# Patient Record
Sex: Male | Born: 1955
Health system: Southern US, Community
[De-identification: ages and names within clinical notes are randomized; demographics above are authoritative.]

## PROBLEM LIST (undated history)

## (undated) ENCOUNTER — Emergency Department (HOSPITAL_BASED_OUTPATIENT_CLINIC_OR_DEPARTMENT_OTHER): Admission: EM | Payer: PPO

## (undated) DIAGNOSIS — E785 Hyperlipidemia, unspecified: Secondary | ICD-10-CM

## (undated) DIAGNOSIS — I10 Essential (primary) hypertension: Secondary | ICD-10-CM

## (undated) DIAGNOSIS — K861 Other chronic pancreatitis: Secondary | ICD-10-CM

## (undated) DIAGNOSIS — I714 Abdominal aortic aneurysm, without rupture, unspecified: Secondary | ICD-10-CM

## (undated) DIAGNOSIS — K219 Gastro-esophageal reflux disease without esophagitis: Secondary | ICD-10-CM

## (undated) DIAGNOSIS — T7840XA Allergy, unspecified, initial encounter: Secondary | ICD-10-CM

## (undated) DIAGNOSIS — J309 Allergic rhinitis, unspecified: Secondary | ICD-10-CM

## (undated) DIAGNOSIS — F1910 Other psychoactive substance abuse, uncomplicated: Secondary | ICD-10-CM

## (undated) DIAGNOSIS — F419 Anxiety disorder, unspecified: Secondary | ICD-10-CM

## (undated) DIAGNOSIS — Z86010 Personal history of colonic polyps: Secondary | ICD-10-CM

## (undated) DIAGNOSIS — B182 Chronic viral hepatitis C: Secondary | ICD-10-CM

## (undated) DIAGNOSIS — E291 Testicular hypofunction: Secondary | ICD-10-CM

## (undated) DIAGNOSIS — Z860101 Personal history of adenomatous and serrated colon polyps: Secondary | ICD-10-CM

## (undated) DIAGNOSIS — M1990 Unspecified osteoarthritis, unspecified site: Secondary | ICD-10-CM

## (undated) DIAGNOSIS — N390 Urinary tract infection, site not specified: Secondary | ICD-10-CM

## (undated) HISTORY — DX: Allergy, unspecified, initial encounter: T78.40XA

## (undated) HISTORY — DX: Hyperlipidemia, unspecified: E78.5

## (undated) HISTORY — DX: Personal history of colonic polyps: Z86.010

## (undated) HISTORY — DX: Essential (primary) hypertension: I10

## (undated) HISTORY — DX: Allergic rhinitis, unspecified: J30.9

## (undated) HISTORY — DX: Other psychoactive substance abuse, uncomplicated: F19.10

## (undated) HISTORY — DX: Anxiety disorder, unspecified: F41.9

## (undated) HISTORY — PX: APPENDECTOMY: SHX54

## (undated) HISTORY — DX: Other chronic pancreatitis: K86.1

## (undated) HISTORY — DX: Personal history of adenomatous and serrated colon polyps: Z86.0101

## (undated) HISTORY — DX: Urinary tract infection, site not specified: N39.0

## (undated) HISTORY — DX: Chronic viral hepatitis C: B18.2

## (undated) HISTORY — DX: Unspecified osteoarthritis, unspecified site: M19.90

## (undated) HISTORY — DX: Testicular hypofunction: E29.1

---
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
## 1998-10-08 ENCOUNTER — Ambulatory Visit (HOSPITAL_COMMUNITY): Admission: RE | Admit: 1998-10-08 | Discharge: 1998-10-08 | Payer: Self-pay | Admitting: Gastroenterology

## 1998-10-25 ENCOUNTER — Encounter: Payer: Self-pay | Admitting: Gastroenterology

## 1998-10-25 ENCOUNTER — Ambulatory Visit (HOSPITAL_COMMUNITY): Admission: RE | Admit: 1998-10-25 | Discharge: 1998-10-25 | Payer: Self-pay | Admitting: Gastroenterology

## 2002-09-25 ENCOUNTER — Inpatient Hospital Stay (HOSPITAL_COMMUNITY): Admission: EM | Admit: 2002-09-25 | Discharge: 2002-09-26 | Payer: Self-pay | Admitting: Emergency Medicine

## 2002-09-25 ENCOUNTER — Encounter: Payer: Self-pay | Admitting: Emergency Medicine

## 2002-09-26 ENCOUNTER — Encounter (INDEPENDENT_AMBULATORY_CARE_PROVIDER_SITE_OTHER): Payer: Self-pay

## 2002-12-30 ENCOUNTER — Emergency Department (HOSPITAL_COMMUNITY): Admission: EM | Admit: 2002-12-30 | Discharge: 2002-12-30 | Payer: Self-pay | Admitting: Emergency Medicine

## 2003-05-30 ENCOUNTER — Inpatient Hospital Stay (HOSPITAL_COMMUNITY): Admission: EM | Admit: 2003-05-30 | Discharge: 2003-06-01 | Payer: Self-pay | Admitting: Emergency Medicine

## 2003-05-31 HISTORY — PX: NM MYOVIEW LTD: HXRAD82

## 2003-06-01 HISTORY — PX: LEFT HEART CATH AND CORONARY ANGIOGRAPHY: CATH118249

## 2003-06-05 ENCOUNTER — Encounter: Admission: RE | Admit: 2003-06-05 | Discharge: 2003-06-05 | Payer: Self-pay | Admitting: Cardiology

## 2003-06-19 ENCOUNTER — Encounter: Payer: Self-pay | Admitting: Internal Medicine

## 2004-07-11 ENCOUNTER — Encounter: Admission: RE | Admit: 2004-07-11 | Discharge: 2004-07-11 | Payer: Self-pay | Admitting: Family Medicine

## 2004-09-12 ENCOUNTER — Ambulatory Visit (HOSPITAL_COMMUNITY): Admission: RE | Admit: 2004-09-12 | Discharge: 2004-09-12 | Payer: Self-pay | Admitting: *Deleted

## 2004-09-12 ENCOUNTER — Encounter: Payer: Self-pay | Admitting: Internal Medicine

## 2005-01-10 ENCOUNTER — Ambulatory Visit (HOSPITAL_COMMUNITY): Admission: RE | Admit: 2005-01-10 | Discharge: 2005-01-10 | Payer: Self-pay | Admitting: *Deleted

## 2005-01-10 ENCOUNTER — Encounter (INDEPENDENT_AMBULATORY_CARE_PROVIDER_SITE_OTHER): Payer: Self-pay | Admitting: *Deleted

## 2005-12-31 ENCOUNTER — Ambulatory Visit: Payer: Self-pay | Admitting: Family Medicine

## 2006-08-13 DIAGNOSIS — F419 Anxiety disorder, unspecified: Secondary | ICD-10-CM | POA: Insufficient documentation

## 2006-08-13 DIAGNOSIS — F411 Generalized anxiety disorder: Secondary | ICD-10-CM

## 2006-08-13 DIAGNOSIS — B182 Chronic viral hepatitis C: Secondary | ICD-10-CM | POA: Insufficient documentation

## 2006-08-19 ENCOUNTER — Ambulatory Visit: Payer: Self-pay | Admitting: Family Medicine

## 2006-08-19 LAB — CONVERTED CEMR LAB
BUN: 11 mg/dL (ref 6–23)
CO2: 31 meq/L (ref 19–32)
Calcium: 9.5 mg/dL (ref 8.4–10.5)
Chloride: 99 meq/L (ref 96–112)
GFR calc non Af Amer: 109 mL/min
Glucose, Bld: 122 mg/dL — ABNORMAL HIGH (ref 70–99)
Ketones, ur: NEGATIVE mg/dL
Nitrite: NEGATIVE
Specific Gravity, Urine: 1.013 (ref 1.005–1.03)
Urobilinogen, UA: 0.2 (ref 0.0–1.0)
pH: 6 (ref 5.0–8.0)

## 2006-09-04 ENCOUNTER — Encounter (INDEPENDENT_AMBULATORY_CARE_PROVIDER_SITE_OTHER): Payer: Self-pay | Admitting: Family Medicine

## 2006-09-04 ENCOUNTER — Ambulatory Visit: Payer: Self-pay | Admitting: Family Medicine

## 2006-09-04 ENCOUNTER — Encounter: Payer: Self-pay | Admitting: Family Medicine

## 2006-09-04 DIAGNOSIS — E785 Hyperlipidemia, unspecified: Secondary | ICD-10-CM | POA: Insufficient documentation

## 2006-09-04 DIAGNOSIS — I10 Essential (primary) hypertension: Secondary | ICD-10-CM | POA: Insufficient documentation

## 2006-09-15 LAB — CONVERTED CEMR LAB
Cholesterol: 192 mg/dL (ref 0–200)
HDL: 34.5 mg/dL — ABNORMAL LOW (ref >39.0)
Triglycerides: 141 mg/dL (ref 0–149)
VLDL: 28 mg/dL (ref 0–40)

## 2007-04-29 ENCOUNTER — Ambulatory Visit: Payer: Self-pay | Admitting: Family Medicine

## 2007-04-29 LAB — CONVERTED CEMR LAB
BUN: 14 mg/dL (ref 6–23)
Cholesterol: 209 mg/dL (ref 0–200)
Direct LDL: 146.4 mg/dL
GFR calc Af Amer: 91 mL/min
GFR calc non Af Amer: 75 mL/min
HDL: 30.9 mg/dL — ABNORMAL LOW (ref >39.0)
Potassium: 4.6 meq/L (ref 3.5–5.1)
Sodium: 138 meq/L (ref 135–145)
Total CHOL/HDL Ratio: 6.8
Triglycerides: 146 mg/dL (ref 0–149)
VLDL: 29 mg/dL (ref 0–40)

## 2007-04-30 ENCOUNTER — Encounter (INDEPENDENT_AMBULATORY_CARE_PROVIDER_SITE_OTHER): Payer: Self-pay | Admitting: *Deleted

## 2007-05-24 ENCOUNTER — Ambulatory Visit: Payer: Self-pay | Admitting: Family Medicine

## 2007-06-09 ENCOUNTER — Ambulatory Visit: Payer: Self-pay | Admitting: Family Medicine

## 2007-06-18 ENCOUNTER — Telehealth (INDEPENDENT_AMBULATORY_CARE_PROVIDER_SITE_OTHER): Payer: Self-pay | Admitting: Family Medicine

## 2007-07-06 ENCOUNTER — Ambulatory Visit: Payer: Self-pay | Admitting: Family Medicine

## 2007-07-18 LAB — CONVERTED CEMR LAB
ALT: 109 units/L — ABNORMAL HIGH (ref 0–53)
AST: 100 units/L — ABNORMAL HIGH (ref 0–37)
Alkaline Phosphatase: 72 units/L (ref 39–117)
BUN: 14 mg/dL (ref 6–23)
Basophils Relative: 2 % — ABNORMAL HIGH (ref 0.0–1.0)
CO2: 36 meq/L — ABNORMAL HIGH (ref 19–32)
Calcium: 9.5 mg/dL (ref 8.4–10.5)
Chloride: 100 meq/L (ref 96–112)
Eosinophils Relative: 1.1 % (ref 0.0–5.0)
GFR calc Af Amer: 101 mL/min
GFR calc non Af Amer: 84 mL/min
Glucose, Bld: 96 mg/dL (ref 70–99)
Lymphocytes Relative: 31.7 % (ref 12.0–46.0)
Monocytes Relative: 7.5 % (ref 3.0–11.0)
Neutro Abs: 5.4 10*3/uL (ref 1.4–7.7)
Platelets: 181 10*3/uL (ref 150–400)
RBC: 5.11 M/uL (ref 4.22–5.81)
Total Protein: 8.3 g/dL (ref 6.0–8.3)
WBC: 9.3 10*3/uL (ref 4.5–10.5)

## 2007-07-19 ENCOUNTER — Telehealth (INDEPENDENT_AMBULATORY_CARE_PROVIDER_SITE_OTHER): Payer: Self-pay | Admitting: *Deleted

## 2007-10-12 ENCOUNTER — Encounter: Admission: RE | Admit: 2007-10-12 | Discharge: 2007-10-12 | Payer: Self-pay | Admitting: Allergy and Immunology

## 2007-12-29 ENCOUNTER — Ambulatory Visit: Payer: Self-pay | Admitting: Internal Medicine

## 2008-05-24 ENCOUNTER — Telehealth (INDEPENDENT_AMBULATORY_CARE_PROVIDER_SITE_OTHER): Payer: Self-pay | Admitting: *Deleted

## 2008-08-23 ENCOUNTER — Ambulatory Visit: Payer: Self-pay | Admitting: Internal Medicine

## 2008-08-29 LAB — CONVERTED CEMR LAB
ALT: 61 units/L — ABNORMAL HIGH (ref 0–53)
BUN: 11 mg/dL (ref 6–23)
Basophils Absolute: 0 10*3/uL (ref 0.0–0.1)
Chloride: 106 meq/L (ref 96–112)
Cholesterol: 166 mg/dL (ref 0–200)
Creatinine, Ser: 0.8 mg/dL (ref 0.4–1.5)
Eosinophils Absolute: 0 10*3/uL (ref 0.0–0.7)
Glucose, Bld: 107 mg/dL — ABNORMAL HIGH (ref 70–99)
HCT: 46.8 % (ref 39.0–52.0)
HDL: 32.5 mg/dL — ABNORMAL LOW (ref >39.00)
LDL Cholesterol: 102 mg/dL — ABNORMAL HIGH (ref 0–99)
Lymphs Abs: 1.7 10*3/uL (ref 0.7–4.0)
MCV: 96.5 fL (ref 78.0–100.0)
Monocytes Absolute: 0.6 10*3/uL (ref 0.1–1.0)
Neutrophils Relative %: 70.2 % (ref 43.0–77.0)
Platelets: 162 10*3/uL (ref 150.0–400.0)
Potassium: 4.4 meq/L (ref 3.5–5.1)
RDW: 12.2 % (ref 11.5–14.6)
Total Bilirubin: 0.7 mg/dL (ref 0.3–1.2)
Triglycerides: 160 mg/dL — ABNORMAL HIGH (ref 0.0–149.0)
VLDL: 32 mg/dL (ref 0.0–40.0)

## 2008-11-27 ENCOUNTER — Ambulatory Visit: Payer: Self-pay | Admitting: Internal Medicine

## 2009-03-14 ENCOUNTER — Telehealth (INDEPENDENT_AMBULATORY_CARE_PROVIDER_SITE_OTHER): Payer: Self-pay | Admitting: *Deleted

## 2009-03-27 ENCOUNTER — Ambulatory Visit: Payer: Self-pay | Admitting: Internal Medicine

## 2009-03-27 DIAGNOSIS — L719 Rosacea, unspecified: Secondary | ICD-10-CM | POA: Insufficient documentation

## 2009-03-29 ENCOUNTER — Telehealth: Payer: Self-pay | Admitting: Internal Medicine

## 2009-03-29 ENCOUNTER — Telehealth (INDEPENDENT_AMBULATORY_CARE_PROVIDER_SITE_OTHER): Payer: Self-pay | Admitting: *Deleted

## 2009-03-29 LAB — CONVERTED CEMR LAB
ALT: 98 units/L — ABNORMAL HIGH (ref 0–53)
AST: 106 units/L — ABNORMAL HIGH (ref 0–37)
Basophils Absolute: 0.1 10*3/uL (ref 0.0–0.1)
Basophils Relative: 0.8 % (ref 0.0–3.0)
Calcium: 9 mg/dL (ref 8.4–10.5)
Eosinophils Relative: 0.8 % (ref 0.0–5.0)
Glucose, Bld: 92 mg/dL (ref 70–99)
HCT: 47 % (ref 39.0–52.0)
MCHC: 34.4 g/dL (ref 30.0–36.0)
MCV: 97.5 fL (ref 78.0–100.0)
Monocytes Absolute: 0.7 10*3/uL (ref 0.1–1.0)
Neutro Abs: 5.3 10*3/uL (ref 1.4–7.7)
Neutrophils Relative %: 58 % (ref 43.0–77.0)
RBC: 4.82 M/uL (ref 4.22–5.81)
Sodium: 137 meq/L (ref 135–145)
TSH: 1.46 microintl units/mL (ref 0.35–5.50)
Total Bilirubin: 0.8 mg/dL (ref 0.3–1.2)
Total Protein: 7.6 g/dL (ref 6.0–8.3)
Vitamin B-12: 1029 pg/mL — ABNORMAL HIGH (ref 211–911)
WBC: 9.2 10*3/uL (ref 4.5–10.5)

## 2009-04-06 ENCOUNTER — Encounter: Payer: Self-pay | Admitting: Internal Medicine

## 2009-04-18 ENCOUNTER — Telehealth (INDEPENDENT_AMBULATORY_CARE_PROVIDER_SITE_OTHER): Payer: Self-pay | Admitting: *Deleted

## 2009-04-21 HISTORY — PX: COLONOSCOPY: SHX174

## 2009-04-21 HISTORY — PX: PERCUTANEOUS LIVER BIOPSY: SUR136

## 2009-05-25 ENCOUNTER — Ambulatory Visit: Payer: Self-pay | Admitting: Internal Medicine

## 2009-06-19 ENCOUNTER — Encounter: Payer: Self-pay | Admitting: Internal Medicine

## 2009-06-25 ENCOUNTER — Encounter: Payer: Self-pay | Admitting: Internal Medicine

## 2009-06-28 ENCOUNTER — Telehealth (INDEPENDENT_AMBULATORY_CARE_PROVIDER_SITE_OTHER): Payer: Self-pay | Admitting: *Deleted

## 2009-06-30 ENCOUNTER — Emergency Department (HOSPITAL_COMMUNITY): Admission: EM | Admit: 2009-06-30 | Discharge: 2009-06-30 | Payer: Self-pay | Admitting: Emergency Medicine

## 2009-06-30 LAB — CONVERTED CEMR LAB
Albumin: 3.5 g/dL
BUN: 10 mg/dL
CO2, serum: 30 mmol/L
Chloride, Serum: 102 mmol/L
Glucose, Urine, Semiquant: NEGATIVE
Lipase: 65 units/L
MCV: 97.9 fL
Potassium, serum: 4.2 mmol/L
Protein, U semiquant: NEGATIVE
RBC count: 4.67 10*6/uL
RBC, Urine, dipstick: NEGATIVE
Sodium, serum: 138 mmol/L
Specific Gravity, Urine: 1.015
pH: 6.5

## 2009-07-03 ENCOUNTER — Ambulatory Visit: Payer: Self-pay | Admitting: Internal Medicine

## 2009-07-26 ENCOUNTER — Encounter: Payer: Self-pay | Admitting: Internal Medicine

## 2009-07-26 ENCOUNTER — Ambulatory Visit: Payer: Self-pay | Admitting: Gastroenterology

## 2009-11-21 ENCOUNTER — Ambulatory Visit (HOSPITAL_COMMUNITY): Admission: RE | Admit: 2009-11-21 | Discharge: 2009-11-21 | Payer: Self-pay | Admitting: Gastroenterology

## 2009-12-07 ENCOUNTER — Ambulatory Visit: Payer: Self-pay | Admitting: Internal Medicine

## 2009-12-07 LAB — CONVERTED CEMR LAB: Testosterone: 273.22 ng/dL — ABNORMAL LOW (ref 350–890)

## 2009-12-10 ENCOUNTER — Encounter: Payer: Self-pay | Admitting: Internal Medicine

## 2009-12-10 LAB — CONVERTED CEMR LAB
LH: 7.7 milliintl units/mL (ref 1.5–9.3)
Prolactin: 6.8 ng/mL (ref 2.1–17.1)

## 2009-12-18 ENCOUNTER — Telehealth: Payer: Self-pay | Admitting: Internal Medicine

## 2009-12-18 DIAGNOSIS — E291 Testicular hypofunction: Secondary | ICD-10-CM | POA: Insufficient documentation

## 2009-12-19 ENCOUNTER — Encounter: Payer: Self-pay | Admitting: Internal Medicine

## 2009-12-21 ENCOUNTER — Encounter (INDEPENDENT_AMBULATORY_CARE_PROVIDER_SITE_OTHER): Payer: Self-pay | Admitting: *Deleted

## 2009-12-27 ENCOUNTER — Encounter: Payer: Self-pay | Admitting: Internal Medicine

## 2010-01-19 DIAGNOSIS — E291 Testicular hypofunction: Secondary | ICD-10-CM

## 2010-01-19 HISTORY — DX: Testicular hypofunction: E29.1

## 2010-01-21 ENCOUNTER — Encounter: Payer: Self-pay | Admitting: Internal Medicine

## 2010-01-24 ENCOUNTER — Telehealth: Payer: Self-pay | Admitting: Internal Medicine

## 2010-01-30 ENCOUNTER — Encounter (INDEPENDENT_AMBULATORY_CARE_PROVIDER_SITE_OTHER): Payer: Self-pay | Admitting: *Deleted

## 2010-02-05 ENCOUNTER — Telehealth: Payer: Self-pay | Admitting: Internal Medicine

## 2010-02-06 ENCOUNTER — Encounter: Payer: Self-pay | Admitting: Internal Medicine

## 2010-02-07 ENCOUNTER — Encounter (INDEPENDENT_AMBULATORY_CARE_PROVIDER_SITE_OTHER): Payer: Self-pay | Admitting: *Deleted

## 2010-02-08 ENCOUNTER — Ambulatory Visit (HOSPITAL_COMMUNITY): Admission: RE | Admit: 2010-02-08 | Discharge: 2010-02-08 | Payer: Self-pay | Admitting: Gastroenterology

## 2010-02-08 ENCOUNTER — Encounter: Payer: Self-pay | Admitting: Internal Medicine

## 2010-02-19 ENCOUNTER — Telehealth: Payer: Self-pay | Admitting: Internal Medicine

## 2010-02-19 ENCOUNTER — Ambulatory Visit: Payer: Self-pay | Admitting: Internal Medicine

## 2010-02-19 DIAGNOSIS — N390 Urinary tract infection, site not specified: Secondary | ICD-10-CM

## 2010-02-19 HISTORY — DX: Urinary tract infection, site not specified: N39.0

## 2010-02-19 LAB — CONVERTED CEMR LAB
AST: 73 units/L — ABNORMAL HIGH (ref 0–37)
Albumin: 2.2 g/dL — ABNORMAL LOW (ref 3.5–5.2)
BUN: 24 mg/dL — ABNORMAL HIGH (ref 6–23)
Basophils Absolute: 0 10*3/uL (ref 0.0–0.1)
Basophils Relative: 0.2 % (ref 0.0–3.0)
Bilirubin, Direct: 1.5 mg/dL — ABNORMAL HIGH (ref 0.0–0.3)
CO2: 29 meq/L (ref 19–32)
Chloride: 100 meq/L (ref 96–112)
Eosinophils Absolute: 0 10*3/uL (ref 0.0–0.7)
Glucose, Bld: 82 mg/dL (ref 70–99)
Hemoglobin: 12.2 g/dL — ABNORMAL LOW (ref 13.0–17.0)
Lymphocytes Relative: 10.3 % — ABNORMAL LOW (ref 12.0–46.0)
Lymphs Abs: 1.9 10*3/uL (ref 0.7–4.0)
Monocytes Relative: 4.8 % (ref 3.0–12.0)
Neutro Abs: 15.6 10*3/uL — ABNORMAL HIGH (ref 1.4–7.7)
Neutrophils Relative %: 84.6 % — ABNORMAL HIGH (ref 43.0–77.0)
Potassium: 4.5 meq/L (ref 3.5–5.1)
RDW: 14.1 % (ref 11.5–14.6)
Sodium: 136 meq/L (ref 135–145)
Total Protein: 6.8 g/dL (ref 6.0–8.3)
WBC: 18.5 10*3/uL (ref 4.5–10.5)

## 2010-02-20 ENCOUNTER — Ambulatory Visit: Payer: Self-pay | Admitting: Internal Medicine

## 2010-02-20 LAB — CONVERTED CEMR LAB
Casts: NONE SEEN /lpf
Crystals: NONE SEEN

## 2010-02-21 ENCOUNTER — Encounter: Payer: Self-pay | Admitting: Internal Medicine

## 2010-02-28 ENCOUNTER — Telehealth: Payer: Self-pay | Admitting: Internal Medicine

## 2010-03-04 ENCOUNTER — Telehealth: Payer: Self-pay | Admitting: Internal Medicine

## 2010-04-04 ENCOUNTER — Ambulatory Visit: Payer: Self-pay | Admitting: Gastroenterology

## 2010-04-04 ENCOUNTER — Encounter: Payer: Self-pay | Admitting: Internal Medicine

## 2010-04-18 ENCOUNTER — Telehealth: Payer: Self-pay | Admitting: Internal Medicine

## 2010-04-25 ENCOUNTER — Ambulatory Visit
Admission: RE | Admit: 2010-04-25 | Discharge: 2010-04-25 | Payer: Self-pay | Source: Home / Self Care | Attending: Internal Medicine | Admitting: Internal Medicine

## 2010-04-25 ENCOUNTER — Other Ambulatory Visit: Payer: Self-pay | Admitting: Internal Medicine

## 2010-04-25 DIAGNOSIS — N390 Urinary tract infection, site not specified: Secondary | ICD-10-CM | POA: Insufficient documentation

## 2010-04-25 LAB — CBC WITH DIFFERENTIAL/PLATELET
Basophils Absolute: 0 10*3/uL (ref 0.0–0.1)
Basophils Relative: 0.4 % (ref 0.0–3.0)
Eosinophils Absolute: 0.1 10*3/uL (ref 0.0–0.7)
Eosinophils Relative: 1.2 % (ref 0.0–5.0)
HCT: 45.3 % (ref 39.0–52.0)
Hemoglobin: 15.5 g/dL (ref 13.0–17.0)
Lymphocytes Relative: 36.6 % (ref 12.0–46.0)
Lymphs Abs: 2.8 10*3/uL (ref 0.7–4.0)
MCHC: 34.3 g/dL (ref 30.0–36.0)
MCV: 98.6 fl (ref 78.0–100.0)
Monocytes Absolute: 0.7 10*3/uL (ref 0.1–1.0)
Monocytes Relative: 9.1 % (ref 3.0–12.0)
Neutro Abs: 4 10*3/uL (ref 1.4–7.7)
Neutrophils Relative %: 52.7 % (ref 43.0–77.0)
Platelets: 158 10*3/uL (ref 150.0–400.0)
RBC: 4.6 Mil/uL (ref 4.22–5.81)
RDW: 13.6 % (ref 11.5–14.6)
WBC: 7.7 10*3/uL (ref 4.5–10.5)

## 2010-04-25 LAB — CONVERTED CEMR LAB
Nitrite: NEGATIVE
Specific Gravity, Urine: 1.015
Urobilinogen, UA: 0.2

## 2010-04-26 ENCOUNTER — Encounter: Payer: Self-pay | Admitting: Internal Medicine

## 2010-04-26 LAB — PSA: PSA: 0.06 ng/mL — ABNORMAL LOW (ref 0.10–4.00)

## 2010-04-26 LAB — CONVERTED CEMR LAB
Casts: NONE SEEN /lpf
Squamous Epithelial / LPF: NONE SEEN /lpf

## 2010-04-26 LAB — TESTOSTERONE: Testosterone: 532.86 ng/dL (ref 350.00–890.00)

## 2010-05-01 ENCOUNTER — Telehealth: Payer: Self-pay | Admitting: Internal Medicine

## 2010-05-12 ENCOUNTER — Encounter: Payer: Self-pay | Admitting: *Deleted

## 2010-05-21 NOTE — Letter (Signed)
Summary: Medical Specialty Services  Medical Specialty Services   Imported By: Lanelle Bal 01/31/2010 08:27:52  _____________________________________________________________________  External Attachment:    Type:   Image     Comment:   External Document

## 2010-05-21 NOTE — Letter (Signed)
Summary: Unable To Reach-Consult Scheduled  [facility] [place]  [address] [place], [place] [address]   Phone: [phone]  Fax: [phone]    01/30/2010 MRN: [id number]    Dear Mr. MANGAN,   We have been unable to reach you by phone.  Please contact our office with an updated phone number.      Thank you,  Harold Barban  at [employer]

## 2010-05-21 NOTE — Progress Notes (Signed)
Summary: hypogonadism (lmom 8/31, 9/1, 9/6)  Phone Note Outgoing Call   Summary of Call: testosterone slightly low, FSH elevated. 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 plan: -Notify patient that his testosterone is low -he may benefit from testosterone replacement ;  if he is interested on the treatment, I will have to discuss that  with Dr. Rolanda Lundborg ( hepatitis C clinic)  to be sure therapy is  okay from their standpoint -Please get a bone density test, dx hypogonadism Keyanna Sandefer E. Gwendolen Hewlett MD  December 18, 2009 12:16 PM     Follow-up for Phone Call        Left message for pt to call back. Army Fossa CMA  December 18, 2009 1:14 PM   Additional Follow-up for Phone Call Additional follow up Details #1::        Pt called back left message on pts work number. Army Fossa CMA  December 19, 2009 8:56 AM   New Problems: HYPOGONADISM (ICD-257.2)   Additional Follow-up for Phone Call Additional follow up Details #2::    Spoke with pt and he would like to know how necessary it is for the Treatment. He would like to know if this could effect his energy level? He states he has not seen Dr.Zachs in a very long time. Army Fossa CMA  December 19, 2009 12:01 PM it may help with his energy level. I will send a letter to Dr. Rolanda Lundborg and ask if it is okay to start testosterone. Timeka Goette E. Jayceon Troy MD  December 19, 2009 1:20 PM   LEft message for pt to call back. Army Fossa CMA  December 19, 2009 3:53 PM  Left message for pt to call back. Army Fossa CMA  December 20, 2009 8:36 AM  Left message for pt to call back. Army Fossa CMA  December 25, 2009 9:44 AM  I spoke with pt he is aware. Army Fossa CMA  December 26, 2009 8:34 AM    New Problems: HYPOGONADISM (ICD-257.2)

## 2010-05-21 NOTE — Consult Note (Signed)
Summary: [facility] CL[facility]  Medical [facility]ty Services   Imported By: Lanelle Bal 08/21/2009 09:29:47  _____________________________________________________________________  External Attachment:    Type:   Image     Comment:   External Document

## 2010-05-21 NOTE — Progress Notes (Signed)
Summary: Androgel PA  Phone Note Refill Request Message from:  Pharmacy on February 05, 2010 8:07 AM  Refills Requested: Medication #1:  ANDROGEL PUMP 1.25 GM/ACT (1%) GEL 4 pumps daily..   Dosage confirmed as above?Dosage Confirmed   Supply Requested: 1 month Prior Auth from [employer] on [address].  [phone]  Next Appointment Scheduled: none Initial call taken by: Harold Barban,  February 05, 2010 8:09 AM  Follow-up for Phone Call        I was directed to call numbers on the patient card, number given above is for imaging only.  ID:  [id number] Follow-up by: Lucious Groves CMA,  February 05, 2010 4:54 PM  Additional Follow-up for Phone Call Additional follow up Details #1::        Patient called this morning to check on the status of the the prior auth for the androgel. I informed him we were working on it today and would try to get it done soon as possible.  Additional Follow-up by: Harold Barban,  February 06, 2010 8:10 AM    Additional Follow-up for Phone Call Additional follow up Details #2::    forms requested. Ramello Cordial CMA  February 06, 2010 8:38 AM   Forms completed and faxed, will await reply from pharmacy. Hence Derrick CMA  February 08, 2010 9:04 AM    Appended Document: Androgel PA Approved until 10/2012.

## 2010-05-21 NOTE — Letter (Signed)
Summary: Appt Scheduled/Medical Specialty Services  Appt Scheduled/Medical Specialty Services   Imported By: Lanelle Bal 06/29/2009 10:18:29  _____________________________________________________________________  External Attachment:    Type:   Image     Comment:   External Document
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
## 2010-05-21 NOTE — Letter (Signed)
Summary: Primary Care Consult Scheduled Letter  [facility] [place]  [address] [place], [place] [address]   Phone: [phone]  Fax: [phone]      12/21/2009 MRN: [id number]  [facility] [address] [place], [place]  [address]    Dear Mr. GUTTMAN,    We have scheduled an appointment for you.  At the recommendation of Dr. Willow Ora, we have scheduled you for a Bone Density Scan at [facility] on 01-04-2010 at 9:00am.  Their address is [address] [place], [place] [address]. The office phone number is [phone].  If this appointment day and time is not convenient for you, please feel free to call the office of the doctor you are being referred to at the number listed above and reschedule the appointment.    It is important for you to keep your scheduled appointments. We are here to make sure you are given good patient care.   Thank you,    Renee, Patient Care Coordinator [facility] [facility]

## 2010-05-21 NOTE — Miscellaneous (Signed)
Summary: flu shot @ [facility]   Immunization History:  Influenza Immunization History:    Influenza:  given @ [facility] (02/06/2010)
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
## 2010-05-21 NOTE — Letter (Signed)
Summary: [facility]  [facility]   Imported By: Lanelle Bal 07/10/2009 11:59:54  _____________________________________________________________________  External Attachment:    Type:   Image     Comment:   External Document

## 2010-05-21 NOTE — Progress Notes (Signed)
Summary: critical lab  Phone Note From Other Clinic   Caller: [facility] Summary of Call: Clydie Braun from [facility] called and stated that pts White count was 18,500.  Initial call taken by: Army Fossa CMA,  February 19, 2010 4:29 PM  Follow-up for Phone Call        full CBC Pending. If WBC elevated, will get a CT abd-pelvis in AM  (abdominal pain after liver biopsy, complications from the procedure?) Matilde Pottenger E. Obe Ahlers MD  February 19, 2010 5:12 PM   Additional Follow-up for Phone Call Additional follow up Details #1::        labs reviewed-- he has an elevated white count, platelets are stable Bilirubin is increased. hemoglobin slightly low I spoke with the patient tonight, I  recommend a CT of the abdomen and pelvis to evaluate the elevated white count. Due to the cost of his medical care he is unable to proceed with that. in view of that, we agreed to: Get a UA, urine culture tomorrow, proceed with the  chest x-ray Recheck a CBC next week, if the WBC continued to be elevated, will proceed with further workup Also pleaase send his lab results to Dr. Rolanda Lundborg, his liver specialist Ciera Beckum E. Urania Pearlman MD  February 19, 2010 6:51 PM     Additional Follow-up for Phone Call Additional follow up Details #2::    faxed to [facility], pt on lab schedule for UA and urine culture.  Follow-up by: Army Fossa CMA,  February 20, 2010 8:27 AM
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
## 2010-05-21 NOTE — Letter (Signed)
Summary: [facility]  [facility]   Imported By: Lanelle Bal 04/23/2009 08:31:50  _____________________________________________________________________  External Attachment:    Type:   Image     Comment:   External Document

## 2010-05-21 NOTE — Progress Notes (Signed)
Summary: okay to start AndroGel (lmom 10/6, 10/7, 10/11, 10/12)  Phone Note Outgoing Call   Summary of Call: advise patient: I received a letter from Dr. Jacqualine Mau, he is okay with a prescription of AndroGel for his low testosterone. If patient is willing to start the treatment, start AndroGel 4 pumps daily. He needs to read and understand instructions on how to use the product or him for a nurse visit for discussion. If he starts androgel , he is to come back in 6 weeks. Jose E. Paz MD  January 24, 2010 1:46 PM   Follow-up for Phone Call        Left message for pt to call back.  Follow-up by: Army Fossa CMA,  January 24, 2010 1:52 PM  Additional Follow-up for Phone Call Additional follow up Details #1::        lmtcb.Harold Barban  January 25, 2010 11:29 AM    Additional Follow-up for Phone Call Additional follow up Details #2::    LMTCB. Army Fossa CMA  January 29, 2010 11:28 AM  lmtcb, letter mailed regaurding phone numbers.Harold Barban  January 30, 2010 9:26 AM   Appended Document: Androgel    Phone Note Call from Patient   Caller: Patient Summary of Call: Spoke with patient and he would like the rx sent in to [facility] on [address] He is going to speak with the pharmacist about something things and will call us with any more questions.  Initial call taken by: Harold Barban,  February 01, 2010 9:00 AM  Follow-up for Phone Call        Pt is unable to come in at this time for a nurse visit to go over Androgel, what strength of Androgel do you want me to send it?  Follow-up by: Army Fossa CMA,  February 01, 2010 9:10 AM  Additional Follow-up for Phone Call Additional follow up Details #1::        Sent in Androgel 1%.  Additional Follow-up by: Army Fossa CMA,  February 04, 2010 4:27 PM    New/Updated Medications: ANDROGEL PUMP 1.25 GM/ACT (1%) GEL (TESTOSTERONE) 4 pumps daily. Prescriptions: ANDROGEL PUMP 1.25 GM/ACT (1%) GEL (TESTOSTERONE) 4  pumps daily.  #1 month x 1   Entered by:   Army Fossa CMA   Authorized by:   Nolon Rod. Paz MD   Signed by:   Army Fossa CMA on 02/04/2010   Method used:   Printed then faxed to ...       [facility]  [address] [phone]* (retail)       [address]       [place], [place]  [address]       Ph: [phone]       Fax: [phone]   RxID:   [id number]

## 2010-05-21 NOTE — Progress Notes (Signed)
Summary: left arm pain/Cipro  Phone Note Call from Patient Call back at Work Phone [phone]   Summary of Call: Patient left message on triage that he has been taking his cipro as prescribed and hos notes that he has a sharp pain in his left arm/shoulder that lasts only a couple of seconds. Patient wonders if this neuropathy due to med and should he continue the med? (has 4 days left). Patient would like to be sure that this will go away and is only a minor issue. Please advise. Initial call taken by: Lucious Groves CMA,  March 04, 2010 8:47 AM  Follow-up for Phone Call        can't tell for sure if the symptoms are related to Cipro. If they are severe discontinue Cipro, if not, the go ahead and finish it Follow-up by: Jose E. Paz MD,  March 04, 2010 11:00 AM  Additional Follow-up for Phone Call Additional follow up Details #1::        Left message on machine to call back to office. Muriel Wilber CMA  March 04, 2010 11:26 AM   Patient notes that the symptoms are not severe and will continue the med. Auston Halfmann CMA  March 04, 2010 4:34 PM
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
## 2010-05-21 NOTE — Miscellaneous (Signed)
Summary: [facility]  [facility]   Imported By: Lanelle Bal 02/18/2010 10:27:16  _____________________________________________________________________  External Attachment:    Type:   Image     Comment:   External Document

## 2010-05-21 NOTE — Letter (Signed)
Summary: letter to Dr. Barbie Banner at [facility]  [address] [place], [place] [address]   Phone: [phone]  Fax: [phone]    12/19/2009   [facility] Dr. Rolanda Lundborg  Ref.Daymein Nunnery DOB February 08, 2056 [address] [place], [place]  [address]     Dear Dr Jacqualine Mau  this letter is in reference to one of our mutual patient Ricardo Fisher, he was recently seen with decreased libido and some ED, he requested a testosterone check. His blood work showed a low testosterone level and increase FSH. My diagnosis is primary gonadal  failure.The patient is interested in hormone replacement therapy and I wonder if you could let me know if we can start him on AndroGel given he is chronic hepatitis C.    Current Medications: 1)  ALPRAZOLAM 0.5 MG  TABS (ALPRAZOLAM) Take one tablet as needed twice daily is not written by Dr. Blossom Hoops 2)  ASPIR-LOW 81 MG TBEC (ASPIRIN) once daily 3)  TOPROL XL 25 MG XR24H-TAB (METOPROLOL SUCCINATE) 1 by mouth once daily 4)  * OTC ANTIHISTIMINE 1/2 by mouth qhs   Past Medical History: 1)  Hyperlipidemia 2)  Hypertension 3)  CARRIER, VIRAL HEPATITIS C  (h/o blood transfusion); s/p 2 liver Bx--second one ( aprox 2004) was stable; does have chronic increased LFTs 4)  ANXIETY   5)  2006  Cscope (Dr Luther Parody) Normal, EGD gastritis-duodenitis ,    Pertinent Labs:  we'll enclose his testosterone, FSH and LH results  Thank you again for your help, I can be contacted at [phone] or via fax [phone]  Sincerely,      Ricardo Minniefield E. Elonna Mcfarlane MD  Appended Document: letter to Dr. Rolanda Lundborg Faxed to [facility].

## 2010-05-21 NOTE — Progress Notes (Signed)
Summary: wants to get off bp meds/left msg to call  Phone Note Call from Patient Call back at Home Phone [phone]   Caller: Patient Summary of Call: pt called having signs of rash on stomach says has been having for some time, ongoing muscle weakness,  -taking toprol Xl 25 mg 1 a day ,and Hctz 1/2 a day,  read  on pamphlet these meds can cause this signs, (says rash is debilitating, pt  did not address at ov 05/25/09 --Wanted to know can he wean himself off since lost weight lost about 40 pounds.  Initial call taken by: Kandice Hams,  June 28, 2009 5:14 PM  Follow-up for Phone Call        recommend to discontinue hydrochlorothiazide and call if her BPs goal higher than 140/80. Rash : OV if  we are going to evaluate  her for that Follow-up by: Jose E. Paz MD,  June 29, 2009 2:18 PM  Additional Follow-up for Phone Call Additional follow up Details #1::        left msg for pt to call .Kandice Hams  June 29, 2009 3:44 PM Called left msg Dr Drue Novel recommendations if questions give the office a call .Kandice Hams  July 02, 2009 11:16 AM   Additional Follow-up by: Kandice Hams,  July 02, 2009 11:16 AM

## 2010-05-21 NOTE — Medication Information (Signed)
Summary: Prior Authorization & Approval for Androgel/[employer]  Prior Authorization & Approval for Androgel/[employer]   Imported By: Lanelle Bal 02/18/2010 12:56:49  _____________________________________________________________________  External Attachment:    Type:   Image     Comment:   External Document

## 2010-05-21 NOTE — Progress Notes (Signed)
Summary: Records received  Phone Note From Other Clinic   Summary of Call: I received a call from Selena Batten at [facility] and they received labs on this patient. He currently is not a patient there so she will shred the records. Should this patient be referred to them? Please advise. Initial call taken by: Lucious Groves CMA,  February 28, 2010 10:22 AM  Follow-up for Phone Call        Faxed them to this MD alternate location at [facility]. Follow-up by: Lucious Groves CMA,  February 28, 2010 11:16 AM

## 2010-05-23 NOTE — Progress Notes (Signed)
Summary: due labs   Phone Note Outgoing Call   Summary of Call: hes is over due for a UCX and CBC , dx UTI please arrange Jose E. Paz MD  April 18, 2010 2:32 PM   Follow-up for Phone Call        left message for pt to call back. Army Fossa CMA  April 18, 2010 2:47 PM   Additional Follow-up for Phone Call Additional follow up Details #1::        left message for pt to call back. Army Fossa CMA  April 19, 2010 3:34 PM     Additional Follow-up for Phone Call Additional follow up Details #2::    Pt has appt scheduled for Thursday. Army Fossa CMA  April 23, 2010 4:08 PM
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
## 2010-05-23 NOTE — Progress Notes (Signed)
Summary: Refil Request  Phone Note Refill Request Message from:  Pharmacy on May 01, 2010 1:13 PM  Refills Requested: Medication #1:  ANDROGEL PUMP 1.25 GM/ACT (1%) GEL 3 pumps daily.   Dosage confirmed as above?Dosage Confirmed   Supply Requested: 3 months   Notes: Patient has a coupon for a free dose of 1.62% if we would change the rx for him. He said if it is a big deal don't worry about it.  Medication #2:  VALTREX 1 GM TABS 2 by mouth twice a day for one day (for each episode of cold sores ).   Dosage confirmed as above?Dosage Confirmed   Supply Requested: 3 months   Last Refilled: 02/19/2010 [facility] on [address]   Next Appointment Scheduled: none Initial call taken by: Harold Barban,  May 01, 2010 1:13 PM  Follow-up for Phone Call        Please see the note under the androgel, and advise if okay. Army Fossa CMA  May 01, 2010 1:17 PM   Additional Follow-up for Phone Call Additional follow up Details #1::        change androgel to 1.62% only 2 pumps a day, ok 3 month supply. needs testoreone level rechecked in 2 months ok valtrex 60, 1 RF  Additional Follow-up by: Jose E. Paz MD,  May 02, 2010 12:34 PM    New/Updated Medications: ANDROGEL PUMP 20.25 MG/ACT (1.62%) GEL (TESTOSTERONE) 2 pumps per day. Prescriptions: VALTREX 1 GM TABS (VALACYCLOVIR HCL) 2 by mouth twice a day for one day (for each episode of cold sores )  #60 x 1   Entered by:   Army Fossa CMA   Authorized by:   Nolon Rod. Paz MD   Signed by:   Army Fossa CMA on 05/02/2010   Method used:   Printed then faxed to ...       [facility]  [address] [phone]* (retail)       [address]       [place], [place]  [address]       Ph: [phone]       Fax: [phone]   RxID:   [id number] ANDROGEL PUMP 20.25 MG/ACT (1.62%) GEL (TESTOSTERONE) 2 pumps per day.  #3 month x 0   Entered by:   Army Fossa CMA   Authorized by:   Nolon Rod. Paz MD   Signed by:   Army Fossa CMA on  05/02/2010   Method used:   Printed then faxed to ...       [facility]  [address] [phone]* (retail)       [address]       [place], [place]  [address]       Ph: [phone]       Fax: [phone]   RxID:   [id number]

## 2010-05-23 NOTE — Consult Note (Signed)
Summary: planning treatment for hepatitis C.---[facility]   Imported By: Lanelle Bal 04/26/2010 10:42:12  _____________________________________________________________________  External Attachment:    Type:   Image     Comment:   External Document

## 2010-06-06 ENCOUNTER — Encounter: Payer: Self-pay | Admitting: Internal Medicine

## 2010-06-27 NOTE — Letter (Signed)
Summary: next Cscope 2016--- Gastroenterology  [facility]   Imported By: Maryln Gottron 06/13/2010 08:24:33  _____________________________________________________________________  External Attachment:    Type:   Image     Comment:   External Document

## 2010-07-03 LAB — CBC
Hemoglobin: 16.1 g/dL (ref 13.0–17.0)
Platelets: 163 10*3/uL (ref 150–400)
RBC: 4.87 MIL/uL (ref 4.22–5.81)

## 2010-07-03 LAB — PROTIME-INR
INR: 0.97 (ref 0.00–1.49)
Prothrombin Time: 13.1 seconds (ref 11.6–15.2)

## 2010-07-14 LAB — URINALYSIS, ROUTINE W REFLEX MICROSCOPIC
Glucose, UA: NEGATIVE mg/dL
Hgb urine dipstick: NEGATIVE
Ketones, ur: NEGATIVE mg/dL
Nitrite: NEGATIVE
Protein, ur: NEGATIVE mg/dL
Urobilinogen, UA: 0.2 mg/dL (ref 0.0–1.0)

## 2010-07-14 LAB — COMPREHENSIVE METABOLIC PANEL
AST: 78 U/L — ABNORMAL HIGH (ref 0–37)
Albumin: 3.5 g/dL (ref 3.5–5.2)
BUN: 10 mg/dL (ref 6–23)
Calcium: 9.1 mg/dL (ref 8.4–10.5)
Chloride: 102 mEq/L (ref 96–112)
Creatinine, Ser: 0.78 mg/dL (ref 0.4–1.5)
GFR calc Af Amer: >60 mL/min (ref >60)
Total Bilirubin: 0.7 mg/dL (ref 0.3–1.2)
Total Protein: 7.7 g/dL (ref 6.0–8.3)

## 2010-07-14 LAB — CBC
HCT: 45.7 % (ref 39.0–52.0)
MCV: 97.9 fL (ref 78.0–100.0)
Platelets: 160 10*3/uL (ref 150–400)
RDW: 12.7 % (ref 11.5–15.5)
WBC: 9.3 10*3/uL (ref 4.0–10.5)

## 2010-07-14 LAB — DIFFERENTIAL
Basophils Absolute: 0.1 10*3/uL (ref 0.0–0.1)
Eosinophils Relative: 1 % (ref 0–5)
Lymphocytes Relative: 41 % (ref 12–46)
Lymphs Abs: 3.8 10*3/uL (ref 0.7–4.0)
Monocytes Absolute: 0.7 10*3/uL (ref 0.1–1.0)
Monocytes Relative: 8 % (ref 3–12)
Neutro Abs: 4.6 10*3/uL (ref 1.7–7.7)

## 2010-08-05 ENCOUNTER — Ambulatory Visit (INDEPENDENT_AMBULATORY_CARE_PROVIDER_SITE_OTHER): Payer: BC Managed Care – PPO | Admitting: Internal Medicine

## 2010-08-05 ENCOUNTER — Encounter: Payer: Self-pay | Admitting: Internal Medicine

## 2010-08-05 VITALS — BP 140/88 | HR 90 | Temp 98.1°F | Wt 182.4 lb

## 2010-08-05 DIAGNOSIS — E291 Testicular hypofunction: Secondary | ICD-10-CM

## 2010-08-05 DIAGNOSIS — B182 Chronic viral hepatitis C: Secondary | ICD-10-CM

## 2010-08-05 DIAGNOSIS — I10 Essential (primary) hypertension: Secondary | ICD-10-CM

## 2010-08-05 MED ORDER — TESTOSTERONE 12.5 MG/ACT (1%) TD GEL: 3.0000 | TRANSDERMAL | Status: DC

## 2010-08-05 MED ORDER — METOPROLOL SUCCINATE ER 25 MG PO TB24: 25.0000 mg | ORAL_TABLET | Freq: Every day | ORAL | Status: DC
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
## 2010-08-05 NOTE — Assessment & Plan Note (Signed)
no change, RF

## 2010-08-05 NOTE — Assessment & Plan Note (Addendum)
Switched from 1% to 1.62% a month ago, not feeling well since that change. Plan: Go back to the 1% formulation. Labs

## 2010-08-05 NOTE — Assessment & Plan Note (Signed)
Labs

## 2010-08-06 ENCOUNTER — Other Ambulatory Visit: Payer: Self-pay | Admitting: *Deleted

## 2010-08-06 LAB — HEPATIC FUNCTION PANEL
ALT: 123 U/L — ABNORMAL HIGH (ref 0–53)
Bilirubin, Direct: 0.1 mg/dL (ref 0.0–0.3)
Total Bilirubin: 0.5 mg/dL (ref 0.3–1.2)

## 2010-08-06 LAB — TESTOSTERONE, FREE, TOTAL, SHBG
Sex Hormone Binding: 104 nmol/L — ABNORMAL HIGH (ref 13–71)
Testosterone, Free: 67.3 pg/mL (ref 47.0–244.0)
Testosterone-% Free: 0.9 % — ABNORMAL LOW (ref 1.6–2.9)
Testosterone: 725.37 ng/dL (ref 250–890)

## 2010-08-06 MED ORDER — METOPROLOL SUCCINATE ER 25 MG PO TB24: 25.0000 mg | ORAL_TABLET | Freq: Every day | ORAL | Status: DC

## 2010-08-06 NOTE — Telephone Encounter (Signed)
Need clarification on meds. Per Dr.Paz- 1 tab daily.

## 2010-08-07 ENCOUNTER — Telehealth: Payer: Self-pay | Admitting: *Deleted

## 2010-08-07 NOTE — Telephone Encounter (Signed)
Message copied by Army Fossa on Wed Aug 07, 2010  9:40 AM ------      Message from: Willow Ora      Created: Tue Aug 06, 2010  5:39 PM       Advise patient:       his testosterone levels are satisfactory, nevertheless I recommend him to stick to our plan.      His LFTs are slt higher than before. Unclear why . Please send the results to his liver doctors at [facility]. Will recheck in 2 months

## 2010-08-07 NOTE — Telephone Encounter (Signed)
Noted, recheck LFTs 2 months

## 2010-08-07 NOTE — Telephone Encounter (Signed)
Message left for patient to return my call.  

## 2010-08-07 NOTE — Telephone Encounter (Signed)
I spoke w/ pt. He states that he has not seen anyone [facility] yet, waiting for an appt.

## 2010-08-08 ENCOUNTER — Other Ambulatory Visit: Payer: Self-pay | Admitting: Internal Medicine

## 2010-08-08 NOTE — Telephone Encounter (Signed)
Pharmacy did not receive

## 2010-08-21 ENCOUNTER — Telehealth: Payer: Self-pay | Admitting: Internal Medicine

## 2010-08-21 NOTE — Telephone Encounter (Signed)
Left msg for pt to return call.

## 2010-08-21 NOTE — Telephone Encounter (Signed)
Patient works outside and when he gets poison ivy on his hands and legs, he puts Chorox on it and that usually takes care of it---this time, it has spread to middle of back and face----1) will Valtrex take care  of it??  2) if not, will Dr Drue Novel call in something to [facility], [address], [place]

## 2010-08-22 ENCOUNTER — Ambulatory Visit (INDEPENDENT_AMBULATORY_CARE_PROVIDER_SITE_OTHER): Payer: BC Managed Care – PPO | Admitting: Family Medicine

## 2010-08-22 ENCOUNTER — Encounter: Payer: Self-pay | Admitting: Family Medicine

## 2010-08-22 VITALS — BP 118/74 | HR 80 | Wt 181.8 lb

## 2010-08-22 DIAGNOSIS — L255 Unspecified contact dermatitis due to plants, except food: Secondary | ICD-10-CM

## 2010-08-22 DIAGNOSIS — L237 Allergic contact dermatitis due to plants, except food: Secondary | ICD-10-CM

## 2010-08-22 MED ORDER — METHYLPREDNISOLONE ACETATE 80 MG/ML IJ SUSP
80.0000 mg | Freq: Once | INTRAMUSCULAR | Status: AC
  Administered 2010-08-22: 80 mg via INTRAMUSCULAR

## 2010-08-22 MED ORDER — PREDNISOLONE SODIUM PHOSPHATE 10 MG PO TBDP: ORAL_TABLET | ORAL | Status: DC

## 2010-08-22 NOTE — Progress Notes (Signed)
  Subjective:     Ricardo Fisher is a 55 y.o. male who presents for evaluation of a rash involving the calf, forearm and head. Rash started 7 days ago. Lesions are clear, and blistering in texture. Rash has changed over time. Rash is pruritic. Associated symptoms: none. Patient denies: abdominal pain, arthralgia, congestion and cough. Patient has not had contacts with similar rash. Patient has had new exposures (soaps, lotions, laundry detergents, foods, medications, plants, insects or animals). 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  The following portions of the patient's history were reviewed and updated as appropriate: allergies, current medications, past family history, past medical history, past social history, past surgical history and problem list.  Review of Systems Pertinent items are noted in HPI.    Objective:    BP 118/74  Pulse 80  Wt 181 lb 12.8 oz (82.464 kg) General:  alert, cooperative and appears stated age  Skin:  vesicles noted on extremities and face     Assessment:    contact dermatitis: plants poison ivy    Plan:    Medications: benadryl and steroids: depo medrol and prednisone taper. Verbal patient instruction given. f/u prn

## 2010-08-22 NOTE — Patient Instructions (Signed)
Poison Ivy (Toxicodendron Dermatitis) Poison ivy is a inflammation of the skin (contact dermatitis) caused by touching the allergens on the leaves of the ivy plant following previous exposure to the plant. The rash usually appears 48 hours after exposure. The rash is usually bumps (papules) or blisters (vesicles) in a linear pattern. Depending on your own sensitivity, the rash may simply cause redness and itching, or it may also progress to blisters which may break open. These must be well cared for to prevent secondary bacterial (germ) infection, followed by scarring. Keep any open areas dry, clean, dressed, and covered with an antibacterial ointment if needed. The eyes may also get puffy. The puffiness is worst in the morning and gets better as the day progresses. This dermatitis usually heals without scarring, within 2 to 3 weeks without treatment. HOME CARE INSTRUCTIONS Thoroughly wash with soap and water as soon as you have been exposed to poison ivy. You have about one half hour to remove the plant resin before it will cause the rash. This washing will destroy the oil or antigen on the skin that is causing, or will cause, the rash. Be sure to wash under your fingernails as any plant resin there will continue to spread the rash. Do not rub skin vigorously when washing affected area. Poison ivy cannot spread if no oil from the plant remains on your body. A rash that has progressed to weeping sores will not spread the rash unless you have not washed thoroughly. It is also important to wash any clothes you have been wearing as these may carry active allergens. The rash will return if you wear the unwashed clothing, even several days later. Avoidance of the plant in the future is the best measure. Poison ivy plant can be recognized by the number of leaves. Generally, poison ivy has three leaves with flowering branches on a single stem. Diphenhydramine may be purchased over the counter and used as needed for  itching. Do not drive with this medication if it makes you drowsy. Ask your caregiver about medication for children. SEEK MEDICAL CARE IF   Open sores develop.   Redness spreads beyond area of rash.   You notice purulent (pus-like) discharge.   You have increased pain.   Other signs of infection develop (such as fever).  Document Released: 04/04/2000 Document Re-Released: 03/26/2009 [employer] Patient Information 2011 [employer]. 

## 2010-08-22 NOTE — Telephone Encounter (Signed)
Pt advised appt needed.

## 2010-09-06 NOTE — Cardiovascular Report (Signed)
NAME:  AVONDRE, RICHENS                        ACCOUNT NO.:  [id number]   MEDICAL RECORD NO.:  [id number]                   PATIENT TYPE:  INP   LOCATION:  6527                                 FACILITY:  [facility]   PHYSICIAN:  Armanda Magic, M.D.                  DATE OF BIRTH:  1955/07/15   DATE OF PROCEDURE:  06/01/2003  DATE OF DISCHARGE:  06/01/2003                              CARDIAC CATHETERIZATION   PROCEDURE:  1. Left heart catheterization.  2. Coronary angiography.  3. Left ventriculography.   OPERATOR:  Armanda Magic, M.D.   COMPLICATIONS:  None.   IV ACCESS:  Via right femoral artery with 6 French sheath.   INDICATIONS:  Chest pain, abnormal Cardiolite.   This is a very pleasant 55 year old white male with no previous cardiac  history who presented to Dr. Jacqualine Code office for some abdominal cramping and  all of a sudden had a sudden onset of substernal chest pain relieved with  sublingual nitroglycerin.  Of note, he has recently been diagnosed with  borderline hypertension and now presents for cardiac catheterization due to  abnormal stress Cardiolite study.   The patient is brought to the cardiac catheterization laboratory in a  fasting nonsedated state.  Informed consent was obtained.  The patient was  connected to continuous heart rate and pulse oximetry monitoring,  intermittent blood pressure monitoring.  The right groin was prepped and  draped in a sterile fashion.  1% Xylocaine was used for local anesthesia.  Using modified Seldinger technique, a 6 French sheath was placed in the  right femoral artery.  Under fluoroscopic guidance, a 6 [place] JL-4 catheter  was placed in the left coronary artery.  Multiple cine films were taken at  30-degree RAO, 40-degree LAO views.  This catheter was then  exchanged out  over guide wire for 6 [place] JR-4 catheter which was placed under  fluoroscopic guidance in the right coronary artery.  Multiple cine films  were taken at  30-degree RAO, 40-degree LAO views.  The catheter was then  exchanged over guide wire for 6 French angled pigtail catheter which was  placed under fluoroscopic guidance in the left ventricular cavity.  Left  ventriculography was performed in 30-degree RAO view using total 30 ml  contrast at 15 ml per second.  The catheter was then pulled back across the  aortic valve with no significant pressure gradient noted.  At the end of the  procedure, all catheters and sheaths were removed.  Manual compression was  performed until adequate hemostasis was obtained.  The patient was  transferred back to the room in stable condition.   RESULTS:  Left main coronary artery is widely patent and bifurcates into the  left anterior descending artery and left circumflex artery.  The left  anterior descending is widely patent throughout the course of the apex.  It  gives rise to one large diagonal branch which  is widely patent in the  proximal and mid portion and then bifurcates distally into two daughter  branches.  The superior branch has a 40% narrowing proximally.  The inferior  branch is patent.  The left circumflex is patent throughout its course and  gives rise to a first obtuse marginal branch which is small, but patent.  It  then terminates in a second obtuse marginal branch which is widely patent.  Right coronary artery is widely patent throughout its course and bifurcates  distally in a posterior descending artery and posterior lateral artery; both  of which are widely patent.   Left ventriculography shows low normal left ventricular systolic function.  EF 50-55%.  LV pressure 171/15 mmHg.  LVEDP 19 mmHg.  Aortic pressure 171/92  mmHg.   ASSESSMENT:  1. Noncardiac chest pain.  2. Nonobstructive coronary disease of a branch diagonal.  3. Low normal left ventricular function.  4. Hypertension with elevated blood pressure.  5. Elevated LVEDP probably secondary to diastolic dysfunction from      hypertension.   PLAN:  Discharge to home after bed rest and IV fluids.  Aspirin 81 mg daily.  Aggressive blood pressure control.  Continue Toprol XL 25 mg daily.  Add  HCTZ 25 mg daily. Blood pressure check and BMET on Monday in my office.  If  blood pressure not improved, will need to either have to add an ACE  inhibitor or Norvasc.  Follow up with me in two weeks.  Zocor for elevated  lipids.  He will get a statin panel in six weeks.                                               Armanda Magic, M.D.    TT/MEDQ  D:  06/01/2003  T:  06/02/2003  Job:  [id number]

## 2010-09-06 NOTE — H&P (Signed)
NAME:  Ricardo Fisher, COGGESHALL NO.:  [id number]   MEDICAL RECORD NO.:  [id number]                   PATIENT TYPE:  EMS   LOCATION:  ED                                   FACILITY:  [facility]   PHYSICIAN:  Abigail Miyamoto, M.D.              DATE OF BIRTH:  1955/05/12   DATE OF ADMISSION:  09/25/2002  DATE OF DISCHARGE:                                HISTORY & PHYSICAL   CHIEF COMPLAINT:  Abdominal pain.   HISTORY OF PRESENT ILLNESS:  [name] is a pleasant 55 year old  gentleman who started having periumbilical abdominal pain on Saturday,  progressed through the day and night, and today presented with more severe  pain in the right lower quadrant.  He attributed this to gas pain, but as  the pain got more severe and he had loss of appetite, he presented to the  emergency room for evaluation.  He denies any nausea or vomiting.  He  reports normal bowel movements.  He denies any dyspnea.  He reports he was  generally fatigued all week last week.  He has had no chest pain or  shortness of breath.  He does report a low-grade fever.  Review of systems  is otherwise unremarkable.   PAST MEDICAL HISTORY:  Negative.   PAST SURGICAL HISTORY:  Orthopedic repair of right femur fracture.   MEDICATIONS:  None.   ALLERGIES:  No known drug allergies.   SOCIAL HISTORY:  He works as a Administrator.  He does smoke a pack of  cigarettes a day and drinks alcohol on occasion.   REVIEW OF SYSTEMS:  Negative from a cardiopulmonary standpoint.  Otherwise  as above.   PHYSICAL EXAMINATION:  GENERAL:  A mildly obese gentleman in moderate  discomfort.  Again, he is moderately ill in appearance.  VITAL SIGNS:  Temperature is 97.6; pulse is 100; blood pressure is 141/83;  respiratory rate is 22.  EYES:  There is no jaundice.  Pupils are reactive.  EAR, NOSE, MOUTH & THROAT:  His oropharynx is clear.  External ears and nose  are normal in appearance.  Hearing is normal.  NECK:  Supple.  There is no cervical adenopathy.  There is no thyromegaly.  LUNGS:  Clear to auscultation bilaterally.  Effort is normal.  CARDIOVASCULAR:  Mildly tachycardic but regular rhythm and no murmurs.  There is no peripheral edema.  ABDOMEN:  Soft.  There is no organomegaly.  There is significant tenderness  with guarding in the right lower quadrant.  There are no masses.  There is  positive rebound.  There are no hernias.  EXTREMITIES:  The extremities are warm and well perfused.  Gait is normal.   LABORATORY DATA:  Patient has an elevated white blood count of 22.5,  hemoglobin 16.3, hematocrit of 46.7 and platelets of 203.  Electrolytes are  normal:  Potassium is 4.6.  BUN and creatinine are 10 and  0.9.  Liver  function tests are normal, and urinalysis is normal.  CAT scan of the  abdomen and pelvis shows dilated appendix with a large amount of  periappendiceal inflammation consistent with acute appendicitis.   ASSESSMENT/PLAN:  This is a 55 year old gentleman with probable acute  appendicitis based on his right lower quadrant tenderness, elevated white  blood count and CAT scan findings.  At this point, appendectomy is  recommended.  I have discussed the procedure with the patient in detail.  We  discussed the risks of surgery, including bleeding, infection, finding a  normal appendix, etc., and, at this point, he wishes to proceed.  Surgery  will thus be scheduled for as soon as possible.                                               Abigail Miyamoto, M.D.    DB/MEDQ  D:  09/25/2002  T:  09/26/2002  Job:  [id number]
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
## 2010-09-06 NOTE — Op Note (Signed)
NAME:  [name], [name] NO.:  [id number]   MEDICAL RECORD NO.:  [id number]                   PATIENT TYPE:  INP   LOCATION:  0101                                 FACILITY:  [facility]   PHYSICIAN:  Abigail Miyamoto, M.D.              DATE OF BIRTH:  1955/05/12   DATE OF PROCEDURE:  09/26/2002  DATE OF DISCHARGE:                                 OPERATIVE REPORT   PREOPERATIVE DIAGNOSIS:  Acute appendicitis.   POSTOPERATIVE DIAGNOSIS:  Acute appendicitis.   PROCEDURE:  Open appendectomy.   SURGEON:  Abigail Miyamoto, M.D.   ANESTHESIA:  General endotracheal anesthesia.   ESTIMATED BLOOD LOSS:  Minimal.   INDICATIONS FOR PROCEDURE:  Ricardo Fisher is a 55 year old gentleman who  presented with abdominal pain, elevated white blood count and a CAT scan  finding consistent with acute appendicitis. Therefore a decision was made to  proceed to the operating room for appendectomy.   FINDINGS:  The patient was found to have acute appendicitis without evidence  of perforation.   DESCRIPTION OF PROCEDURE:  The patient was brought to the operating room,  identified as Ricardo Fisher. He was placed supine on the operating room  table and general anesthesia was induced. His abdomen was then prepped and  draped in the usual sterile fashion. Using a #10 blade, a small transverse  incision was made in the patient's right lower quadrant. The incision was  carried down to the fascia with the electrocautery. The fascia was then  opened with the cautery. The underlying muscles were then bluntly dissected  and separated with retractors. The underlying peritoneum was then opened  with a scalpel and then further bluntly. Upon entering the abdomen, a  minimal amount of free fluid was identified. The appendix was easily  identified and elevated out into the wound with a Babcock. The mesoappendix  was then taken down with clamps and 2-0 Vicryl ties. The base of the  appendix was then identified and clamped with a hemostat and then transected  with the scalpel. The base was then tied off with two separate 2-0 Vicryl  suture ligatures. The mucosa at the stump was cauterized. The appendix was  sent to pathology for identification. The abdomen was then irrigated with  normal saline. Hemostasis appeared to be achieved. The peritoneum was then  closed with running 2-0 Vicryl suture. The fascia was then closed with a  running #1 Prolene suture. The skin was then irrigated thoroughly and closed  with skin staples. The patient tolerated the procedure well. All sponge,  needle and instrument counts were correct at the end of the procedure. The  patient was then extubated in the operating room and taken in stable  condition to the recovery room.  Abigail Miyamoto, M.D.    DB/MEDQ  D:  09/26/2002  T:  09/26/2002  Job:  [id number]
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
## 2010-09-18 ENCOUNTER — Other Ambulatory Visit: Payer: Self-pay | Admitting: Internal Medicine

## 2010-09-19 ENCOUNTER — Other Ambulatory Visit: Payer: Self-pay | Admitting: Internal Medicine

## 2010-09-19 MED ORDER — TESTOSTERONE 12.5 MG/ACT (1%) TD GEL: 3.0000 "application " | Freq: Every day | TRANSDERMAL | Status: DC

## 2010-09-19 NOTE — Telephone Encounter (Signed)
Pharm did not receive refills.

## 2010-10-10 ENCOUNTER — Telehealth: Payer: Self-pay | Admitting: Internal Medicine

## 2010-10-10 NOTE — Telephone Encounter (Signed)
Advise pt , due for recheck LFTs---dx hep. C (recent worsening of labs may be due to testosterone)

## 2010-10-11 NOTE — Telephone Encounter (Signed)
Message left for patient to return my call.  

## 2010-10-14 NOTE — Telephone Encounter (Signed)
Message left for patient to return my call.  

## 2010-10-15 NOTE — Telephone Encounter (Signed)
Appt scheduled

## 2010-11-07 ENCOUNTER — Other Ambulatory Visit: Payer: Self-pay | Admitting: Internal Medicine

## 2010-11-07 DIAGNOSIS — E875 Hyperkalemia: Secondary | ICD-10-CM

## 2010-11-08 ENCOUNTER — Other Ambulatory Visit (INDEPENDENT_AMBULATORY_CARE_PROVIDER_SITE_OTHER): Payer: BC Managed Care – PPO

## 2010-11-08 DIAGNOSIS — E875 Hyperkalemia: Secondary | ICD-10-CM

## 2010-11-08 LAB — HEPATIC FUNCTION PANEL
ALT: 125 U/L — ABNORMAL HIGH (ref 0–53)
Alkaline Phosphatase: 79 U/L (ref 39–117)
Bilirubin, Direct: 0.1 mg/dL (ref 0.0–0.3)
Total Bilirubin: 0.7 mg/dL (ref 0.3–1.2)
Total Protein: 7.8 g/dL (ref 6.0–8.3)

## 2010-11-11 NOTE — Progress Notes (Signed)
Labs only

## 2010-11-19 ENCOUNTER — Encounter: Payer: Self-pay | Admitting: Internal Medicine

## 2010-11-19 ENCOUNTER — Ambulatory Visit (INDEPENDENT_AMBULATORY_CARE_PROVIDER_SITE_OTHER): Payer: BC Managed Care – PPO | Admitting: Internal Medicine

## 2010-11-19 VITALS — BP 108/70 | HR 72 | Temp 98.5°F | Wt 183.2 lb

## 2010-11-19 DIAGNOSIS — J209 Acute bronchitis, unspecified: Secondary | ICD-10-CM

## 2010-11-19 DIAGNOSIS — L719 Rosacea, unspecified: Secondary | ICD-10-CM

## 2010-11-19 MED ORDER — METRONIDAZOLE 1 % EX GEL: Freq: Every day | CUTANEOUS | Status: AC

## 2010-11-19 MED ORDER — AZITHROMYCIN 250 MG PO TABS: ORAL_TABLET | ORAL | Status: AC

## 2010-11-19 NOTE — Progress Notes (Signed)
Addended byMarga Melnick F on: 11/19/2010 02:05 PM   Modules accepted: Orders

## 2010-11-19 NOTE — Progress Notes (Signed)
  Subjective:    Patient ID: Ricardo Fisher, male    DOB: Aug 24, 1955, 55 y.o.   MRN: [id number]  HPI    Review of Systems     Objective:   Physical Exam facial Rosaceasuggested        Assessment & Plan:   No problem-specific assessment & plan notes found for this encounter.

## 2010-11-19 NOTE — Patient Instructions (Signed)
Zicam Melts or Zinc lozenges ; vitamin C 2000 mg daily; & Echinacea for 4-7 days. Report fever, exudate("pus") or progressive pain. Plain Mucinex for thick secretions ;force NON dairy fluids for next 48 hrs. Use a Neti pot daily as needed for sinus congestion  

## 2010-11-19 NOTE — Progress Notes (Signed)
  Subjective:    Patient ID: Ricardo Fisher, male    DOB: July 10, 1955, 55 y.o.   MRN: [id number]  HPIRespiratory tract infection Onset/symptoms:7/29 as ST  Exposures (illness/environmental/extrinsic):co-worker was ill; dust exposure on job Progression of symptoms:to productive cough Treatments/response:ASA Present symptoms:yellow sputum Fever/chills/sweats:last 7/30 Frontal headache:no Facial pain:no Nasal purulence:no Sore throat:not now Dental pain:no Lymphadenopathy:no Wheezing/shortness of breath:no Pleuritic pain:no Associated extrinsic/allergic symptoms:itchy eyes/ sneezing:no Smoking history:1/2 ppd           Review of Systems     Objective:   Physical Exam General appearance is of good health and nourishment; no acute distress or increased work of breathing is present.  No  lymphadenopathy about the head, neck, or axilla noted.   Eyes: No conjunctival inflammation or lid edema is present.  Post traumatic color change OD Ears:  External ear exam shows no significant lesions or deformities.  Otoscopic examination reveals clear canals, tympanic membranes are intact bilaterally without bulging, retraction, inflammation or discharge.  Nose:  External nasal examination shows no deformity or inflammation. Nasal mucosa are pink and moist without lesions or exudates. No septal dislocation or dislocation.No obstruction to airflow.  Scar tissue R nare  Oral exam: Dental hygiene is good; lips and gums are healthy appearing.There is  oropharyngeal erythema w/o  exudate noted.    Heart:  Normal rate and regular rhythm. S1 and S2 normal without gallop, murmur, click, rub or other extra sounds.   Lungs:Chest clear to auscultation; no wheezes, rhonchi,rales ,or rubs present.No increased work of breathing.    Extremities:  No cyanosis, edema, or clubbing  noted    Skin: Warm & dry        Assessment & Plan:   #1 bronchitis, acute with some purulent secretions. No  definite associated rhinosinusitis.  Plan: See orders and recommendations.

## 2010-12-06 ENCOUNTER — Telehealth: Payer: Self-pay | Admitting: Internal Medicine

## 2010-12-06 NOTE — Telephone Encounter (Signed)
patient  Returning Dr Leta Jungling call to inform him that he is in a study now and will begin next Friday.  patient  Is very excited and would like to thank Dr Drue Novel with following up with his care.  He is very Adult nurse.

## 2010-12-06 NOTE — Telephone Encounter (Signed)
Patient was seen 12-04-10 at [facility] hepatitis clinic. No perfused. LMOM: Checking on patient, asked her to call me back

## 2010-12-09 NOTE — Telephone Encounter (Signed)
LMOM: rec to discuss w/ Hep C clinic the fact that LFTs are slt higher since he started HRT. Stop androgel?

## 2010-12-10 ENCOUNTER — Telehealth: Payer: Self-pay | Admitting: *Deleted

## 2010-12-10 NOTE — Telephone Encounter (Signed)
Patient requesting clarification as to how he should stop his AndroGel [due to elevated LFTs] Would like to know if he is to stop all at once or step down on daily usage to stop.? Pt also states that after getting his schedule together at the end of the week that he will call to make ROV.

## 2010-12-10 NOTE — Telephone Encounter (Signed)
He can just stop. I anticipate possibly hot flashes and decreased energy for a few weeks

## 2010-12-10 NOTE — Telephone Encounter (Signed)
LMOM to inform patient. 

## 2011-01-19 IMAGING — CR DG CHEST 2V
2 series · 2 of 2 positions shown · non-contrast
Comparison: 10/12/2007

CLINICAL DATA: Weight loss, ex-smoker.

CHEST - 2 VIEW

[view not recorded (1 of 2)]
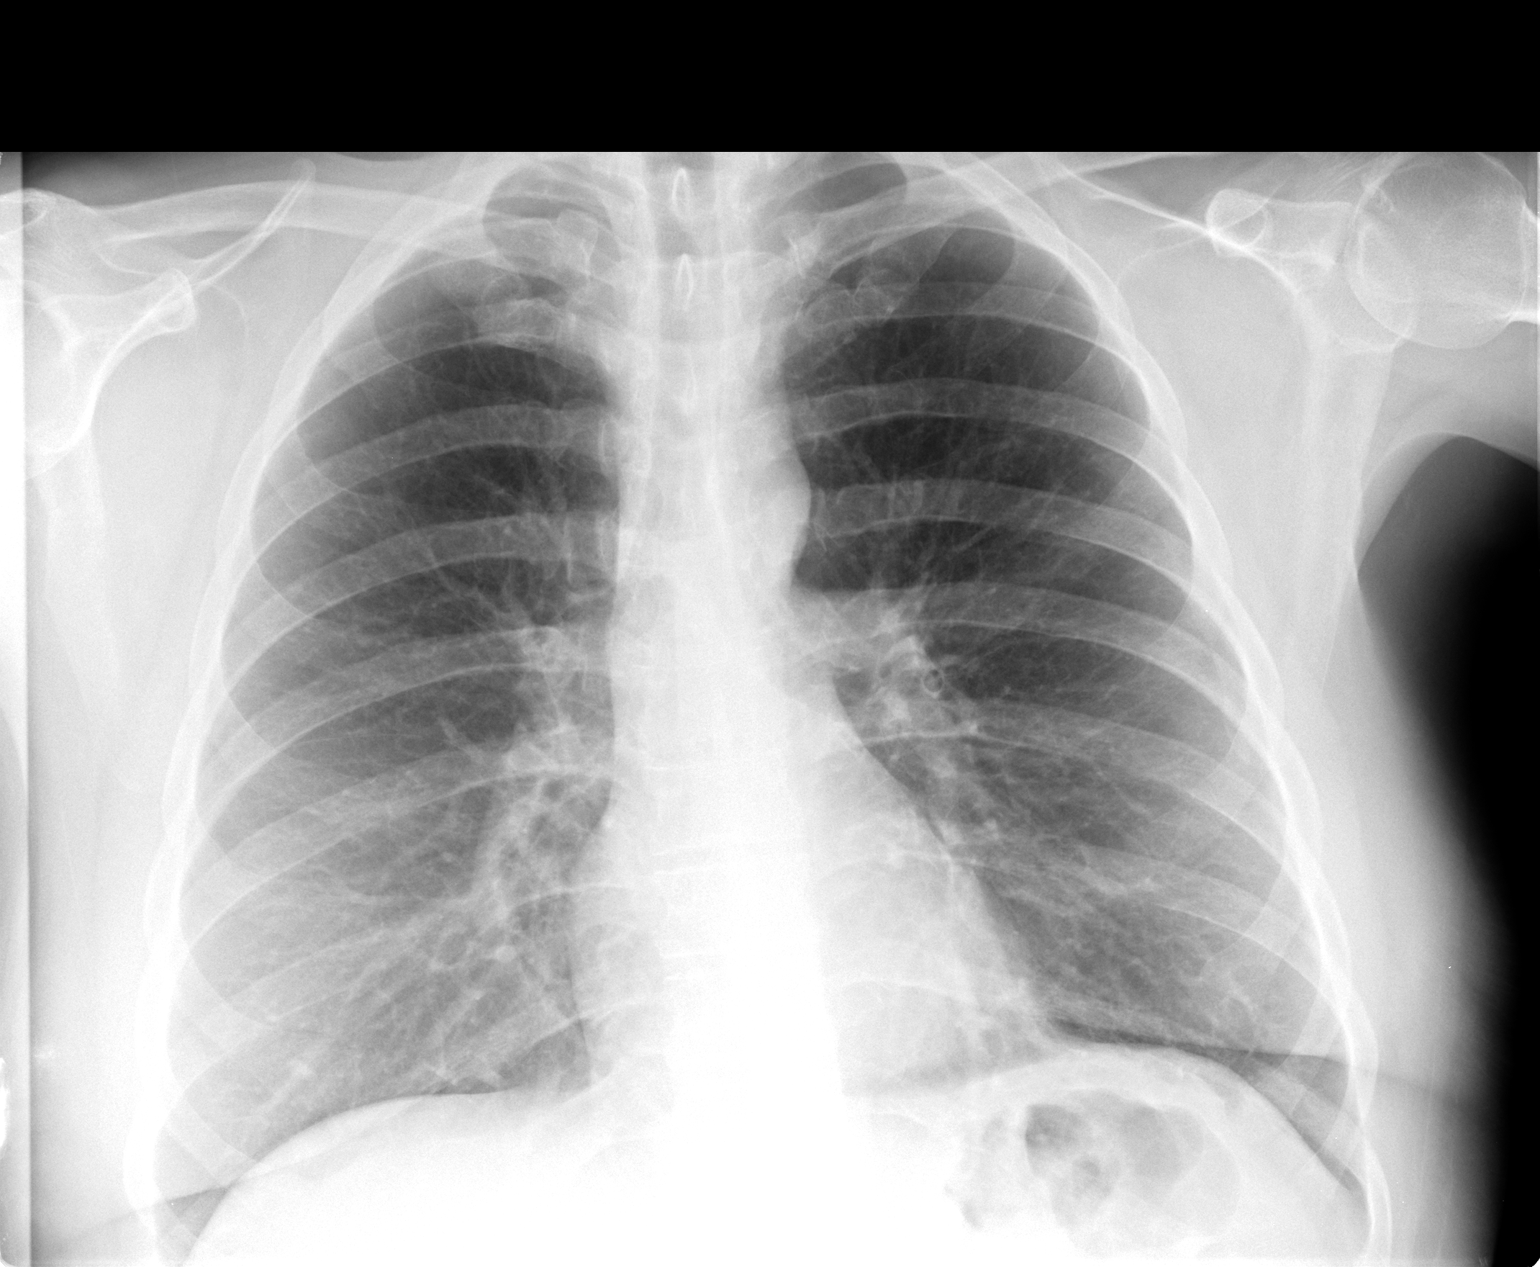

[view not recorded (2 of 2)]
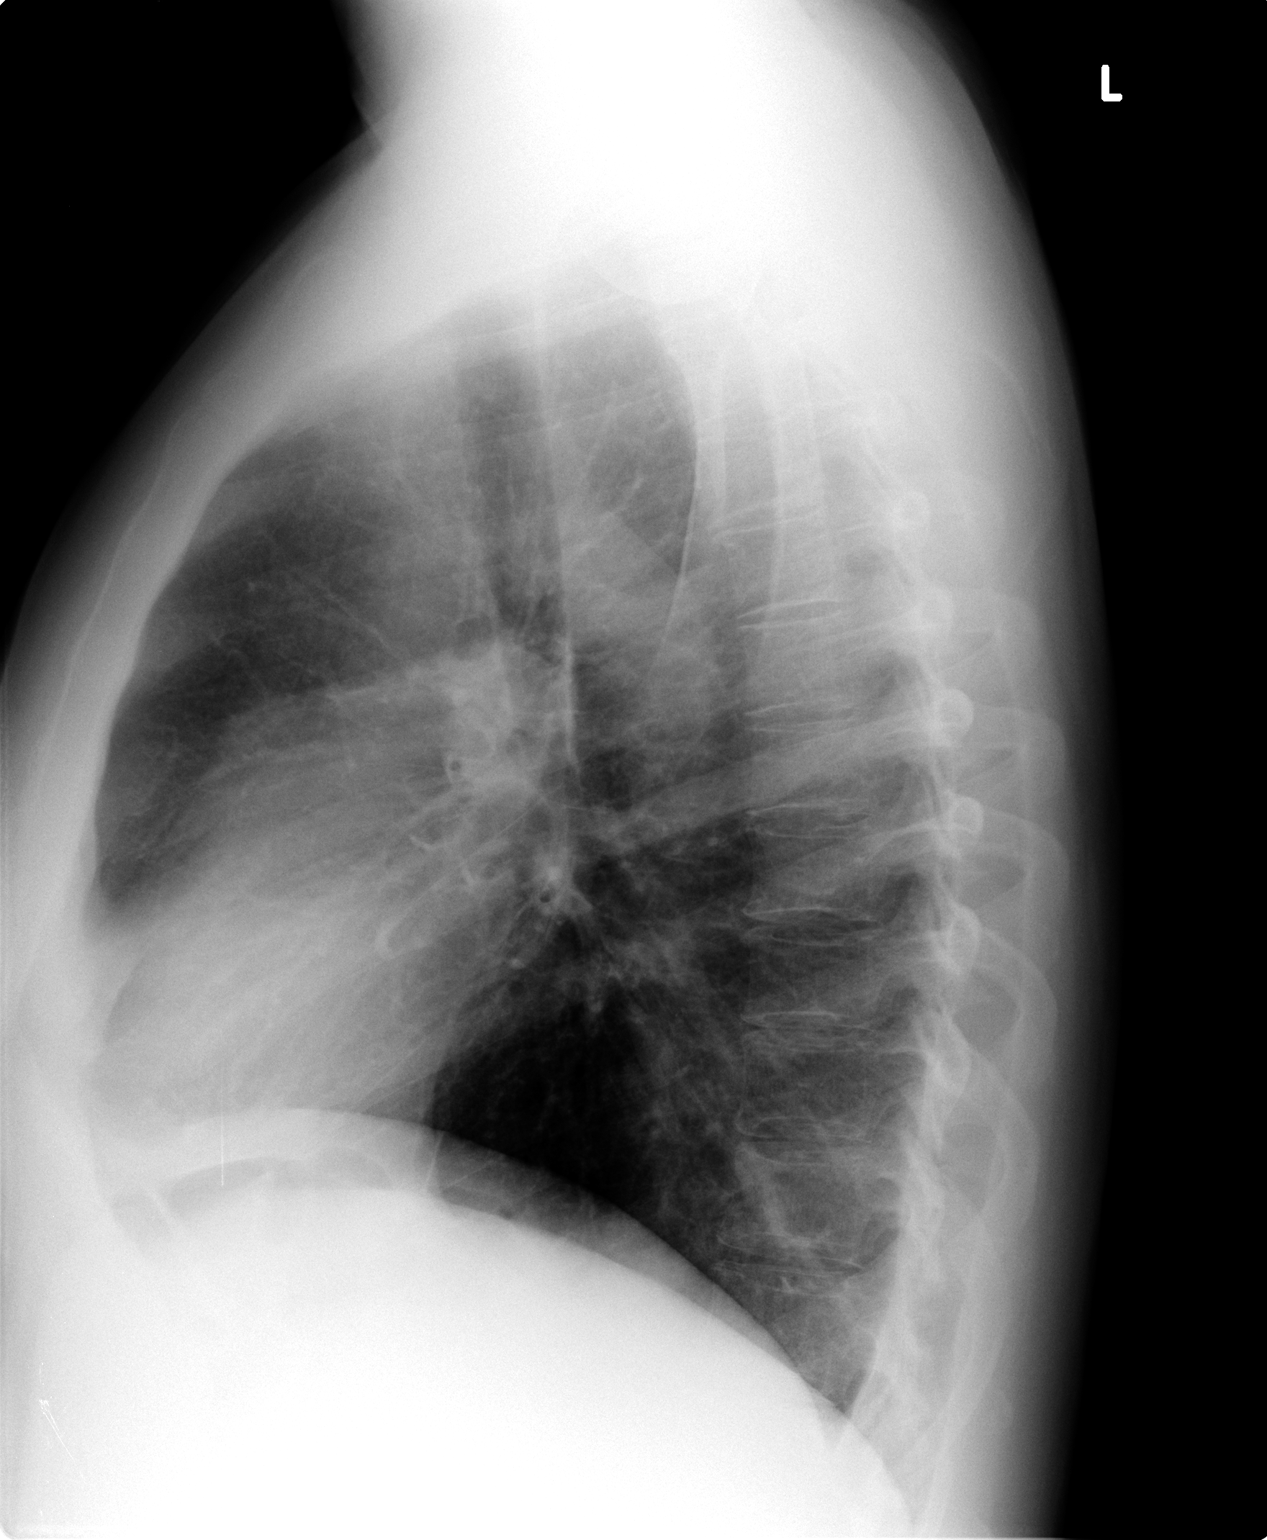

[2 of 2 positions shown; findings below may reference images not displayed]

FINDINGS: Trachea is midline.  Heart size normal.  Lungs are clear.
No pleural fluid.
IMPRESSION: No acute findings.

## 2011-06-16 ENCOUNTER — Other Ambulatory Visit: Payer: Self-pay | Admitting: Internal Medicine

## 2011-06-16 NOTE — Telephone Encounter (Signed)
Refill done. LMOVM for pt to call & set up an OV.

## 2011-06-16 NOTE — Telephone Encounter (Signed)
Refill request Androgel pump 20.25mg /ACT. OK to refill?

## 2011-06-16 NOTE — Telephone Encounter (Signed)
Has not been seen in several months, okay one month supply, no refills. No further refills without office visit, let the  patient know

## 2011-11-14 ENCOUNTER — Other Ambulatory Visit: Payer: Self-pay | Admitting: Internal Medicine

## 2011-11-14 NOTE — Telephone Encounter (Signed)
Pt has not been seen by you within a year. OK to refill?

## 2011-11-14 NOTE — Telephone Encounter (Signed)
Refill done.  

## 2011-12-15 ENCOUNTER — Encounter: Payer: Self-pay | Admitting: Internal Medicine

## 2011-12-15 ENCOUNTER — Ambulatory Visit (INDEPENDENT_AMBULATORY_CARE_PROVIDER_SITE_OTHER): Payer: BC Managed Care – PPO | Admitting: Internal Medicine

## 2011-12-15 VITALS — BP 122/70 | HR 72 | Temp 98.1°F | Wt 168.0 lb

## 2011-12-15 DIAGNOSIS — E291 Testicular hypofunction: Secondary | ICD-10-CM

## 2011-12-15 DIAGNOSIS — B182 Chronic viral hepatitis C: Secondary | ICD-10-CM

## 2011-12-15 DIAGNOSIS — I10 Essential (primary) hypertension: Secondary | ICD-10-CM

## 2011-12-15 MED ORDER — TESTOSTERONE 20.25 MG/ACT (1.62%) TD GEL: 1.0000 "application " | Freq: Every day | TRANSDERMAL | Status: DC

## 2011-12-15 MED ORDER — METOPROLOL SUCCINATE ER 25 MG PO TB24: 12.5000 mg | ORAL_TABLET | Freq: Every day | ORAL | Status: DC

## 2011-12-15 NOTE — Assessment & Plan Note (Addendum)
Just completed extensive treatment at [facility], notes  reviewed

## 2011-12-15 NOTE — Assessment & Plan Note (Signed)
Well-controlled on half tablet of metoprolol, refill meds

## 2011-12-15 NOTE — Assessment & Plan Note (Addendum)
Here after more than a year, during this time he has not been taking testosterone as prescribed, in fact reports that two tubes last about 6 months. We talk about possibly discontinuing HRT but the patient felt that it helps so much with his energy level that he does not desires to do that. At some point I suspect it LFTs were elevated due to HRT, see previous phone notes. Currently he is taking a very low dose of testosterone and feeling well consequently will continue with a low dose. Plan: Check LFTs Check testosterone Refill HRT ----> 1 pump daily
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
## 2011-12-16 LAB — TESTOSTERONE, FREE, TOTAL, SHBG: Sex Hormone Binding: 83 nmol/L — ABNORMAL HIGH (ref 13–71)

## 2011-12-16 LAB — HEPATIC FUNCTION PANEL
ALT: 13 U/L (ref 0–53)
Alkaline Phosphatase: 51 U/L (ref 39–117)
Bilirubin, Direct: 0.1 mg/dL (ref 0.0–0.3)
Total Bilirubin: 0.3 mg/dL (ref 0.3–1.2)

## 2011-12-17 ENCOUNTER — Encounter: Payer: Self-pay | Admitting: *Deleted

## 2012-02-02 ENCOUNTER — Other Ambulatory Visit: Payer: Self-pay | Admitting: Internal Medicine

## 2012-02-02 NOTE — Telephone Encounter (Signed)
Refill done.  

## 2012-03-31 ENCOUNTER — Telehealth: Payer: Self-pay | Admitting: Internal Medicine

## 2012-03-31 NOTE — Telephone Encounter (Signed)
Patient states he will need a refill on Androgel around Christmas time. He states Dr. Drue Novel wanted him to make his 12/15/11 rx last 5 months. Patient states he is trying his best, but he just cannot make it last 5 months. Patient will call pharmacy when he needs a refill. This is just an [place].

## 2012-04-06 ENCOUNTER — Other Ambulatory Visit: Payer: Self-pay | Admitting: Internal Medicine

## 2012-04-06 NOTE — Telephone Encounter (Signed)
Ok to refill 

## 2012-04-06 NOTE — Telephone Encounter (Signed)
Okay one-month supply and one refill. Please remind pt he is due for a  Visit this months

## 2012-04-06 NOTE — Telephone Encounter (Signed)
Left detailed msg on pt's vmail advising him to call the office to schedule an OV.  Refill done.

## 2012-11-15 ENCOUNTER — Encounter: Payer: Self-pay | Admitting: Internal Medicine

## 2012-11-15 ENCOUNTER — Ambulatory Visit (INDEPENDENT_AMBULATORY_CARE_PROVIDER_SITE_OTHER): Payer: BC Managed Care – PPO | Admitting: Internal Medicine

## 2012-11-15 VITALS — BP 120/80 | HR 62 | Temp 97.9°F | Wt 175.6 lb

## 2012-11-15 DIAGNOSIS — J329 Chronic sinusitis, unspecified: Secondary | ICD-10-CM

## 2012-11-15 DIAGNOSIS — E291 Testicular hypofunction: Secondary | ICD-10-CM

## 2012-11-15 MED ORDER — AMOXICILLIN 500 MG PO CAPS: 1000.0000 mg | ORAL_CAPSULE | Freq: Two times a day (BID) | ORAL | Status: AC

## 2012-11-15 MED ORDER — FLUTICASONE PROPIONATE 50 MCG/ACT NA SUSP: 2.0000 | Freq: Every day | NASAL | Status: DC

## 2012-11-15 NOTE — Assessment & Plan Note (Signed)
Taking testosterone 1 pump every other day. "Trying to get off it"; he feels well whenever he takes a low dose of testosterone. Plan: Due for a checkup, will discuss the issue further when he tends back for his physical

## 2012-11-15 NOTE — Patient Instructions (Addendum)
Rest, fluids ,   Mucinex   twice a day as needed for congestion flonase 2 sprays on each side of the nose daily until you feel better Take the antibiotic as prescribed  (Amoxicillin) Call if no better in few days Call anytime if the symptoms are severe ---- Please schedule a physical at your convenience
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
## 2012-12-15 ENCOUNTER — Other Ambulatory Visit: Payer: Self-pay | Admitting: Internal Medicine

## 2012-12-15 ENCOUNTER — Other Ambulatory Visit: Payer: Self-pay | Admitting: *Deleted

## 2012-12-15 NOTE — Telephone Encounter (Signed)
Rx refilled for Androgel for 1 month patient has an appointment scheduled in September 2014.  Ag cma

## 2013-01-13 ENCOUNTER — Telehealth: Payer: Self-pay

## 2013-01-13 NOTE — Telephone Encounter (Signed)
LM for CB  HM UTD WE: due for flu vaccine Last PSA 04/25/2010 (abnormal)

## 2013-01-14 ENCOUNTER — Encounter: Payer: Self-pay | Admitting: Internal Medicine

## 2013-01-14 ENCOUNTER — Ambulatory Visit (INDEPENDENT_AMBULATORY_CARE_PROVIDER_SITE_OTHER): Payer: BC Managed Care – PPO | Admitting: Internal Medicine

## 2013-01-14 VITALS — BP 135/82 | HR 59 | Temp 97.8°F | Ht 69.5 in | Wt 180.4 lb

## 2013-01-14 DIAGNOSIS — Z0000 Encounter for general adult medical examination without abnormal findings: Secondary | ICD-10-CM

## 2013-01-14 DIAGNOSIS — Z23 Encounter for immunization: Secondary | ICD-10-CM

## 2013-01-14 DIAGNOSIS — I10 Essential (primary) hypertension: Secondary | ICD-10-CM

## 2013-01-14 DIAGNOSIS — B182 Chronic viral hepatitis C: Secondary | ICD-10-CM

## 2013-01-14 DIAGNOSIS — F411 Generalized anxiety disorder: Secondary | ICD-10-CM

## 2013-01-14 DIAGNOSIS — E291 Testicular hypofunction: Secondary | ICD-10-CM

## 2013-01-14 DIAGNOSIS — J309 Allergic rhinitis, unspecified: Secondary | ICD-10-CM | POA: Insufficient documentation

## 2013-01-14 HISTORY — DX: Allergic rhinitis, unspecified: J30.9

## 2013-01-14 NOTE — Assessment & Plan Note (Signed)
We discussed his HRT today, his taking approximately one pump daily and feeling well with that. I offered him a referral to see endocrinology but he declined, for now will continue with one pump daily, will monitor his testosterone levels mostly to be sure is not too high. Historically , he has a tremendous amount of muscle waste without HRT.

## 2013-01-14 NOTE — Assessment & Plan Note (Signed)
Takes xanax very seldom

## 2013-01-14 NOTE — Assessment & Plan Note (Addendum)
F/u elsewhere States got hep A B shots elsewhere

## 2013-01-14 NOTE — Assessment & Plan Note (Signed)
Encouraged use of flonase
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
## 2013-01-14 NOTE — Assessment & Plan Note (Addendum)
last TD 2010 zostavax-- Recommend patient to discuss with his hep C doctors before they I prescribe it Flu shot today  2006  Cscope Dr Luther Parody Normal ,no FH , hemocult (-) today d/w patient diet-exercise

## 2013-01-14 NOTE — Patient Instructions (Signed)
Is recommended that you have a shot to prevent shingles (Zostavax), please discussed the issue with your doctors at the hepatitis clinic  to be sure that is safe for you.  Come back fasting for labs: FLP, CMP, CBC, TSH, PSA, free-totale testosterone---- dx V70  Next visit in 6 months

## 2013-01-14 NOTE — Assessment & Plan Note (Addendum)
Normal amb BPs on low dose BB

## 2013-01-14 NOTE — Telephone Encounter (Signed)
Unable to reach pre visit.  

## 2013-01-15 ENCOUNTER — Encounter: Payer: Self-pay | Admitting: Internal Medicine

## 2013-01-18 ENCOUNTER — Other Ambulatory Visit (INDEPENDENT_AMBULATORY_CARE_PROVIDER_SITE_OTHER): Payer: BC Managed Care – PPO

## 2013-01-18 DIAGNOSIS — D72829 Elevated white blood cell count, unspecified: Secondary | ICD-10-CM

## 2013-01-18 DIAGNOSIS — Z0000 Encounter for general adult medical examination without abnormal findings: Secondary | ICD-10-CM

## 2013-01-18 LAB — CBC WITH DIFFERENTIAL/PLATELET
Basophils Relative: 1.2 % (ref 0.0–3.0)
Eosinophils Absolute: 0.4 10*3/uL (ref 0.0–0.7)
Eosinophils Relative: 2.9 % (ref 0.0–5.0)
HCT: 45.3 % (ref 39.0–52.0)
Hemoglobin: 15.3 g/dL (ref 13.0–17.0)
Lymphs Abs: 2.9 10*3/uL (ref 0.7–4.0)
MCHC: 33.8 g/dL (ref 30.0–36.0)
Monocytes Relative: 7.3 % (ref 3.0–12.0)
Neutro Abs: 8.2 10*3/uL — ABNORMAL HIGH (ref 1.4–7.7)
Neutrophils Relative %: 65.4 % (ref 43.0–77.0)
Platelets: 164 10*3/uL (ref 150.0–400.0)
RBC: 4.8 Mil/uL (ref 4.22–5.81)
WBC: 12.6 10*3/uL — ABNORMAL HIGH (ref 4.5–10.5)

## 2013-01-18 LAB — COMPREHENSIVE METABOLIC PANEL
AST: 31 U/L (ref 0–37)
Albumin: 4 g/dL (ref 3.5–5.2)
Alkaline Phosphatase: 53 U/L (ref 39–117)
BUN: 18 mg/dL (ref 6–23)
CO2: 29 mEq/L (ref 19–32)
Creatinine, Ser: 1.1 mg/dL (ref 0.4–1.5)
GFR: 73.26 mL/min (ref >60.00)
Glucose, Bld: 92 mg/dL (ref 70–99)
Total Bilirubin: 0.3 mg/dL (ref 0.3–1.2)

## 2013-01-18 LAB — LIPID PANEL
HDL: 37.4 mg/dL — ABNORMAL LOW (ref >39.00)
Total CHOL/HDL Ratio: 5
Triglycerides: 177 mg/dL — ABNORMAL HIGH (ref 0.0–149.0)
VLDL: 35.4 mg/dL (ref 0.0–40.0)

## 2013-01-18 LAB — TSH: TSH: 2.4 u[IU]/mL (ref 0.35–5.50)

## 2013-01-20 LAB — TESTOSTERONE, FREE, TOTAL, SHBG
Sex Hormone Binding: 66 nmol/L (ref 13–71)
Testosterone, Free: 67.6 pg/mL (ref 47.0–244.0)
Testosterone-% Free: 1.3 % — ABNORMAL LOW (ref 1.6–2.9)
Testosterone: 520 ng/dL (ref 300–890)

## 2013-01-27 ENCOUNTER — Encounter: Payer: Self-pay | Admitting: *Deleted

## 2013-03-05 ENCOUNTER — Other Ambulatory Visit: Payer: Self-pay | Admitting: Internal Medicine

## 2013-03-07 NOTE — Telephone Encounter (Signed)
Androgel pump refilled per protocol

## 2013-03-09 ENCOUNTER — Telehealth: Payer: Self-pay | Admitting: Internal Medicine

## 2013-03-09 NOTE — Telephone Encounter (Signed)
ANDROGEL PUMP 20.25 MG/ACT (1.62%) G[facility] Med refill for

## 2013-03-09 NOTE — Telephone Encounter (Signed)
Patient called stating that his Androgel Rx has not been received by pharmacy. Called pharmacy and gave a verbal ok. Advised patient.

## 2013-03-26 ENCOUNTER — Telehealth: Payer: Self-pay | Admitting: Internal Medicine

## 2013-03-26 NOTE — Telephone Encounter (Signed)
Advise pt: Needs a CBC--dx leukocytosis Please arrange

## 2013-04-01 ENCOUNTER — Other Ambulatory Visit: Payer: Self-pay | Admitting: Internal Medicine

## 2013-04-01 NOTE — Telephone Encounter (Signed)
Lmovm. DJR  

## 2013-04-01 NOTE — Telephone Encounter (Signed)
rx refilled per prrtocol. DJR

## 2013-10-10 ENCOUNTER — Other Ambulatory Visit: Payer: Self-pay | Admitting: Internal Medicine

## 2013-11-19 ENCOUNTER — Other Ambulatory Visit: Payer: Self-pay | Admitting: Internal Medicine

## 2013-12-13 ENCOUNTER — Other Ambulatory Visit: Payer: Self-pay | Admitting: Internal Medicine

## 2014-02-15 ENCOUNTER — Other Ambulatory Visit: Payer: Self-pay | Admitting: Internal Medicine

## 2014-05-24 ENCOUNTER — Ambulatory Visit (INDEPENDENT_AMBULATORY_CARE_PROVIDER_SITE_OTHER): Payer: BLUE CROSS/BLUE SHIELD | Admitting: Internal Medicine

## 2014-05-24 ENCOUNTER — Encounter: Payer: Self-pay | Admitting: Internal Medicine

## 2014-05-24 VITALS — BP 115/77 | HR 75 | Temp 98.7°F | Ht 69.0 in | Wt 163.2 lb

## 2014-05-24 DIAGNOSIS — E291 Testicular hypofunction: Secondary | ICD-10-CM

## 2014-05-24 DIAGNOSIS — L989 Disorder of the skin and subcutaneous tissue, unspecified: Secondary | ICD-10-CM

## 2014-05-24 DIAGNOSIS — Z0000 Encounter for general adult medical examination without abnormal findings: Secondary | ICD-10-CM

## 2014-05-24 NOTE — Progress Notes (Signed)
Pre visit review using our clinic review tool, if applicable. No additional management support is needed unless otherwise documented below in the visit note. 

## 2014-05-24 NOTE — Patient Instructions (Signed)
Stop by the front desk and schedule labs to be done within few days (fasting)  Check the  blood pressure 2 or 3 times a month   Be sure your blood pressure is between 110/65 and  145/85.  if it is consistently higher or lower, let me know    Please come back to the office  In 6-8 months  for a routine check up

## 2014-05-24 NOTE — Assessment & Plan Note (Addendum)
last TD 2010 Flu shot declined   2006  Cscope Dr Vladimir Faster Normal , apparently had another colonoscopy in 2009 with Dr. Paulita Fujita, will try to get records He has extremely healthy lifestyle except for tobacco abuse. Still smoke a pack a day. Risks of  tobacco discussed, recommend to see a dentist regularly which he does.   Other issues: History hepatitis C, status post treatment, was declared cure. Anxiety, symptoms well-controlled, with Xanax as needed prescribed by Dr. Reece Levy. Has a skin lesion at the left hand, refer to Dr. Valli Glance . Hypogonadism, on testosterone 1 pump daily, he again is concerned about weight loss and decreased muscle mass. I again recommend him to see endocrinology, will arrange. Hypertension, taking a very low dose of beta blockers, no change. BP is very good.
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
## 2014-05-25 ENCOUNTER — Other Ambulatory Visit (INDEPENDENT_AMBULATORY_CARE_PROVIDER_SITE_OTHER): Payer: BLUE CROSS/BLUE SHIELD

## 2014-05-25 DIAGNOSIS — Z0000 Encounter for general adult medical examination without abnormal findings: Secondary | ICD-10-CM

## 2014-05-25 DIAGNOSIS — E291 Testicular hypofunction: Secondary | ICD-10-CM

## 2014-05-25 LAB — LIPID PANEL
Cholesterol: 161 mg/dL (ref 0–200)
HDL: 45 mg/dL (ref >39.00)
LDL CALC: 97 mg/dL (ref 0–99)
NonHDL: 116
Total CHOL/HDL Ratio: 4
Triglycerides: 94 mg/dL (ref 0.0–149.0)
VLDL: 18.8 mg/dL (ref 0.0–40.0)

## 2014-05-25 LAB — COMPREHENSIVE METABOLIC PANEL
ALBUMIN: 4 g/dL (ref 3.5–5.2)
ALK PHOS: 61 U/L (ref 39–117)
ALT: 18 U/L (ref 0–53)
AST: 24 U/L (ref 0–37)
BUN: 22 mg/dL (ref 6–23)
CALCIUM: 9.9 mg/dL (ref 8.4–10.5)
CO2: 32 mEq/L (ref 19–32)
CREATININE: 1.05 mg/dL (ref 0.40–1.50)
Chloride: 102 mEq/L (ref 96–112)
GFR: 76.94 mL/min (ref >60.00)
Glucose, Bld: 100 mg/dL — ABNORMAL HIGH (ref 70–99)
Potassium: 4.9 mEq/L (ref 3.5–5.1)
Sodium: 138 mEq/L (ref 135–145)
TOTAL PROTEIN: 7.1 g/dL (ref 6.0–8.3)
Total Bilirubin: 0.5 mg/dL (ref 0.2–1.2)

## 2014-05-25 LAB — CBC WITH DIFFERENTIAL/PLATELET
BASOS PCT: 0.6 % (ref 0.0–3.0)
Basophils Absolute: 0.1 10*3/uL (ref 0.0–0.1)
EOS PCT: 2.3 % (ref 0.0–5.0)
Eosinophils Absolute: 0.2 10*3/uL (ref 0.0–0.7)
HEMATOCRIT: 45.9 % (ref 39.0–52.0)
Hemoglobin: 15.8 g/dL (ref 13.0–17.0)
LYMPHS PCT: 24.3 % (ref 12.0–46.0)
Lymphs Abs: 2.4 10*3/uL (ref 0.7–4.0)
MCHC: 34.3 g/dL (ref 30.0–36.0)
MCV: 95 fl (ref 78.0–100.0)
MONOS PCT: 6.9 % (ref 3.0–12.0)
Monocytes Absolute: 0.7 10*3/uL (ref 0.1–1.0)
Neutro Abs: 6.4 10*3/uL (ref 1.4–7.7)
Neutrophils Relative %: 65.9 % (ref 43.0–77.0)
Platelets: 167 10*3/uL (ref 150.0–400.0)
RBC: 4.84 Mil/uL (ref 4.22–5.81)
RDW: 14.1 % (ref 11.5–15.5)
WBC: 9.7 10*3/uL (ref 4.0–10.5)

## 2014-05-25 LAB — PSA: PSA: 0.36 ng/mL (ref 0.10–4.00)

## 2014-05-25 LAB — TSH: TSH: 3.24 u[IU]/mL (ref 0.35–4.50)

## 2014-05-26 LAB — TESTOSTERONE, FREE, TOTAL, SHBG
SEX HORMONE BINDING: 63 nmol/L (ref 22–77)
Testosterone, Free: 63 pg/mL (ref 47.0–244.0)
Testosterone-% Free: 1.3 % — ABNORMAL LOW (ref 1.6–2.9)
Testosterone: 474 ng/dL (ref 300–890)

## 2014-05-29 ENCOUNTER — Other Ambulatory Visit: Payer: Self-pay | Admitting: Internal Medicine

## 2014-06-27 ENCOUNTER — Ambulatory Visit: Payer: BLUE CROSS/BLUE SHIELD | Admitting: Endocrinology

## 2014-07-21 ENCOUNTER — Telehealth: Payer: Self-pay | Admitting: Internal Medicine

## 2014-07-21 MED ORDER — TESTOSTERONE 20.25 MG/ACT (1.62%) TD GEL: 1.0000 "application " | Freq: Every day | TRANSDERMAL | Status: DC

## 2014-07-21 NOTE — Telephone Encounter (Signed)
Okay for 6 months 

## 2014-07-21 NOTE — Telephone Encounter (Signed)
Rx printed, awaiting signature by Dr. Paz.  

## 2014-07-21 NOTE — Telephone Encounter (Signed)
Caller name: Nechemia, Chiappetta Relation to pt: self  Call back number: [phone] Pharmacy:  Reason for call:  Pt requesting a refill ANDROGEL PUMP 20.25 MG/ACT (1.62%) GEL

## 2014-07-21 NOTE — Telephone Encounter (Signed)
Faxed to [facility] pharmacy

## 2014-07-21 NOTE — Telephone Encounter (Signed)
Pt is requesting refill on Androgel pump.  Last OV: 05/24/2014 Last Fill: 10/10/2013 #75g # [id number]  Please advise.

## 2014-07-27 NOTE — Telephone Encounter (Signed)
Pt states pharmacy informed him that they did not received the fax for the rx, please resend

## 2014-07-27 NOTE — Telephone Encounter (Signed)
Received fax confirmation 07/21/2014 at 1040, however, will refax to [facility].

## 2014-07-27 NOTE — Telephone Encounter (Signed)
Rx refaxed to [facility], fax confirmation received 07/27/2014 at [place].

## 2014-07-31 ENCOUNTER — Telehealth: Payer: Self-pay | Admitting: *Deleted

## 2014-07-31 NOTE — Telephone Encounter (Signed)
Prior authorization for Androgel Pump initiated. Awaiting determination. JG//CMA

## 2014-08-04 NOTE — Telephone Encounter (Signed)
PA denied. Appeal initiated. Testosterone levels faxed with appeal. JG//CMA

## 2014-08-07 NOTE — Telephone Encounter (Signed)
Appeal approved effective 07/31/2014 through 04/20/2038. JG//CMA

## 2014-08-17 ENCOUNTER — Telehealth: Payer: Self-pay | Admitting: *Deleted

## 2014-08-17 NOTE — Telephone Encounter (Signed)
Signed medical record release received via fax from [place] requesting records from 2016. Requested records faxed to [phone] successfully. Medical release sent for scanning. JG//CMA

## 2014-09-14 ENCOUNTER — Other Ambulatory Visit: Payer: Self-pay | Admitting: Internal Medicine

## 2014-11-30 ENCOUNTER — Ambulatory Visit (INDEPENDENT_AMBULATORY_CARE_PROVIDER_SITE_OTHER): Payer: BLUE CROSS/BLUE SHIELD

## 2014-11-30 ENCOUNTER — Ambulatory Visit (INDEPENDENT_AMBULATORY_CARE_PROVIDER_SITE_OTHER): Payer: BLUE CROSS/BLUE SHIELD | Admitting: Family Medicine

## 2014-11-30 VITALS — BP 106/74 | HR 80 | Temp 97.8°F | Resp 18 | Ht 70.0 in | Wt 157.8 lb

## 2014-11-30 DIAGNOSIS — R05 Cough: Secondary | ICD-10-CM

## 2014-11-30 DIAGNOSIS — R059 Cough, unspecified: Secondary | ICD-10-CM

## 2014-11-30 DIAGNOSIS — J0100 Acute maxillary sinusitis, unspecified: Secondary | ICD-10-CM

## 2014-11-30 DIAGNOSIS — J988 Other specified respiratory disorders: Secondary | ICD-10-CM

## 2014-11-30 DIAGNOSIS — J309 Allergic rhinitis, unspecified: Secondary | ICD-10-CM

## 2014-11-30 DIAGNOSIS — J22 Unspecified acute lower respiratory infection: Secondary | ICD-10-CM

## 2014-11-30 MED ORDER — AMOXICILLIN-POT CLAVULANATE 875-125 MG PO TABS: 1.0000 | ORAL_TABLET | Freq: Two times a day (BID) | ORAL | Status: DC

## 2014-11-30 MED ORDER — ALBUTEROL SULFATE HFA 108 (90 BASE) MCG/ACT IN AERS: 2.0000 | INHALATION_SPRAY | Freq: Four times a day (QID) | RESPIRATORY_TRACT | Status: DC | PRN

## 2014-11-30 MED ORDER — AZITHROMYCIN 250 MG PO TABS: ORAL_TABLET | ORAL | Status: DC

## 2014-11-30 NOTE — Patient Instructions (Addendum)
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
## 2014-12-10 ENCOUNTER — Telehealth: Payer: Self-pay

## 2014-12-10 NOTE — Telephone Encounter (Signed)
Pt isn't feeling any better from his visit with Dr. Marin Comment on 8/11. He would like to get another refill on his azithromycin (ZITHROMAX) 250 MG tablet [[id number]] script. Please advise at [phone]

## 2014-12-11 NOTE — Telephone Encounter (Signed)
Called the number left below and it was not the pt's cell and pt was not returning there soon. 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 Got his personal VM message and LM that I am worried that he is not feeling any better and that he needs to come back for re-check if he has not improved at all.

## 2015-01-22 ENCOUNTER — Ambulatory Visit (INDEPENDENT_AMBULATORY_CARE_PROVIDER_SITE_OTHER): Payer: BLUE CROSS/BLUE SHIELD | Admitting: Internal Medicine

## 2015-01-22 ENCOUNTER — Ambulatory Visit (HOSPITAL_BASED_OUTPATIENT_CLINIC_OR_DEPARTMENT_OTHER)
Admission: RE | Admit: 2015-01-22 | Discharge: 2015-01-22 | Disposition: A | Discharge status: Home / Self Care | Payer: BLUE CROSS/BLUE SHIELD | Source: Ambulatory Visit | Attending: Internal Medicine | Admitting: Internal Medicine

## 2015-01-22 ENCOUNTER — Encounter: Payer: Self-pay | Admitting: Internal Medicine

## 2015-01-22 VITALS — BP 120/64 | HR 64 | Temp 97.9°F | Ht 70.0 in | Wt 159.0 lb

## 2015-01-22 DIAGNOSIS — Z23 Encounter for immunization: Secondary | ICD-10-CM | POA: Diagnosis not present

## 2015-01-22 DIAGNOSIS — F17200 Nicotine dependence, unspecified, uncomplicated: Secondary | ICD-10-CM | POA: Insufficient documentation

## 2015-01-22 DIAGNOSIS — J449 Chronic obstructive pulmonary disease, unspecified: Secondary | ICD-10-CM | POA: Diagnosis not present

## 2015-01-22 DIAGNOSIS — J189 Pneumonia, unspecified organism: Secondary | ICD-10-CM | POA: Diagnosis not present

## 2015-01-22 DIAGNOSIS — Z09 Encounter for follow-up examination after completed treatment for conditions other than malignant neoplasm: Secondary | ICD-10-CM

## 2015-01-22 DIAGNOSIS — L989 Disorder of the skin and subcutaneous tissue, unspecified: Secondary | ICD-10-CM
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
## 2015-01-22 NOTE — Patient Instructions (Signed)
Stop by the first floor and get the XR    Next visit  for a   physical exam, fasting in 4 months     Please schedule an appointment at the front desk

## 2015-01-22 NOTE — Progress Notes (Signed)
Pre visit review using our clinic review tool, if applicable. No additional management support is needed unless otherwise documented below in the visit note. 

## 2015-01-22 NOTE — Assessment & Plan Note (Signed)
HTN: Continue metoprolol, well-controlled. Pneumonia: per recent chest x-ray, status post antibiotics, recheck a chest x-ray Tobacco abuse: Not ready to quit, counseled,to call if he ever desires help with medication;  no chronic resp sx;  Hypogonadism: On HRT, continue present care Skin lesion R hand-- see previous OV note, needs to reschedule w/ derm Primary care: flu shot, pneumonia shot today Return to the office 4 months

## 2015-01-23 ENCOUNTER — Telehealth: Payer: Self-pay | Admitting: Internal Medicine

## 2015-01-23 NOTE — Telephone Encounter (Signed)
Pt said to call [phone]. You can leave msg.

## 2015-01-23 NOTE — Telephone Encounter (Signed)
Notes Recorded by Wilfrid Lund, CMA on 05/29/2014 at 9:09 AM Letter printed and mailed to Pt. Notes Recorded by Colon Branch, MD on 05/28/2014 at 8:57 AM Send a letter Ricardo Fisher,  Your cholesterol and prostate tests are normal. Your testosterone is well balanced. All other tests are normal. In summary these are great results, continue taking all the medications as you're doing, I am interested to see what the endocrinologist will say about weight loss and decreased muscle mass

## 2015-01-24 ENCOUNTER — Other Ambulatory Visit: Payer: Self-pay

## 2015-01-24 DIAGNOSIS — R9389 Abnormal findings on diagnostic imaging of other specified body structures: Secondary | ICD-10-CM

## 2015-01-24 MED ORDER — MOXIFLOXACIN HCL 400 MG PO TABS: 400.0000 mg | ORAL_TABLET | Freq: Every day | ORAL | Status: DC

## 2015-01-25 ENCOUNTER — Telehealth: Payer: Self-pay

## 2015-01-25 NOTE — Telephone Encounter (Signed)
Pt returned call regarding chest x-ray results. Informed him that his chest x-ray showed further infiltrate, meaning it could be pneumonia or bronchitis. Informed him that we sent a prescription for Avelox he is to take 1 tablet by mouth daily for 7 days as well as recommend him to see pulmonary. Pt verbalized understanding and informed me he has already picked up antibiotics.

## 2015-01-29 ENCOUNTER — Encounter: Payer: Self-pay | Admitting: Internal Medicine

## 2015-01-29 ENCOUNTER — Ambulatory Visit (INDEPENDENT_AMBULATORY_CARE_PROVIDER_SITE_OTHER): Payer: BLUE CROSS/BLUE SHIELD | Admitting: Internal Medicine

## 2015-01-29 VITALS — BP 112/74 | HR 66 | Ht 71.0 in | Wt 163.2 lb

## 2015-01-29 DIAGNOSIS — Z720 Tobacco use: Secondary | ICD-10-CM

## 2015-01-29 DIAGNOSIS — J189 Pneumonia, unspecified organism: Secondary | ICD-10-CM

## 2015-01-29 DIAGNOSIS — F17210 Nicotine dependence, cigarettes, uncomplicated: Secondary | ICD-10-CM

## 2015-01-29 NOTE — Progress Notes (Signed)
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   01/29/2015 1st [facility] office visit/ [facility]   Chief Complaint  Patient presents with  . Advice Only    referred by Dr. Larose Kells; head congestion, chest congestion, Chest Xray shows Pneumonia/Bronchitis. no cough, no trouble breathing.  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   acute symptoms all better p rx except for some nasal  Congestion. No sob/ cough/ fever  No obvious day to day or daytime variability or assoc  cp or chest tightness, subjective wheeze or overt   hb symptoms. No unusual exp hx or h/o childhood pna/ asthma or knowledge of premature birth.  Sleeping ok without nocturnal  or early am exacerbation  of respiratory  c/o's or need for noct saba. Also denies any obvious fluctuation of symptoms with weather or environmental changes or other aggravating or alleviating factors except as outlined above   Current Medications, Allergies, Complete Past Medical History, Past Surgical History, Family History, and Social History were reviewed in [facility] record.  ROS  The following are not active complaints unless bolded sore throat, dysphagia, dental problems, itching, sneezing,  nasal congestion or excess/ purulent secretions, ear ache,   fever, chills, sweats, unintended wt loss, classically pleuritic or exertional cp, hemoptysis,  orthopnea pnd or leg swelling, presyncope, palpitations, abdominal pain, anorexia, nausea, vomiting,  diarrhea  or change in bowel or bladder habits, change in stools or urine, dysuria,hematuria,  rash, arthralgias, visual complaints, headache, numbness, weakness or ataxia or problems with walking or coordination,  change in mood/affect or memory.          Review of Systems     Objective:   Physical Exam    amb wm nad   Wt Readings from Last 3 Encounters:  01/29/15 163 lb 3.2 oz (74.027 kg)  01/22/15 159 lb (72.122 kg)  11/30/14 157 lb 12.8 oz (71.578 kg)    Vital signs reviewed    HEENT: nl dentition, turbinates, and orophanx. Nl external ear canals without cough reflex   NECK :  without JVD/Nodes/TM/ nl carotid upstrokes bilaterally   LUNGS: no acc muscle use, clear to A and P bilaterally with very minimal Rhonchi L lateral/ anterior   CV:  RRR  no s3 or murmur or increase in P2, no edema   ABD:  soft and nontender with nl excursion in the supine position. No bruits or organomegaly, bowel sounds nl  MS:  warm without deformities, calf tenderness, cyanosis or clubbing  SKIN: warm and dry without lesions    NEURO:  alert, approp, no deficits     I personally reviewed images and agree with radiology impression as follows:  CXR:  01/22/15  1. Probable mild pneumonia in the lingula with mild progression. 2. Changes of COPD and chronic bronchitis.    Assessment:

## 2015-01-29 NOTE — Patient Instructions (Signed)
The key is to stop smoking completely before smoking completely stops you!   Best cough medicine > mucinex and mucinex dm 1200 mg every 12 hours as needed   Return first week in November 2016  With cxr on return  - call in meantime if losing ground

## 2015-01-30 DIAGNOSIS — J189 Pneumonia, unspecified organism: Secondary | ICD-10-CM | POA: Insufficient documentation

## 2015-01-30 DIAGNOSIS — F17210 Nicotine dependence, cigarettes, uncomplicated: Secondary | ICD-10-CM | POA: Insufficient documentation

## 2015-01-30 NOTE — Assessment & Plan Note (Signed)
>   3 min discussion I reviewed the Fletcher curve with the patient that basically indicates  if you quit smoking when your best day FEV1 is still well preserved (as appears to be  the case here)  it is highly unlikely you will progress to severe disease and informed the patient there was no medication on the market that has proven to alter the curve/ its downward trajectory  or the likelihood of progression of their disease.  Therefore stopping smoking and maintaining abstinence is the most important aspect of care, not choice of inhalers or for that matter, doctors.    In addition he is very concerned about lung cancer and I explained to him that his risk of lung cancer begins to drop the day stops smoking and will continue decline over 15 years. During that time he is a candidate for lung cancer screening if he wishes but he is very much concerned about radiation exposure at this point and it was hard to talk him into even a chest x-ray on return.

## 2015-01-30 NOTE — Assessment & Plan Note (Addendum)
Onset of symptoms Sept 1 2016 rx Zpack then Avelox 10/5 - 01/31/15   The history and chest x-ray are perfectly consistent with a community-acquired pneumonia involving the lingula. I explained the anatomy of the lingula which is similar to the right middle lobe and tends to require a longer time to recover from what would've otherwise been a simple bacterial lobar pneumonia due to poor collateral ventilation. He is actually doing great at this point I would not recommend anything further other than smoking cessation (see sep a/p) and a follow-up chest x-ray in 4 weeks, unless symptoms indicate otherwise in the meantime  Total time = 54m review case with pt/ discussion/ counseling/ giving and going over instructions (see avs)

## 2015-02-07 ENCOUNTER — Other Ambulatory Visit: Payer: Self-pay | Admitting: Internal Medicine

## 2015-02-07 NOTE — Telephone Encounter (Signed)
Yes, 1 and 5 RF

## 2015-02-07 NOTE — Telephone Encounter (Signed)
Rx faxed to [facility] pharmacy.  

## 2015-02-07 NOTE — Telephone Encounter (Signed)
Pt is requesting refill on Androgel.  Last OV: 01/22/2015 Last Fill: 07/21/2014 #75g and 5RF  Okay to refill?

## 2015-02-07 NOTE — Telephone Encounter (Signed)
Rx printed, awaiting MD signature.  

## 2015-02-20 ENCOUNTER — Ambulatory Visit (INDEPENDENT_AMBULATORY_CARE_PROVIDER_SITE_OTHER)
Admission: RE | Admit: 2015-02-20 | Discharge: 2015-02-20 | Disposition: A | Discharge status: Home / Self Care | Payer: BLUE CROSS/BLUE SHIELD | Source: Ambulatory Visit | Attending: Internal Medicine | Admitting: Internal Medicine

## 2015-02-20 ENCOUNTER — Encounter: Payer: Self-pay | Admitting: Internal Medicine

## 2015-02-20 ENCOUNTER — Other Ambulatory Visit (INDEPENDENT_AMBULATORY_CARE_PROVIDER_SITE_OTHER): Payer: BLUE CROSS/BLUE SHIELD

## 2015-02-20 ENCOUNTER — Ambulatory Visit (INDEPENDENT_AMBULATORY_CARE_PROVIDER_SITE_OTHER): Payer: BLUE CROSS/BLUE SHIELD | Admitting: Internal Medicine

## 2015-02-20 VITALS — BP 130/76 | HR 68 | Ht 71.0 in | Wt 162.8 lb

## 2015-02-20 DIAGNOSIS — J189 Pneumonia, unspecified organism: Secondary | ICD-10-CM | POA: Diagnosis not present

## 2015-02-20 DIAGNOSIS — J3089 Other allergic rhinitis: Secondary | ICD-10-CM

## 2015-02-20 DIAGNOSIS — F17210 Nicotine dependence, cigarettes, uncomplicated: Secondary | ICD-10-CM | POA: Diagnosis not present

## 2015-02-20 LAB — CBC WITH DIFFERENTIAL/PLATELET
Basophils Absolute: 0.1 10*3/uL (ref 0.0–0.1)
Basophils Relative: 0.7 % (ref 0.0–3.0)
EOS PCT: 2.3 % (ref 0.0–5.0)
Eosinophils Absolute: 0.2 10*3/uL (ref 0.0–0.7)
HEMATOCRIT: 43.7 % (ref 39.0–52.0)
HEMOGLOBIN: 14.5 g/dL (ref 13.0–17.0)
LYMPHS PCT: 27.8 % (ref 12.0–46.0)
Lymphs Abs: 2.7 10*3/uL (ref 0.7–4.0)
MCHC: 33.1 g/dL (ref 30.0–36.0)
MCV: 93.5 fl (ref 78.0–100.0)
MONOS PCT: 7 % (ref 3.0–12.0)
Monocytes Absolute: 0.7 10*3/uL (ref 0.1–1.0)
Neutro Abs: 6.1 10*3/uL (ref 1.4–7.7)
Neutrophils Relative %: 62.2 % (ref 43.0–77.0)
Platelets: 198 10*3/uL (ref 150.0–400.0)
RBC: 4.68 Mil/uL (ref 4.22–5.81)
RDW: 14.3 % (ref 11.5–15.5)
WBC: 9.9 10*3/uL (ref 4.0–10.5)

## 2015-02-20 NOTE — Progress Notes (Signed)
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   01/29/2015 1st [facility] office visit/ Ricardo Fisher   Chief Complaint  Patient presents with  . Advice Only    referred by Dr. Larose Fisher; head congestion, chest congestion, Chest Xray shows Pneumonia/Bronchitis. no cough, no trouble breathing.  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   acute symptoms all better p rx except for some nasal  Congestion. No sob/ cough/ fever rec The key is to stop smoking completely before smoking completely stops you!  Best cough medicine > mucinex and mucinex dm 1200 mg every 12 hours as needed  Return first week in November 2016  With cxr on return  - call in meantime if losing ground     02/20/2015  f/u ov/Ricardo Fisher re recurrent stuffy head   Chief Complaint  Patient presents with  . Follow-up    Pt states he had improved some after last visit, but then a wk ago started having PND and sinus congestion.    states 100% better prior to playing/working outside one week prior to OV  With mostly nasal symptoms now/ noted for sev years   No obvious day to day or daytime variability or assoc  cp or chest tightness, subjective wheeze or overt   hb symptoms. No unusual exp hx or h/o childhood pna/ asthma or knowledge of premature birth.  Sleeping ok without nocturnal  or early am exacerbation  of respiratory  c/o's or need for noct saba. Also denies any obvious fluctuation of symptoms with weather or environmental changes or  other aggravating or alleviating factors except as outlined above   Current Medications, Allergies, Complete Past Medical History, Past Surgical History, Family History, and Social History were reviewed in [facility] record.  ROS  The following are not active complaints unless bolded sore throat, dysphagia, dental problems, itching, sneezing,  nasal congestion or excess/ purulent secretions, ear ache,   fever, chills, sweats, unintended wt loss, classically pleuritic or exertional cp, hemoptysis,  orthopnea pnd or leg swelling, presyncope, palpitations, abdominal pain, anorexia, nausea, vomiting, diarrhea  or change in bowel or bladder habits, change in stools or urine, dysuria,hematuria,  rash, arthralgias, visual complaints, headache, numbness, weakness or ataxia or problems with walking or coordination,  change in mood/affect or memory.               Objective:   Physical Exam    amb wm nad/ nasal tone to voice   02/20/2015        163  Wt Readings from Last 3 Encounters:  01/29/15 163 lb 3.2 oz (74.027 kg)  01/22/15 159 lb (72.122 kg)  11/30/14 157 lb 12.8 oz (71.578 kg)    Vital signs reviewed    HEENT: nl dentition, turbinates, and orophanx. Nl external ear canals without cough reflex   NECK :  without JVD/Nodes/TM/ nl carotid upstrokes bilaterally   LUNGS: no acc muscle  use, clear to A and P bilaterally     CV:  RRR  no s3 or murmur or increase in P2, no edema   ABD:  soft and nontender with nl excursion in the supine position. No bruits or organomegaly, bowel sounds nl  MS:  warm without deformities, calf tenderness, cyanosis or clubbing  SKIN: warm and dry without lesions    NEURO:  alert, approp, no deficits     I personally reviewed images and agree with radiology impression as follows:  CXR 02/20/2015   Minimally prominent markings remain in the region of the lingula although this area has improved in the interval.  Labs ordered :   Allergy profile    Assessment:

## 2015-02-20 NOTE — Patient Instructions (Signed)
Please see patient coordinator before you leave today  to schedule sinus CT   Please remember to go to the lab and  x-ray department downstairs for your tests - we will call you with the results when they are available  I emphasized that nasal steroids have no immediate benefit in terms of improving symptoms.  To help them reached the target tissue, the patient should use Afrin two puffs every 12 hours applied one min before using the nasal steroids.  Afrin should be stopped after no more than 5 days.  If the symptoms worsen, Afrin can be restarted after 5 days off of therapy to prevent rebound congestion from overuse of Afrin.  I also emphasized that in no way are nasal steroids a concern in terms of "addiction".

## 2015-02-21 NOTE — Progress Notes (Signed)
Quick Note:  LMTCB ______ 

## 2015-02-22 ENCOUNTER — Telehealth: Payer: Self-pay | Admitting: Internal Medicine

## 2015-02-22 LAB — ALLERGY FULL PROFILE
ALLERGEN, D PTERNOYSSINUS, D1: 0.13 kU/L — AB
Alternaria Alternata: <0.1 kU/L
Aspergillus fumigatus, m3: <0.1 kU/L
Bahia Grass: <0.1 kU/L
Bermuda Grass: <0.1 kU/L
Cat Dander: <0.1 kU/L
Curvularia lunata: <0.1 kU/L
D. FARINAE: 0.12 kU/L — AB
Elm IgE: <0.1 kU/L
Fescue: <0.1 kU/L
G005 Rye, Perennial: <0.1 kU/L
G009 Red Top: <0.1 kU/L
Helminthosporium halodes: <0.1 kU/L
House Dust Hollister: <0.1 kU/L
IGE (IMMUNOGLOBULIN E), SERUM: 30 kU/L (ref <115)
Lamb's Quarters: <0.1 kU/L
Oak: <0.1 kU/L
Plantain: <0.1 kU/L
Sycamore Tree: <0.1 kU/L

## 2015-02-22 NOTE — Progress Notes (Signed)
Quick Note:  lmtcb ______ 

## 2015-02-22 NOTE — Telephone Encounter (Signed)
Per CXR results: Result Note     Call pt: Reviewed cxr and all acute changes have resolved c/w healing pna  --  Pt also requesting lab results. Please advise MW thanks

## 2015-02-22 NOTE — Progress Notes (Signed)
Quick Note:  Called and spoke with pt. Reviewed results and recs. Pt voiced understanding and had no further questions. ______ 

## 2015-02-22 NOTE — Telephone Encounter (Signed)
Lab results from ov on  02/20/2015 with MW  Notes Recorded by Rosana Berger, CMA on 02/22/2015 at 11:21 AM lmtcb Notes Recorded by Tanda Rockers, MD on 02/22/2015 at 10:54 AM Call patient : Studies are unremarkable x minimal allergies with dust   Called and spoke with pt. Reviewed lab results. Pt voiced understanding and had no further questions. Nothing further needed.

## 2015-02-22 NOTE — Telephone Encounter (Signed)
Please advise on labs.. thanks.

## 2015-02-22 NOTE — Telephone Encounter (Signed)
Pt returned call, says he will call back in an hour or so.Hillery Hunter

## 2015-02-23 ENCOUNTER — Other Ambulatory Visit: Payer: BLUE CROSS/BLUE SHIELD

## 2015-02-23 NOTE — Assessment & Plan Note (Signed)
Onset of symptoms Sept 1 2016 rx Zpack then Avelox 10/5 - 01/31/15 > cleared 02/20/2015   No further abx or cxr's planned at this point

## 2015-02-23 NOTE — Assessment & Plan Note (Signed)
>   3 m  I took an extended  opportunity with this patient to outline the consequences of continued cigarette use  in airway disorders based on all the data we have from the multiple national lung health studies (perfomed over decades at millions of dollars in cost)  indicating that smoking cessation, not choice of inhalers or physicians, is the most important aspect of care.   

## 2015-02-23 NOTE — Assessment & Plan Note (Addendum)
Allergy profile 02/20/15 >  IgE 30 , min pos RAST to dust  - Sinus CT rec 02/20/2015   Reviewed approp use of nasal steroids > See instructions for specific recommendations which were reviewed directly with the patient who was given a copy with highlighter outlining the key components.   Note his chronic sinus problems may be the cause of many of his lower resp tract symptoms and stopping smoking should help this as well  I had an extended final summary  discussion with the patient reviewing all relevant studies completed to date and  lasting 15 to 20 minutes of a 25 minute visit    Each maintenance medication was reviewed in detail including most importantly the difference between maintenance and prns and under what circumstances the prns are to be triggered using an action plan format that is not reflected in the computer generated alphabetically organized AVS.    Please see instructions for details which were reviewed in writing and the patient given a copy highlighting the part that I personally wrote and discussed at today's ov.

## 2015-05-30 ENCOUNTER — Encounter: Payer: Self-pay | Admitting: *Deleted

## 2015-05-30 ENCOUNTER — Telehealth: Payer: Self-pay | Admitting: *Deleted

## 2015-05-30 NOTE — Telephone Encounter (Signed)
Pre-Visit Call completed with patient and chart updated.   Pre-Visit Info documented in Specialty Comments under [employer].    

## 2015-05-31 ENCOUNTER — Telehealth: Payer: Self-pay | Admitting: Internal Medicine

## 2015-05-31 ENCOUNTER — Ambulatory Visit (INDEPENDENT_AMBULATORY_CARE_PROVIDER_SITE_OTHER): Payer: BLUE CROSS/BLUE SHIELD | Admitting: Internal Medicine

## 2015-05-31 ENCOUNTER — Encounter: Payer: Self-pay | Admitting: Internal Medicine

## 2015-05-31 VITALS — BP 126/74 | HR 80 | Temp 97.9°F | Ht 71.0 in | Wt 164.4 lb

## 2015-05-31 DIAGNOSIS — Z0000 Encounter for general adult medical examination without abnormal findings: Secondary | ICD-10-CM

## 2015-05-31 DIAGNOSIS — E291 Testicular hypofunction: Secondary | ICD-10-CM

## 2015-05-31 DIAGNOSIS — E785 Hyperlipidemia, unspecified: Secondary | ICD-10-CM

## 2015-05-31 DIAGNOSIS — Z9289 Personal history of other medical treatment: Secondary | ICD-10-CM

## 2015-05-31 DIAGNOSIS — I10 Essential (primary) hypertension: Secondary | ICD-10-CM | POA: Diagnosis not present

## 2015-05-31 LAB — COMPLETE METABOLIC PANEL WITH GFR
ALT: 18 U/L (ref 9–46)
AST: 27 U/L (ref 10–35)
Albumin: 3.9 g/dL (ref 3.6–5.1)
Alkaline Phosphatase: 48 U/L (ref 40–115)
BUN: 17 mg/dL (ref 7–25)
CHLORIDE: 101 mmol/L (ref 98–110)
CO2: 29 mmol/L (ref 20–31)
CREATININE: 0.91 mg/dL (ref 0.70–1.33)
Calcium: 9.5 mg/dL (ref 8.6–10.3)
GFR, Est Non African American: >89 mL/min (ref >=60)
Glucose, Bld: 106 mg/dL — ABNORMAL HIGH (ref 65–99)
POTASSIUM: 4.6 mmol/L (ref 3.5–5.3)
Sodium: 140 mmol/L (ref 135–146)
Total Bilirubin: 0.5 mg/dL (ref 0.2–1.2)
Total Protein: 7.4 g/dL (ref 6.1–8.1)

## 2015-05-31 LAB — LIPID PANEL
Cholesterol: 187 mg/dL (ref 0–200)
HDL: 46.3 mg/dL (ref >39.00)
LDL CALC: 116 mg/dL — AB (ref 0–99)
NonHDL: 140.81
TRIGLYCERIDES: 126 mg/dL (ref 0.0–149.0)
Total CHOL/HDL Ratio: 4
VLDL: 25.2 mg/dL (ref 0.0–40.0)

## 2015-05-31 LAB — TSH: TSH: 2.74 u[IU]/mL (ref 0.35–4.50)

## 2015-05-31 LAB — HIV ANTIBODY (ROUTINE TESTING W REFLEX): HIV: NONREACTIVE

## 2015-05-31 NOTE — Assessment & Plan Note (Addendum)
Td  2010; pnm 23 01-2015; flu shot declined   2006  Cscope Dr Vladimir Faster Normal , reports had another colonoscopy in 2009 with Dr. Paulita Fujita, unable to get records, reportedly normal Prostate ca screening: DRE normal, check a PSA He has extremely healthy lifestyle  Tobacco: Counseled again Labs: CMP, FLP, TSH, free testosterone.

## 2015-05-31 NOTE — Telephone Encounter (Signed)
Awaiting Testosterone lab results for refills.

## 2015-05-31 NOTE — Patient Instructions (Signed)
GO TO THE LAB : Get the blood work    GO TO THE FRONT DESK Schedule a routine office visit or check up to be done in  6 MONTHS  NO  fasting    Steps to Quit Smoking  Smoking tobacco can be harmful to your health and can affect almost every organ in your body. Smoking puts you, and those around you, at risk for developing many serious chronic diseases. Quitting smoking is difficult, but it is one of the best things that you can do for your health. It is never too late to quit. WHAT ARE THE BENEFITS OF QUITTING SMOKING? When you quit smoking, you lower your risk of developing serious diseases and conditions, such as:  Lung cancer or lung disease, such as COPD.  Heart disease.  Stroke.  Heart attack.  Infertility.  Osteoporosis and bone fractures. Additionally, symptoms such as coughing, wheezing, and shortness of breath may get better when you quit. You may also find that you get sick less often because your body is stronger at fighting off colds and infections. If you are pregnant, quitting smoking can help to reduce your chances of having a baby of low birth weight. HOW DO I GET READY TO QUIT? When you decide to quit smoking, create a plan to make sure that you are successful. Before you quit:  Pick a date to quit. Set a date within the next two weeks to give you time to prepare.  Write down the reasons why you are quitting. Keep this list in places where you will see it often, such as on your bathroom mirror or in your car or wallet.  Identify the people, places, things, and activities that make you want to smoke (triggers) and avoid them. Make sure to take these actions:  Throw away all cigarettes at home, at work, and in your car.  Throw away smoking accessories, such as Scientist, research (medical).  Clean your car and make sure to empty the ashtray.  Clean your home, including curtains and carpets.  Tell your family, friends, and coworkers that you are quitting. Support from  your loved ones can make quitting easier.  Talk with your health care provider about your options for quitting smoking.  Find out what treatment options are covered by your health insurance. WHAT STRATEGIES CAN I USE TO QUIT SMOKING?  Talk with your healthcare provider about different strategies to quit smoking. Some strategies include:  Quitting smoking altogether instead of gradually lessening how much you smoke over a period of time. Research shows that quitting "cold [place]" is more successful than gradually quitting.  Attending in-person counseling to help you build problem-solving skills. You are more likely to have success in quitting if you attend several counseling sessions. Even short sessions of 10 minutes can be effective.  Finding resources and support systems that can help you to quit smoking and remain smoke-free after you quit. These resources are most helpful when you use them often. They can include:  Online chats with a Social worker.  Telephone quitlines.  Printed Furniture conservator/restorer.  Support groups or group counseling.  Text messaging programs.  Mobile phone applications.  Taking medicines to help you quit smoking. (If you are pregnant or breastfeeding, talk with your health care provider first.) Some medicines contain nicotine and some do not. Both types of medicines help with cravings, but the medicines that include nicotine help to relieve withdrawal symptoms. Your health care provider may recommend:  Nicotine patches, gum, or lozenges.  Nicotine inhalers or sprays.  Non-nicotine medicine that is taken by mouth. Talk with your health care provider about combining strategies, such as taking medicines while you are also receiving in-person counseling. Using these two strategies together makes you more likely to succeed in quitting than if you used either strategy on its own. If you are pregnant or breastfeeding, talk with your health care provider about finding  counseling or other support strategies to quit smoking. Do not take medicine to help you quit smoking unless told to do so by your health care provider. WHAT THINGS CAN I DO TO MAKE IT EASIER TO QUIT? Quitting smoking might feel overwhelming at first, but there is a lot that you can do to make it easier. Take these important actions:  Reach out to your family and friends and ask that they support and encourage you during this time. Call telephone quitlines, reach out to support groups, or work with a counselor for support.  Ask people who smoke to avoid smoking around you.  Avoid places that trigger you to smoke, such as bars, parties, or smoke-break areas at work.  Spend time around people who do not smoke.  Lessen stress in your life, because stress can be a smoking trigger for some people. To lessen stress, try:  Exercising regularly.  Deep-breathing exercises.  Yoga.  Meditating.  Performing a body scan. This involves closing your eyes, scanning your body from head to toe, and noticing which parts of your body are particularly tense. Purposefully relax the muscles in those areas.  Download or purchase mobile phone or tablet apps (applications) that can help you stick to your quit plan by providing reminders, tips, and encouragement. There are many free apps, such as QuitGuide from the [employer] Office manager for Disease Control and Prevention). You can find other support for quitting smoking (smoking cessation) through smokefree.gov and other websites. HOW WILL I FEEL WHEN I QUIT SMOKING? Within the first 24 hours of quitting smoking, you may start to feel some withdrawal symptoms. These symptoms are usually most noticeable 2-3 days after quitting, but they usually do not last beyond 2-3 weeks. Changes or symptoms that you might experience include:  Mood swings.  Restlessness, anxiety, or irritation.  Difficulty concentrating.  Dizziness.  Strong cravings for sugary foods in addition to  nicotine.  Mild weight gain.  Constipation.  Nausea.  Coughing or a sore throat.  Changes in how your medicines work in your body.  A depressed mood.  Difficulty sleeping (insomnia). After the first 2-3 weeks of quitting, you may start to notice more positive results, such as:  Improved sense of smell and taste.  Decreased coughing and sore throat.  Slower heart rate.  Lower blood pressure.  Clearer skin.  The ability to breathe more easily.  Fewer sick days. Quitting smoking is very challenging for most people. Do not get discouraged if you are not successful the first time. Some people need to make many attempts to quit before they achieve long-term success. Do your best to stick to your quit plan, and talk with your health care provider if you have any questions or concerns.   This information is not intended to replace advice given to you by your health care provider. Make sure you discuss any questions you have with your health care provider.   Document Released: 04/01/2001 Document Revised: 08/22/2014 Document Reviewed: 08/22/2014 Elsevier Interactive Patient Education [employer].
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
## 2015-05-31 NOTE — Progress Notes (Signed)
Pre visit review using our clinic review tool, if applicable. No additional management support is needed unless otherwise documented below in the visit note. 

## 2015-05-31 NOTE — Telephone Encounter (Signed)
Pharmacy: [id number], [place]  Reason for call: pt needs refill on androgel Testosterone

## 2015-06-04 ENCOUNTER — Telehealth: Payer: Self-pay | Admitting: Internal Medicine

## 2015-06-04 LAB — TESTOS,TOTAL,FREE AND SHBG (FEMALE)
SEX HORMONE BINDING GLOB.: 73 nmol/L (ref 22–77)
TESTOSTERONE,FREE: 66.4 pg/mL (ref 35.0–155.0)
Testosterone,Total,LC/MS/MS: 609 ng/dL (ref 250–1100)

## 2015-06-04 NOTE — Telephone Encounter (Signed)
Results not reviewed and released yet, will call Pt when they become available to me.

## 2015-06-04 NOTE — Telephone Encounter (Signed)
Reviewed med list, Androgel was sent to [facility] in 01/2015 #75g and 5 refills. Should not need refills until April 2017.

## 2015-06-04 NOTE — Telephone Encounter (Signed)
Relation to [id number] Call back [place]   Reason for call:  Patient requesting lab results

## 2015-06-05 NOTE — Telephone Encounter (Signed)
LMOM informing Pt of lab results, informed him that labs have also been mailed to him. Instructed him to call if any questions or concerns.

## 2015-06-18 ENCOUNTER — Telehealth: Payer: Self-pay | Admitting: Internal Medicine

## 2015-06-18 MED ORDER — METOPROLOL SUCCINATE ER 25 MG PO TB24: 25.0000 mg | ORAL_TABLET | Freq: Every day | ORAL | Status: DC

## 2015-06-18 NOTE — Telephone Encounter (Signed)
Pt received lab results and he was wanting to know if he can still come in for hepatic function testing and it be covered under his physical since it was not done??? Please notify at [phone].  Pt is also needing refill on Metoprolol. He has one week left. Send to [place], [place].

## 2015-06-18 NOTE — Telephone Encounter (Signed)
Recorded msg "the wireless customer you are calling is not available, please try again later"

## 2015-06-18 NOTE — Telephone Encounter (Signed)
Rx sent. Please inform Pt that CMP was performed on 05/31/2015 which includes the liver functions, AST, ALT, ALP (Alkaline Phosphatase), and Total Bilirubin which is the breakdown of a Hepatic Panel.

## 2015-06-19 NOTE — Telephone Encounter (Signed)
Notified pt of RX and results to look at for kidney fx.

## 2015-08-21 ENCOUNTER — Telehealth: Payer: Self-pay | Admitting: Internal Medicine

## 2015-08-21 MED ORDER — TESTOSTERONE 20.25 MG/ACT (1.62%) TD GEL
1.0000 | Freq: Every day | TRANSDERMAL | Status: DC
Start: 2015-08-21 — End: 2015-10-31

## 2015-08-21 NOTE — Telephone Encounter (Signed)
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 Pharmacy was to request last week. He has a couple days worth left.

## 2015-08-21 NOTE — Telephone Encounter (Signed)
Rx printed and faxed to the pharmacy.  Confirmation received.//AB/CMA

## 2015-08-21 NOTE — Telephone Encounter (Signed)
Requesting Testosterone gel 20.25mg /ACT (0000000 1 application topically daily. Last refill: 02/07/15;75g,5 Last OV:05/31/15 Please advise.//AB/CMA

## 2015-08-21 NOTE — Telephone Encounter (Signed)
Okay refill testosterone for 6 months

## 2015-10-29 IMAGING — CR DG CHEST 2V
2 series · 2 of 2 positions shown · non-contrast
Comparison: Chest x-rays dated 02/20/2010 and 10/12/2007

CLINICAL DATA: Productive cough.  Fever.

EXAM:
CHEST  2 VIEW

[PA]
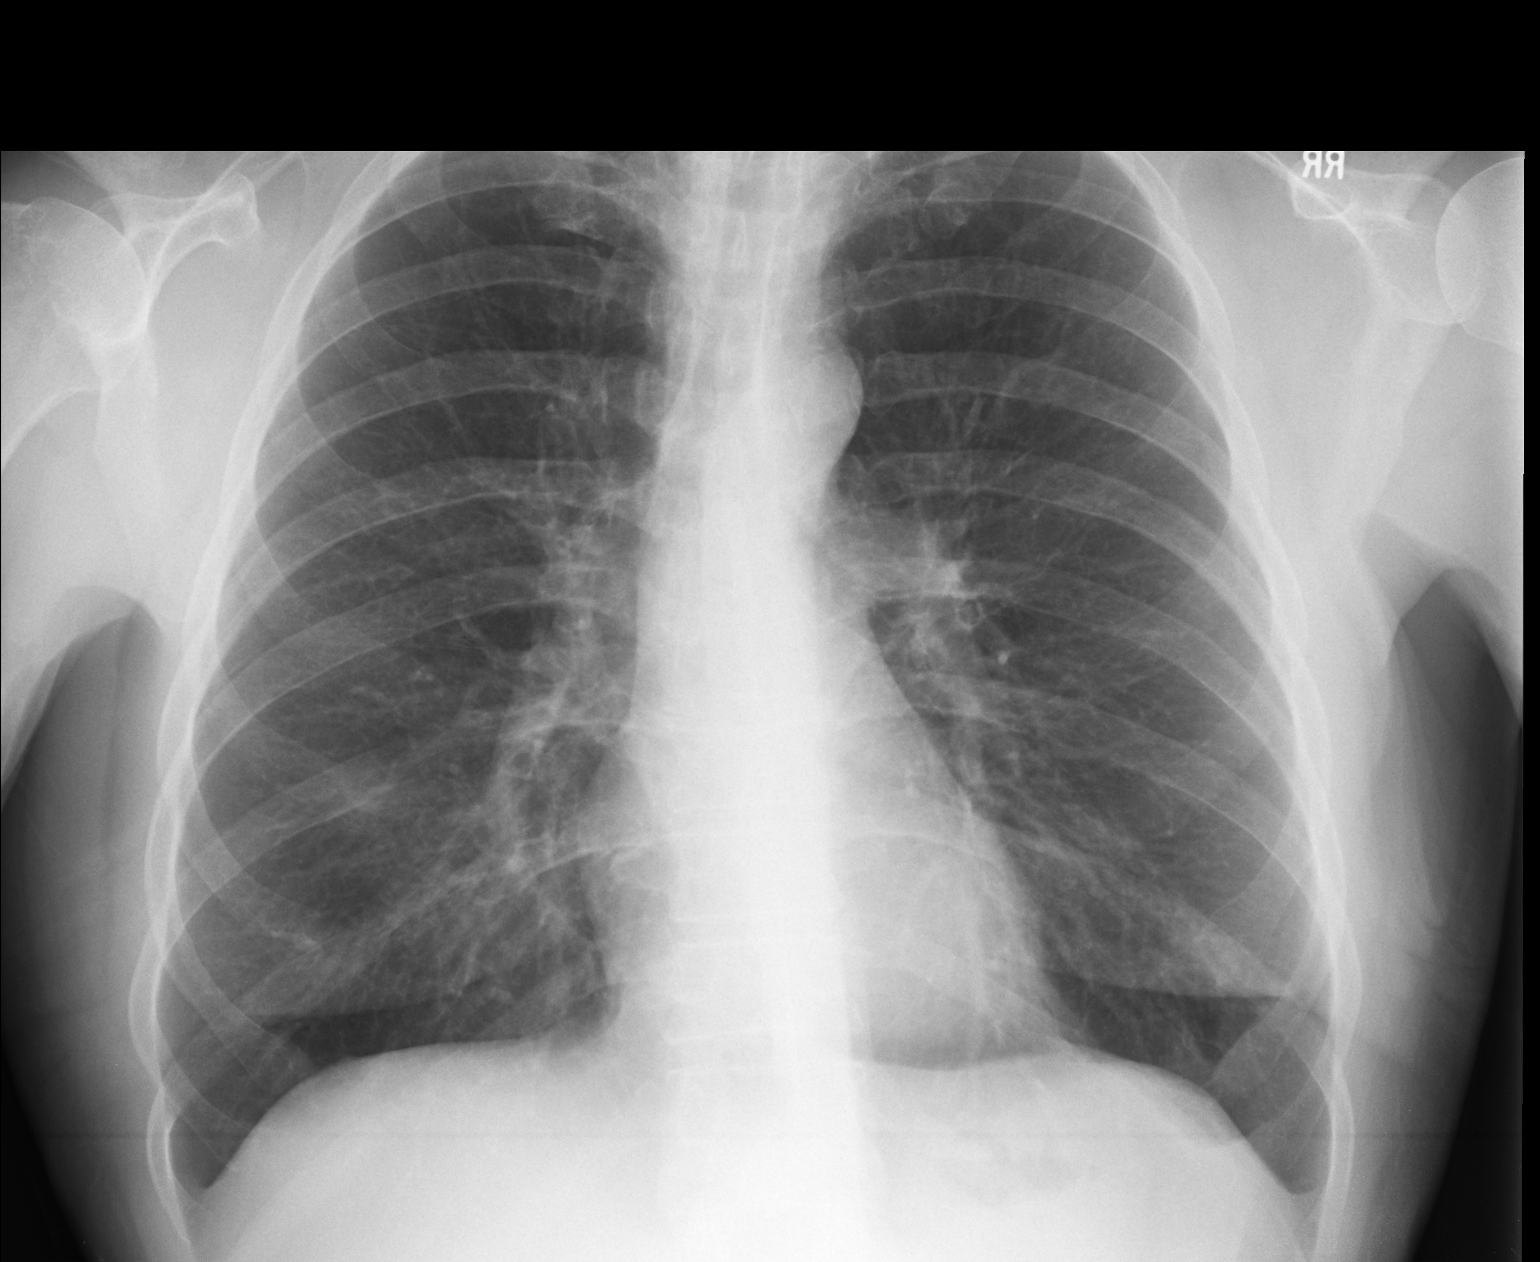

[lateral]
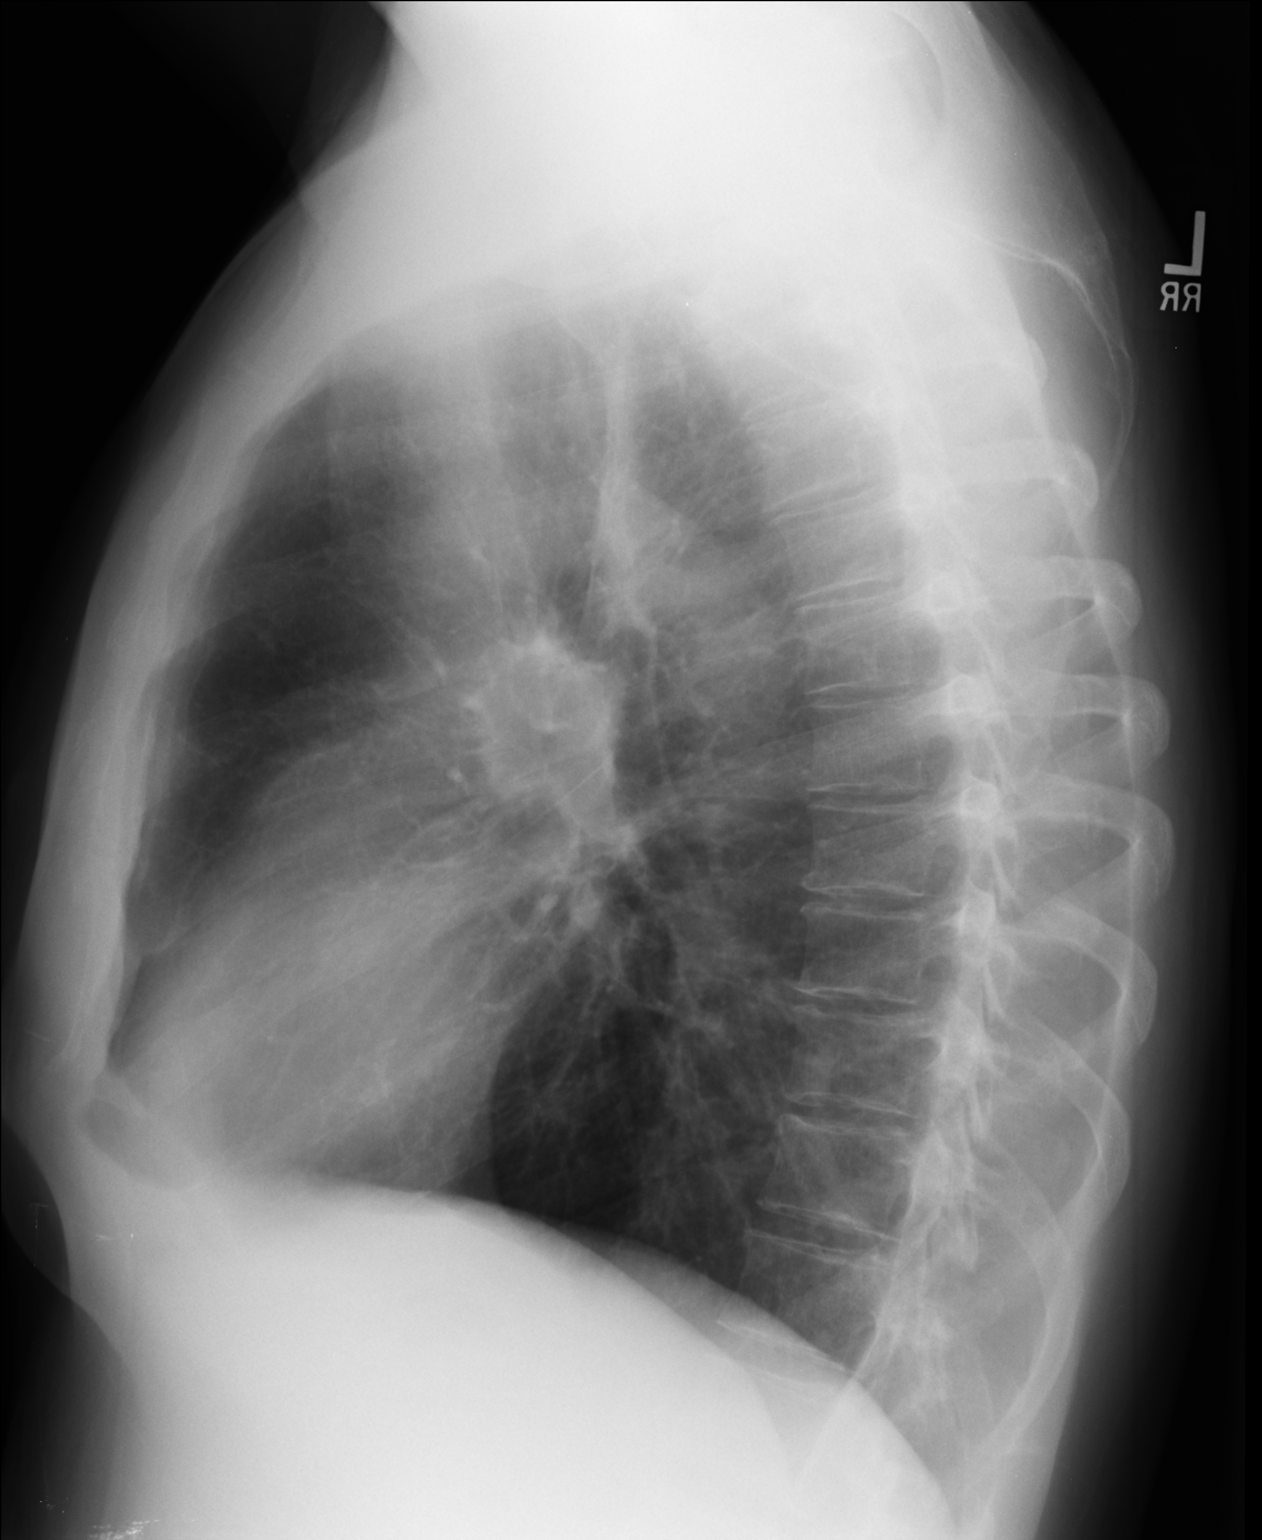

[2 of 2 positions shown; findings below may reference images not displayed]

FINDINGS: There is a small patchy area of infiltrate seen anteriorly overlying
the heart shadow on the lateral view. This is not appreciable on the
PA view. It could be in the lingula or in the middle lobe. There is
peribronchial thickening which is chronic or recurrent.

Heart size and pulmonary vascularity are normal. No effusions. No
osseous abnormality.
IMPRESSION: Focal faint infiltrate either in the lingula right middle lobe.
Peribronchial thickening consistent with bronchitis which could be
acute or chronic.

## 2015-10-31 ENCOUNTER — Telehealth: Payer: Self-pay | Admitting: Internal Medicine

## 2015-10-31 DIAGNOSIS — E291 Testicular hypofunction: Secondary | ICD-10-CM

## 2015-10-31 MED ORDER — TESTOSTERONE 12.5 MG/ACT (1%) TD GEL
1.0000 | Freq: Every day | TRANSDERMAL | Status: DC
Start: 2015-10-31 — End: 2015-11-14

## 2015-10-31 NOTE — Addendum Note (Signed)
Addended byDamita Dunnings D on: 10/31/2015 05:16 PM   Modules accepted: Orders

## 2015-10-31 NOTE — Telephone Encounter (Signed)
If his insurance covers other  testosterone is okay to switch to:  testosterone td gel 12.5 mg/act (1%) (generic for AndroGel Pump1%)  Tier 2 PA, QL (4 pump bottles/30 days): 1 pump daily Arrange a free-total  testosterone check in 2 months

## 2015-10-31 NOTE — Telephone Encounter (Signed)
°  Relationship to patient: Self  Can be reached: [phone]   Pharmacy:  [id number], [place] [phone] (Phone) [phone] (Fax)       Reason for call: Patient request to have his Androgel switched to 1.25 mg Generic form because the name brand is less expensive.

## 2015-10-31 NOTE — Telephone Encounter (Signed)
Androgel not available in generic yet. Will call Pt to inform.

## 2015-10-31 NOTE — Telephone Encounter (Signed)
Pt calling for cheaper Testosterone option. Per Pt's insurance formulary: Androgel is a Tier 3 drug, they do have several Tier 2 options. See below.   testosterone cypionate im inj in oil 100 mg/ml Tier 2 PA  testosterone cypionate im inj in oil 200 mg/ml Tier 2  PA  testosterone enanthate im inj in oil 200 mg/ml Tier 2  PA  testosterone td gel 25 mg/2.5gm (1%) (generic for AndroGel 1%) Tier 2 PA, QL (90 packets/30 days)  testosterone td gel 50 mg/5gm (1%) (generic for AndroGel 1%) Tier 2 PA, QL (60 packets/30 days)  testosterone td gel 12.5 mg/act (1%) (generic for AndroGel Pump1%)  Tier 2 PA, QL (4 pump bottles/30 days)  Please advise.

## 2015-10-31 NOTE — Telephone Encounter (Signed)
LMOM on work number to return call, mobile number not in service, and home phone no answer. Rx faxed to [facility]. Testosterone, Free, Total ordered to be completed in 2 months.

## 2015-11-01 NOTE — Telephone Encounter (Signed)
Spoke w/ Pt, informed him of below, and to recheck testosterone in 2 months. Pt originally had 6 month follow-up appt scheduled for 11/29/2015 which he was uninformed about, he requested appt be cancelled because he felt like he didn't need to be seen since he is feeling okay. He is in a work Hospital doctor in [place] this week and will call tomorrow or Monday to schedule lab appt to recheck Testosterone for 2 months.

## 2015-11-02 ENCOUNTER — Telehealth: Payer: Self-pay | Admitting: *Deleted

## 2015-11-02 NOTE — Telephone Encounter (Signed)
PA initiated on covermymeds.com Key: [id number] Awaiting determination

## 2015-11-05 NOTE — Telephone Encounter (Signed)
PA approved effective from 11/02/2015 through 04/20/2038

## 2015-11-09 NOTE — Telephone Encounter (Addendum)
Spoke w/ Pt, he started new dosage of generic testosterone yesterday (7/20) and used 1 pump as prescribed, he felt very fatigued throughout the day and pumped once more on 7/20. He immediately felt better and wants to inform PCP that he is going to use 2 pumps until testosterone is checked in 2 months. Informed him I would let PCP know when he returns to office on Wednesday.

## 2015-11-14 MED ORDER — TESTOSTERONE 12.5 MG/ACT (1%) TD GEL: 2.0000 | Freq: Every day | TRANSDERMAL | 1 refills | Status: DC

## 2015-11-14 NOTE — Telephone Encounter (Signed)
Okay to use 2 pumps, advise patient to check in 6 weeks from today rather than wait 2 months

## 2015-11-14 NOTE — Telephone Encounter (Signed)
LMOM at work number, instructing him to return call at his convenience. Testosterone Rx faxed to [facility].

## 2015-11-14 NOTE — Telephone Encounter (Signed)
Med list updated, and Rx printed, awaiting MD signature.

## 2015-11-14 NOTE — Addendum Note (Signed)
Addended byDamita Dunnings D on: 11/14/2015 03:44 PM   Modules accepted: Orders

## 2015-11-20 NOTE — Telephone Encounter (Signed)
Pt called in again requesting a call back from [place]. Pt says that he only wants clarity on taking Gel. Pt says that he's been awaiting a call back for a few days.  Please follow up with pt as he would like to be sure that he is taking Gel properly.     Thanks.    CB: [phone] or [phone]

## 2015-11-20 NOTE — Telephone Encounter (Signed)
Spoke w/ Pt, informed him to continue 2 pumps daily and recheck labs in 6 weeks. Lab appt scheduled for 12/13/2015 at 0730. Pt states he definitely doesn't feel great on generic Testosterone as he did w/ brand name Androgel. Informed him once labs come back later this month he can discuss w/ Dr. Larose Kells about going back on brand name if needed. Pt verbalized understanding.

## 2015-11-21 ENCOUNTER — Telehealth: Payer: Self-pay | Admitting: Internal Medicine

## 2015-11-21 NOTE — Telephone Encounter (Signed)
[facility] - Client [place] Patient Name: Ricardo Fisher DOB: 02/29/1956 Initial Comment Caller states he was on the generic drug, because of cost. His tongue is getting fissures. Muscle pain. Nurse Assessment Nurse: Dimas Chyle, RN, Dellis Filbert Date/Time Eilene Ghazi Time): 11/21/2015 9:49:30 AM Confirm and document reason for call. If symptomatic, describe symptoms. You must click the next button to save text entered. ---Caller states he was on the generic drug, because of cost. His tongue is getting fissures. Muscle pain. On Androgel 1% since 11/07/15. Symptoms started this week. Has the patient traveled out of the country within the last 30 days? ---No Does the patient have any new or worsening symptoms? ---Yes Will a triage be completed? ---Yes Related visit to physician within the last 2 weeks? ---Yes Does the PT have any chronic conditions? (i.e. diabetes, asthma, etc.) ---No Is this a behavioral health or substance abuse call? ---No Guidelines Guideline Title Affirmed Question Affirmed Notes Mouth Symptoms All other mouth symptoms (Exceptions: dry mouth from not drinking enough liquids, chapped lips) Muscle Aches and Body Pain [1] MODERATE pain (e.g., interferes with normal activities) AND [2] present > 3 days Final Disposition User See PCP When Office is Open (within 3 days) Dimas Chyle, RN, Dellis Filbert Disagree/Comply: Comply Disagree/Comply: Leta Baptist

## 2015-11-23 ENCOUNTER — Telehealth: Payer: Self-pay | Admitting: Internal Medicine

## 2015-11-23 NOTE — Telephone Encounter (Signed)
Relation to [id number] Call back number: [phone]   Reason for call:  Patient requesting MG increase for Testosterone 12.5 MG/ACT (1%) GEL , patient states he's experiencing hot flashes and would like to speak with the nurse. Patient states he feels he might run out prior to lab appointment. Please advise

## 2015-11-23 NOTE — Telephone Encounter (Signed)
Contacted patient advised patient when RN is free she will follow up again.

## 2015-11-23 NOTE — Telephone Encounter (Signed)
Called patient. He has used Androgel 1.62% for several years and recently switched to generic 1% testosterone gel due to cost (see previous notes). Since starting the 1% gel, pt has been having muscle aches, feeling anxious, and developed fissures on his tongue about 3 days after starting. He feels that the 1% gel works well initially and provides immediate relief when he applies it, but then after the morning it wears off and he 'can't hardly text or hold a pencil.' He would like to change back to Androgel 1.62% and has appt w/ PCP 11/28/15 to discuss. He isn't sure if he has enough of his current medication to hold him over until then, and would like PCP to send in Androgel 1.62% prior to appt if possible.   Pt was very appreciative of the help he has previously received from [facility] and Dr. Larose Kells regarding this. He does have enough to last until Monday, and is aware that he may not receive return call until then.

## 2015-11-23 NOTE — Telephone Encounter (Signed)
Returned call. "The wireless customer you are trying to reach is not available. Please try your call again later."

## 2015-11-26 MED ORDER — TESTOSTERONE 20.25 MG/ACT (1.62%) TD GEL: 2.0000 | Freq: Every day | TRANSDERMAL | 2 refills | Status: DC

## 2015-11-26 NOTE — Telephone Encounter (Signed)
Ok to go back to androgel 1.62%, same sig, send a 3 month supply, schedule a f/u for 2 months . If not going back to normal, call the office

## 2015-11-26 NOTE — Addendum Note (Signed)
Addended by: Dorrene German on: 11/26/2015 03:22 PM   Modules accepted: Orders

## 2015-11-26 NOTE — Telephone Encounter (Signed)
Medication faxed to pharmacy as requested, fax confirmation received. Patient notified of instructions and verbalized understanding. He was very appreciative of Dr. Ethel Rana assistance. Canceled pt's appt for this week and rescheduled for 2 month follow-up as directed.

## 2015-11-28 ENCOUNTER — Ambulatory Visit: Payer: BLUE CROSS/BLUE SHIELD | Admitting: Internal Medicine

## 2015-11-29 ENCOUNTER — Ambulatory Visit: Payer: BLUE CROSS/BLUE SHIELD | Admitting: Internal Medicine

## 2015-11-30 ENCOUNTER — Ambulatory Visit: Payer: BLUE CROSS/BLUE SHIELD | Admitting: Internal Medicine

## 2015-12-13 ENCOUNTER — Other Ambulatory Visit: Payer: BLUE CROSS/BLUE SHIELD

## 2016-01-04 ENCOUNTER — Other Ambulatory Visit: Payer: BLUE CROSS/BLUE SHIELD

## 2016-01-30 ENCOUNTER — Ambulatory Visit: Payer: BLUE CROSS/BLUE SHIELD | Admitting: Internal Medicine

## 2016-02-19 ENCOUNTER — Encounter: Payer: Self-pay | Admitting: Internal Medicine

## 2016-02-19 ENCOUNTER — Ambulatory Visit (INDEPENDENT_AMBULATORY_CARE_PROVIDER_SITE_OTHER): Payer: BLUE CROSS/BLUE SHIELD | Admitting: Internal Medicine

## 2016-02-19 VITALS — BP 118/76 | HR 74 | Temp 97.6°F | Resp 14 | Ht 71.0 in | Wt 169.5 lb

## 2016-02-19 DIAGNOSIS — Z23 Encounter for immunization: Secondary | ICD-10-CM | POA: Diagnosis not present

## 2016-02-19 DIAGNOSIS — E291 Testicular hypofunction: Secondary | ICD-10-CM | POA: Diagnosis not present

## 2016-02-19 DIAGNOSIS — I10 Essential (primary) hypertension: Secondary | ICD-10-CM

## 2016-02-19 NOTE — Progress Notes (Signed)
Pre visit review using our clinic review tool, if applicable. No additional management support is needed unless otherwise documented below in the visit note. 
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
## 2016-02-19 NOTE — Patient Instructions (Signed)
GO TO THE LAB : Get the blood work     GO TO THE FRONT DESK Schedule your next appointment for a  Physical exam by 05-2015, fasting

## 2016-02-21 NOTE — Assessment & Plan Note (Signed)
Hypogonadism: Currently on AndroGel 1.62%, two 3/4 puffs qd . He feels well. Plan: Refill meds, check labs. HTN: on BBs, controlled. Flu shot today RTC 05-2016, CPX

## 2016-02-24 LAB — TESTOS,TOTAL,FREE AND SHBG (FEMALE)
Sex Hormone Binding Glob.: 65 nmol/L (ref 22–77)
TESTOSTERONE,FREE: 45.6 pg/mL (ref 35.0–155.0)
Testosterone,Total,LC/MS/MS: 524 ng/dL (ref 250–1100)

## 2016-02-25 MED ORDER — TESTOSTERONE 20.25 MG/ACT (1.62%) TD GEL: 2.0000 | Freq: Every day | TRANSDERMAL | 2 refills | Status: DC

## 2016-02-25 NOTE — Addendum Note (Signed)
Addended byDamita Dunnings D on: 02/25/2016 02:52 PM   Modules accepted: Orders

## 2016-02-26 ENCOUNTER — Other Ambulatory Visit: Payer: Self-pay | Admitting: Internal Medicine

## 2016-04-08 ENCOUNTER — Ambulatory Visit (INDEPENDENT_AMBULATORY_CARE_PROVIDER_SITE_OTHER): Payer: BLUE CROSS/BLUE SHIELD | Admitting: Family Medicine

## 2016-04-08 ENCOUNTER — Encounter: Payer: Self-pay | Admitting: Family Medicine

## 2016-04-08 VITALS — BP 128/79 | HR 106 | Temp 97.8°F | Resp 20 | Wt 173.8 lb

## 2016-04-08 DIAGNOSIS — J0120 Acute ethmoidal sinusitis, unspecified: Secondary | ICD-10-CM

## 2016-04-08 MED ORDER — FLUTICASONE PROPIONATE 50 MCG/ACT NA SUSP: 2.0000 | Freq: Every day | NASAL | 6 refills | Status: DC

## 2016-04-08 MED ORDER — AMOXICILLIN-POT CLAVULANATE 875-125 MG PO TABS: 1.0000 | ORAL_TABLET | Freq: Two times a day (BID) | ORAL | 0 refills | Status: DC

## 2016-04-08 MED ORDER — FLUTICASONE PROPIONATE 50 MCG/ACT NA SUSP: 2.0000 | Freq: Every day | NASAL | 0 refills | Status: DC
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
## 2016-04-08 NOTE — Patient Instructions (Signed)
Start Augmentin every 12 hours for 10 days, and take until completed.   Rest, hydrate. Flonase. Mucinex DM.    Sinusitis, Adult Sinusitis is soreness and inflammation of your sinuses. Sinuses are hollow spaces in the bones around your face. They are located:  Around your eyes.  In the middle of your forehead.  Behind your nose.  In your cheekbones. Your sinuses and nasal passages are lined with a stringy fluid (mucus). Mucus normally drains out of your sinuses. When your nasal tissues get inflamed or swollen, the mucus can get trapped or blocked so air cannot flow through your sinuses. This lets bacteria, viruses, and funguses grow, and that leads to infection. Follow these instructions at home: Medicines  Take, use, or apply over-the-counter and prescription medicines only as told by your doctor. These may include nasal sprays.  If you were prescribed an antibiotic medicine, take it as told by your doctor. Do not stop taking the antibiotic even if you start to feel better. Hydrate and Humidify  Drink enough water to keep your pee (urine) clear or pale yellow.  Use a cool mist humidifier to keep the humidity level in your home above 50%.  Breathe in steam for 10-15 minutes, 3-4 times a day or as told by your doctor. You can do this in the bathroom while a hot shower is running.  Try not to spend time in cool or dry air. Rest  Rest as much as possible.  Sleep with your head raised (elevated).  Make sure to get enough sleep each night. General instructions  Put a warm, moist washcloth on your face 3-4 times a day or as told by your doctor. This will help with discomfort.  Wash your hands often with soap and water. If there is no soap and water, use hand sanitizer.  Do not smoke. Avoid being around people who are smoking (secondhand smoke).  Keep all follow-up visits as told by your doctor. This is important. Contact a doctor if:  You have a fever.  Your symptoms get  worse.  Your symptoms do not get better within 10 days. Get help right away if:  You have a very bad headache.  You cannot stop throwing up (vomiting).  You have pain or swelling around your face or eyes.  You have trouble seeing.  You feel confused.  Your neck is stiff.  You have trouble breathing. This information is not intended to replace advice given to you by your health care provider. Make sure you discuss any questions you have with your health care provider. Document Released: 09/24/2007 Document Revised: 12/02/2015 Document Reviewed: 01/31/2015 Elsevier Interactive Patient Education  2017 [employer].

## 2016-04-30 ENCOUNTER — Encounter: Payer: Self-pay | Admitting: Internal Medicine

## 2016-04-30 DIAGNOSIS — L718 Other rosacea: Secondary | ICD-10-CM | POA: Diagnosis not present

## 2016-04-30 DIAGNOSIS — D485 Neoplasm of uncertain behavior of skin: Secondary | ICD-10-CM | POA: Diagnosis not present

## 2016-04-30 DIAGNOSIS — L814 Other melanin hyperpigmentation: Secondary | ICD-10-CM | POA: Diagnosis not present

## 2016-04-30 DIAGNOSIS — L821 Other seborrheic keratosis: Secondary | ICD-10-CM | POA: Diagnosis not present

## 2016-04-30 DIAGNOSIS — L738 Other specified follicular disorders: Secondary | ICD-10-CM | POA: Diagnosis not present

## 2016-05-02 ENCOUNTER — Telehealth: Payer: Self-pay | Admitting: Family Medicine

## 2016-05-02 NOTE — Telephone Encounter (Signed)
[place] [facility] Client [facility] Patient Name: Ricardo Fisher DOB: 02-Feb-1956 Initial Comment Caller states has sinus pressure and ears are popping Nurse Assessment Nurse: Markus Daft, RN, [place] Date/Time (Eastern Time): 05/02/2016 1:30:14 PM Confirm and document reason for call. If symptomatic, describe symptoms. ---Caller states has sinus pressure and ears are popping. He has some nasal congestion. He is taking Flonase. S/S for 3 wks. before seeing MD. Finished antibiotics and was feeling great, and chest cleared up. S/S just resumed Monday. Does the patient have any new or worsening symptoms? ---Yes Will a triage be completed? ---Yes Related visit to physician within the last 2 weeks? ---Yes Does the PT have any chronic conditions? (i.e. diabetes, asthma, etc.) ---No Is this a behavioral health or substance abuse call? ---No Guidelines Guideline Title Affirmed Question Affirmed Notes Sinus Pain or Congestion [1] Sinus congestion as part of a cold AND [2] present < 10 days (all triage questions negative) Final Disposition User [place], RN, [place] Disagree/Comply: Comply

## 2016-05-20 ENCOUNTER — Other Ambulatory Visit: Payer: Self-pay | Admitting: Internal Medicine

## 2016-05-20 ENCOUNTER — Telehealth: Payer: Self-pay | Admitting: Internal Medicine

## 2016-05-20 NOTE — Telephone Encounter (Signed)
Rx printed, awaiting MD signature.  

## 2016-05-20 NOTE — Telephone Encounter (Signed)
Patient called requesting a refill of Testosterone (ANDROGEL PUMP) 20.25 MG/ACT (1.62%) GEL And metoprolol succinate (TOPROL-XL) 25 MG 24 hr tablet Please advise  Pharmacy: [place], [place]

## 2016-05-20 NOTE — Telephone Encounter (Signed)
Electronic refill request received.

## 2016-05-20 NOTE — Telephone Encounter (Signed)
Ok for 3 months 

## 2016-05-20 NOTE — Telephone Encounter (Signed)
Pt is requesting refill on Androgel.  Last OV: 02/19/2016 Last Fill: 02/25/2016 #75g and 2RF  Okay to refill?

## 2016-05-20 NOTE — Telephone Encounter (Signed)
Rx faxed to [facility] pharmacy.  

## 2016-05-26 ENCOUNTER — Other Ambulatory Visit: Payer: Self-pay | Admitting: *Deleted

## 2016-05-27 ENCOUNTER — Ambulatory Visit: Payer: BLUE CROSS/BLUE SHIELD

## 2016-05-27 ENCOUNTER — Encounter: Payer: BLUE CROSS/BLUE SHIELD | Admitting: Podiatry

## 2016-05-27 DIAGNOSIS — M79671 Pain in right foot: Secondary | ICD-10-CM

## 2016-05-27 DIAGNOSIS — M79672 Pain in left foot: Principal | ICD-10-CM

## 2016-05-27 NOTE — Progress Notes (Signed)
This encounter was created in error - please disregard.

## 2016-05-30 ENCOUNTER — Ambulatory Visit: Payer: BLUE CROSS/BLUE SHIELD | Admitting: Podiatry

## 2016-06-03 ENCOUNTER — Ambulatory Visit (INDEPENDENT_AMBULATORY_CARE_PROVIDER_SITE_OTHER): Payer: BLUE CROSS/BLUE SHIELD | Admitting: Podiatry

## 2016-06-03 ENCOUNTER — Ambulatory Visit (INDEPENDENT_AMBULATORY_CARE_PROVIDER_SITE_OTHER): Payer: BLUE CROSS/BLUE SHIELD

## 2016-06-03 ENCOUNTER — Encounter: Payer: Self-pay | Admitting: Podiatry

## 2016-06-03 VITALS — BP 146/82 | HR 62 | Resp 16

## 2016-06-03 DIAGNOSIS — M79671 Pain in right foot: Secondary | ICD-10-CM | POA: Diagnosis not present

## 2016-06-03 DIAGNOSIS — M79672 Pain in left foot: Secondary | ICD-10-CM | POA: Diagnosis not present

## 2016-06-03 DIAGNOSIS — M722 Plantar fascial fibromatosis: Secondary | ICD-10-CM | POA: Diagnosis not present

## 2016-06-03 MED ORDER — METHYLPREDNISOLONE 4 MG PO TBPK: ORAL_TABLET | ORAL | 0 refills | Status: DC

## 2016-06-03 MED ORDER — MELOXICAM 15 MG PO TABS: 15.0000 mg | ORAL_TABLET | Freq: Every day | ORAL | 3 refills | Status: DC

## 2016-06-03 NOTE — Progress Notes (Signed)
He presents today with chief complaint of bilateral plantar heel pain and forefoot pain times the past several months. His tried over-the-counter inserts to no avail. Continues anti-inflammatories to no avail.  Objective: Vital signs her stable he is alert and oriented 3 no distress. Pulses are strongly palpable neurologic sensorium is intact. Deep tendon reflexes are intact. Muscle strength is 5 over 5 dorsiflexion and plantar flexors and inverters and everters all intrinsic musculature is intact. Orthopedic evaluation to restrict all joints distal to ankle range of motion without crepitation. Cutaneous evaluation of a straight supple well-hydrated cutis. There is pain on palpation medial calcaneal tubercles bilateral. He does demonstrate osseously mature individual soft tissue increase in density of plantar fascia calcaneal insertion site.  Assessment: Plantar fasciitis bilateral.  Plan: Started on a Medrol Dosepak to be followed by meloxicam. Injected the bilateral heels today placed in plantar fascia braces bilateral and single night splint. Discussed appropriate shoe gear stretching exercises ice therapy and shoe gear modifications. We discussed the etiology pathology conservative versus surgical treatments. Follow up with him in 1 month.

## 2016-06-26 ENCOUNTER — Ambulatory Visit (INDEPENDENT_AMBULATORY_CARE_PROVIDER_SITE_OTHER): Payer: BLUE CROSS/BLUE SHIELD | Admitting: Internal Medicine

## 2016-06-26 ENCOUNTER — Encounter: Payer: Self-pay | Admitting: Internal Medicine

## 2016-06-26 VITALS — BP 124/72 | HR 77 | Temp 97.8°F | Resp 14 | Ht 71.0 in | Wt 171.2 lb

## 2016-06-26 DIAGNOSIS — E291 Testicular hypofunction: Secondary | ICD-10-CM

## 2016-06-26 DIAGNOSIS — Z0000 Encounter for general adult medical examination without abnormal findings: Secondary | ICD-10-CM

## 2016-06-26 DIAGNOSIS — Z23 Encounter for immunization: Secondary | ICD-10-CM

## 2016-06-26 LAB — LIPID PANEL
CHOL/HDL RATIO: 5
Cholesterol: 197 mg/dL (ref 0–200)
HDL: 42.7 mg/dL (ref >39.00)
LDL Cholesterol: 132 mg/dL — ABNORMAL HIGH (ref 0–99)
NonHDL: 153.87
TRIGLYCERIDES: 110 mg/dL (ref 0.0–149.0)
VLDL: 22 mg/dL (ref 0.0–40.0)

## 2016-06-26 LAB — CBC WITH DIFFERENTIAL/PLATELET
BASOS ABS: 0 10*3/uL (ref 0.0–0.1)
Basophils Relative: 0.4 % (ref 0.0–3.0)
EOS ABS: 0.2 10*3/uL (ref 0.0–0.7)
Eosinophils Relative: 2 % (ref 0.0–5.0)
HEMATOCRIT: 44.8 % (ref 39.0–52.0)
Hemoglobin: 15.3 g/dL (ref 13.0–17.0)
LYMPHS PCT: 21.3 % (ref 12.0–46.0)
Lymphs Abs: 2 10*3/uL (ref 0.7–4.0)
MCHC: 34.2 g/dL (ref 30.0–36.0)
MCV: 94.9 fl (ref 78.0–100.0)
Monocytes Absolute: 0.6 10*3/uL (ref 0.1–1.0)
Monocytes Relative: 6.3 % (ref 3.0–12.0)
NEUTROS PCT: 70 % (ref 43.0–77.0)
Neutro Abs: 6.6 10*3/uL (ref 1.4–7.7)
PLATELETS: 170 10*3/uL (ref 150.0–400.0)
RBC: 4.72 Mil/uL (ref 4.22–5.81)
RDW: 13.3 % (ref 11.5–15.5)
WBC: 9.5 10*3/uL (ref 4.0–10.5)

## 2016-06-26 LAB — COMPREHENSIVE METABOLIC PANEL
ALBUMIN: 4.3 g/dL (ref 3.5–5.2)
ALK PHOS: 50 U/L (ref 39–117)
ALT: 18 U/L (ref 0–53)
AST: 24 U/L (ref 0–37)
BILIRUBIN TOTAL: 0.4 mg/dL (ref 0.2–1.2)
BUN: 22 mg/dL (ref 6–23)
CALCIUM: 9.4 mg/dL (ref 8.4–10.5)
CO2: 28 meq/L (ref 19–32)
CREATININE: 1.03 mg/dL (ref 0.40–1.50)
Chloride: 106 mEq/L (ref 96–112)
GFR: 78.11 mL/min (ref >60.00)
Glucose, Bld: 103 mg/dL — ABNORMAL HIGH (ref 70–99)
Potassium: 4.6 mEq/L (ref 3.5–5.1)
Sodium: 138 mEq/L (ref 135–145)
TOTAL PROTEIN: 7.2 g/dL (ref 6.0–8.3)

## 2016-06-26 LAB — PSA: PSA: 0.11 ng/mL (ref 0.10–4.00)

## 2016-06-26 NOTE — Assessment & Plan Note (Addendum)
Td  2010; pnm 23 01-2015; prevnar today; had a flu shot   ---CCS:  2006  Cscope Dr Vladimir Faster Normal , reports had another colonoscopy in 2009 with Dr. Paulita Fujita, unable to get records, reportedly normal ---Prostate ca screening: on HRT, DRE normal, check a PSA --Lifestyle is very good except for the last few weeks, has been unable to exercise as much.  ---Tobacco: Counseled n Labs: CMP, FLP, CBC, PSA, testosterone

## 2016-06-26 NOTE — Assessment & Plan Note (Signed)
HTN: Continue beta blockers. Hyperlipidemia: Diet control, checking labs Low testosterone-- Good compliance with testosterone, checking labs Plantar fasciitis: Under the care of podiatry, taking meloxicam daily, checking labs, GI precautions discussed. Wonders if okay to take Tylenol, okay 1 dose daily. See instructions. Encourage stretching. Call if not improving soon. RTC 6 months

## 2016-06-26 NOTE — Patient Instructions (Signed)
GO TO THE LAB : Get the blood work     GO TO THE FRONT DESK Schedule your next appointment for a  checkup in 6 months  Okay to take meloxicam until you needed. Always take it with food because may cause gastritis and ulcers.  If you notice nausea, stomach pain, change in the color of stools --->  Stop the medicine and let us know  Okay to take Tylenol once daily as needed, no more than 1000 mg daily.  Stretching 2 or 3 times a day will help with plantar fasciitis     Steps to Quit Smoking Smoking tobacco can be harmful to your health and can affect almost every organ in your body. Smoking puts you, and those around you, at risk for developing many serious chronic diseases. Quitting smoking is difficult, but it is one of the best things that you can do for your health. It is never too late to quit. What are the benefits of quitting smoking? When you quit smoking, you lower your risk of developing serious diseases and conditions, such as:  Lung cancer or lung disease, such as COPD.  Heart disease.  Stroke.  Heart attack.  Infertility.  Osteoporosis and bone fractures. Additionally, symptoms such as coughing, wheezing, and shortness of breath may get better when you quit. You may also find that you get sick less often because your body is stronger at fighting off colds and infections. If you are pregnant, quitting smoking can help to reduce your chances of having a baby of low birth weight. How do I get ready to quit? When you decide to quit smoking, create a plan to make sure that you are successful. Before you quit:  Pick a date to quit. Set a date within the next two weeks to give you time to prepare.  Write down the reasons why you are quitting. Keep this list in places where you will see it often, such as on your bathroom mirror or in your car or wallet.  Identify the people, places, things, and activities that make you want to smoke (triggers) and avoid them. Make sure to  take these actions:  Throw away all cigarettes at home, at work, and in your car.  Throw away smoking accessories, such as Scientist, research (medical).  Clean your car and make sure to empty the ashtray.  Clean your home, including curtains and carpets.  Tell your family, friends, and coworkers that you are quitting. Support from your loved ones can make quitting easier.  Talk with your health care provider about your options for quitting smoking.  Find out what treatment options are covered by your health insurance. What strategies can I use to quit smoking? Talk with your healthcare provider about different strategies to quit smoking. Some strategies include:  Quitting smoking altogether instead of gradually lessening how much you smoke over a period of time. Research shows that quitting "cold [place]" is more successful than gradually quitting.  Attending in-person counseling to help you build problem-solving skills. You are more likely to have success in quitting if you attend several counseling sessions. Even short sessions of 10 minutes can be effective.  Finding resources and support systems that can help you to quit smoking and remain smoke-free after you quit. These resources are most helpful when you use them often. They can include:  Online chats with a Social worker.  Telephone quitlines.  Printed Furniture conservator/restorer.  Support groups or group counseling.  Text messaging programs.  Mobile  phone applications.  Taking medicines to help you quit smoking. (If you are pregnant or breastfeeding, talk with your health care provider first.) Some medicines contain nicotine and some do not. Both types of medicines help with cravings, but the medicines that include nicotine help to relieve withdrawal symptoms. Your health care provider may recommend:  Nicotine patches, gum, or lozenges.  Nicotine inhalers or sprays.  Non-nicotine medicine that is taken by mouth. Talk with your health  care provider about combining strategies, such as taking medicines while you are also receiving in-person counseling. Using these two strategies together makes you more likely to succeed in quitting than if you used either strategy on its own. If you are pregnant or breastfeeding, talk with your health care provider about finding counseling or other support strategies to quit smoking. Do not take medicine to help you quit smoking unless told to do so by your health care provider. What things can I do to make it easier to quit? Quitting smoking might feel overwhelming at first, but there is a lot that you can do to make it easier. Take these important actions:  Reach out to your family and friends and ask that they support and encourage you during this time. Call telephone quitlines, reach out to support groups, or work with a counselor for support.  Ask people who smoke to avoid smoking around you.  Avoid places that trigger you to smoke, such as bars, parties, or smoke-break areas at work.  Spend time around people who do not smoke.  Lessen stress in your life, because stress can be a smoking trigger for some people. To lessen stress, try:  Exercising regularly.  Deep-breathing exercises.  Yoga.  Meditating.  Performing a body scan. This involves closing your eyes, scanning your body from head to toe, and noticing which parts of your body are particularly tense. Purposefully relax the muscles in those areas.  Download or purchase mobile phone or tablet apps (applications) that can help you stick to your quit plan by providing reminders, tips, and encouragement. There are many free apps, such as QuitGuide from the [employer] Office manager for Disease Control and Prevention). You can find other support for quitting smoking (smoking cessation) through smokefree.gov and other websites. How will I feel when I quit smoking? Within the first 24 hours of quitting smoking, you may start to feel some  withdrawal symptoms. These symptoms are usually most noticeable 2-3 days after quitting, but they usually do not last beyond 2-3 weeks. Changes or symptoms that you might experience include:  Mood swings.  Restlessness, anxiety, or irritation.  Difficulty concentrating.  Dizziness.  Strong cravings for sugary foods in addition to nicotine.  Mild weight gain.  Constipation.  Nausea.  Coughing or a sore throat.  Changes in how your medicines work in your body.  A depressed mood.  Difficulty sleeping (insomnia). After the first 2-3 weeks of quitting, you may start to notice more positive results, such as:  Improved sense of smell and taste.  Decreased coughing and sore throat.  Slower heart rate.  Lower blood pressure.  Clearer skin.  The ability to breathe more easily.  Fewer sick days. Quitting smoking is very challenging for most people. Do not get discouraged if you are not successful the first time. Some people need to make many attempts to quit before they achieve long-term success. Do your best to stick to your quit plan, and talk with your health care provider if you have any questions or concerns.  This information is not intended to replace advice given to you by your health care provider. Make sure you discuss any questions you have with your health care provider. Document Released: 04/01/2001 Document Revised: 12/04/2015 Document Reviewed: 08/22/2014 Elsevier Interactive Patient Education  2017 [employer].
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
## 2016-06-26 NOTE — Progress Notes (Signed)
Pre visit review using our clinic review tool, if applicable. No additional management support is needed unless otherwise documented below in the visit note. 

## 2016-06-29 LAB — TESTOS,TOTAL,FREE AND SHBG (FEMALE)
SEX HORMONE BINDING GLOB.: 64 nmol/L (ref 22–77)
TESTOSTERONE,FREE: 104.9 pg/mL (ref 35.0–155.0)
Testosterone,Total,LC/MS/MS: 1140 ng/dL — ABNORMAL HIGH (ref 250–1100)

## 2016-07-01 ENCOUNTER — Ambulatory Visit: Payer: BLUE CROSS/BLUE SHIELD | Admitting: Podiatry

## 2016-07-15 ENCOUNTER — Ambulatory Visit: Payer: BLUE CROSS/BLUE SHIELD | Admitting: Podiatry

## 2016-08-13 ENCOUNTER — Other Ambulatory Visit: Payer: Self-pay | Admitting: Internal Medicine

## 2016-08-13 NOTE — Telephone Encounter (Signed)
Rx printed, awaiting MD signature.  

## 2016-08-13 NOTE — Telephone Encounter (Signed)
Rx faxed to [facility] pharmacy.  

## 2016-08-13 NOTE — Telephone Encounter (Signed)
Pt is requesting refill on Androgel.  Last OV: 06/26/2016 Last Fill: 05/20/2016 #75g and 2RF  Okay to refill?

## 2016-08-13 NOTE — Telephone Encounter (Signed)
Okay to refill 6 months 

## 2016-08-22 ENCOUNTER — Other Ambulatory Visit: Payer: Self-pay | Admitting: Internal Medicine

## 2016-08-22 ENCOUNTER — Telehealth: Payer: Self-pay | Admitting: Internal Medicine

## 2016-08-22 DIAGNOSIS — E291 Testicular hypofunction: Secondary | ICD-10-CM

## 2016-08-22 NOTE — Telephone Encounter (Signed)
Spoke w/ [facility] pharmacy- Pt is having to pay $300 out of pocket for Androgel Pt is currently on. They are requesting to change to testosterone 12.5mg /1.25g 4 squirts onto skin daily to equal what he is currently on which should be cheaper for Pt. Pt is aware and agrees to switch doses if PCP agrees.

## 2016-08-22 NOTE — Telephone Encounter (Signed)
Pharmacy:  [place], [address] [phone] (Phone) [phone] (Fax)     Reason for call: Pharmacy request call back about Testosterone (ANDROGEL PUMP) 20.25 MG/ACT (1.62%) GEL [[id number]]

## 2016-08-25 MED ORDER — TESTOSTERONE 12.5 MG/ACT (1%) TD GEL: 4.0000 | Freq: Every day | TRANSDERMAL | 0 refills | Status: DC

## 2016-08-25 NOTE — Telephone Encounter (Signed)
Rx faxed to [facility] pharmacy.  

## 2016-08-25 NOTE — Telephone Encounter (Signed)
Okay to make the change, send three-month supply, schedule a lab only visit 2 months from today to check levels

## 2016-08-26 NOTE — Telephone Encounter (Signed)
LMOM informing Pt of med change and to call office to schedule lab appt to be completed in 2 months. Testosterone ordered.

## 2016-09-16 ENCOUNTER — Ambulatory Visit (INDEPENDENT_AMBULATORY_CARE_PROVIDER_SITE_OTHER): Payer: BLUE CROSS/BLUE SHIELD | Admitting: Sports Medicine

## 2016-09-16 ENCOUNTER — Encounter: Payer: Self-pay | Admitting: Sports Medicine

## 2016-09-16 ENCOUNTER — Ambulatory Visit: Payer: Self-pay

## 2016-09-16 VITALS — BP 124/82 | HR 66 | Ht 71.0 in | Wt 172.6 lb

## 2016-09-16 DIAGNOSIS — M79671 Pain in right foot: Secondary | ICD-10-CM | POA: Diagnosis not present

## 2016-09-16 DIAGNOSIS — S96919A Strain of unspecified muscle and tendon at ankle and foot level, unspecified foot, initial encounter: Secondary | ICD-10-CM

## 2016-09-16 DIAGNOSIS — X503XXA Overexertion from repetitive movements, initial encounter: Secondary | ICD-10-CM

## 2016-09-16 DIAGNOSIS — E65 Localized adiposity: Secondary | ICD-10-CM

## 2016-09-16 DIAGNOSIS — M79672 Pain in left foot: Secondary | ICD-10-CM

## 2016-09-16 NOTE — Patient Instructions (Addendum)
Please perform the exercise program that Jeneen Rinks has prepared for you and gone over in detail on a daily basis.  In addition to the handout you were provided you can access your program through: [email or web address]   Your unique program code is: [email or web address]
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
## 2016-09-20 NOTE — Assessment & Plan Note (Addendum)
Long arch strain that is chronic.  Patient would benefit from longitudinal arch support as well as arch compression.   Patient has tried other over-the-counter arch support has failed these and is interested in custom cushioned orthotics.  These were fabricated for him as below.  Plantar fascia protocol with Alfredson exercises and plantar fascial stretching/longitudinal arch stretching reviewed in detail.   Given the contusion would avoid any type of direct pressure or massage other than gentle frozen water bottle.  +++++++++++++++++++++++++++++++++++++++++++++++++++++++++++++++ PROCEDURE NOTE: THERAPEUTIC EXERCISES (97110) 15 minutes spent for Therapeutic exercises as stated in above notes.  This included exercises focusing on stretching, strengthening, with significant focus on eccentric aspects.   Proper technique shown and discussed handout in great detail with ATC.  All questions were discussed and answered.   PROCEDURE: CUSTOM ORTHOTIC FABRICATION Patient's underlying musculoskeletal conditions are directly related to poor biomechanics and will benefit from a functional custom orthotic.  There are no significant foot deformities that complicate the use of a custom orthotic.  The patient was fitted for a standard, cushioned, semi-rigid orthotic. The orthotic was heated & placed on the orthotic stand. The patient was positioned in subtalar neutral position and 10 of ankle dorsiflexion and weight bearing stance some heated orthotic blank. After completion of the molding a stable paste was applied to the orthotic blank. The orthotic was ground to a stable position for weightbearing. The patient ambulated in these and reported they were comfortable without pressure spots.              BLANK:  Standard Cushioned                 BASE:  Blue EVA      POSTINGS:  none

## 2016-09-20 NOTE — Assessment & Plan Note (Signed)
Patient does have a split in his fat pad on the right directly adjacent to the plantar fascia.  Given the fact the plantar fascia are normal in measurement but the longitudinal arch are painful this is not consistent with plantar fasciitis at this time and would not recommend any further injections.

## 2016-09-20 NOTE — Assessment & Plan Note (Signed)
See above

## 2016-10-31 ENCOUNTER — Telehealth: Payer: Self-pay | Admitting: Internal Medicine

## 2016-10-31 NOTE — Telephone Encounter (Signed)
Due for a total and free testosterone. Please arrange

## 2016-11-03 NOTE — Telephone Encounter (Signed)
Letter printed and mailed to Pt.  

## 2016-11-07 ENCOUNTER — Ambulatory Visit: Payer: BLUE CROSS/BLUE SHIELD | Admitting: Sports Medicine

## 2016-12-25 ENCOUNTER — Encounter: Payer: Self-pay | Admitting: Internal Medicine

## 2016-12-25 ENCOUNTER — Ambulatory Visit (INDEPENDENT_AMBULATORY_CARE_PROVIDER_SITE_OTHER): Payer: BLUE CROSS/BLUE SHIELD | Admitting: Internal Medicine

## 2016-12-25 VITALS — BP 124/76 | HR 67 | Temp 98.2°F | Resp 14 | Ht 71.0 in | Wt 174.5 lb

## 2016-12-25 DIAGNOSIS — E291 Testicular hypofunction: Secondary | ICD-10-CM | POA: Diagnosis not present

## 2016-12-25 DIAGNOSIS — R739 Hyperglycemia, unspecified: Secondary | ICD-10-CM | POA: Diagnosis not present

## 2016-12-25 DIAGNOSIS — I10 Essential (primary) hypertension: Secondary | ICD-10-CM | POA: Diagnosis not present

## 2016-12-25 LAB — HEMOGLOBIN A1C: Hgb A1c MFr Bld: 5.7 % (ref 4.6–6.5)

## 2016-12-25 NOTE — Patient Instructions (Signed)
GO TO THE LAB : Get the blood work     GO TO THE FRONT DESK Schedule your next appointment for a  physical exam by March 2019  Don't forget to get a flu shot this fall
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
## 2016-12-25 NOTE — Progress Notes (Signed)
Pre visit review using our clinic review tool, if applicable. No additional management support is needed unless otherwise documented below in the visit note. 

## 2016-12-26 NOTE — Assessment & Plan Note (Signed)
HTN: Seems well-controlled on metoprolol Hypogonadism: Good compliance with AndroGel, 2 pumps daily. Check testosterone levels Mild hyperglycemia: Check A1c Mild hoarseness noted today, chronic issue per patient. Never been formally eval by ENT. Rosacea: Has not been using Cleocin. Recommend to use as needed. RTC 06-2017, CPX

## 2016-12-31 LAB — TESTOS,TOTAL,FREE AND SHBG (FEMALE)
FREE TESTOSTERONE: 47.3 pg/mL (ref 35.0–155.0)
SEX HORMONE BINDING: 66 nmol/L (ref 22–77)
TESTOSTERONE, TOTAL, LC-MS-MS: 728 ng/dL (ref 250–1100)

## 2017-02-24 ENCOUNTER — Other Ambulatory Visit: Payer: Self-pay | Admitting: Internal Medicine

## 2017-02-24 NOTE — Telephone Encounter (Signed)
Rx faxed to [facility] pharmacy.  

## 2017-02-24 NOTE — Telephone Encounter (Signed)
Rx printed, awaiting MD signature.  

## 2017-02-24 NOTE — Telephone Encounter (Signed)
Pt is requesting refill on Androgel 1.62% gel pump.  Last OV: 12/25/2016 Last Fill: 08/21/2016 #75g and 5RF  Okay to refill?

## 2017-02-24 NOTE — Telephone Encounter (Signed)
Okay refill x 6 months

## 2017-04-29 ENCOUNTER — Telehealth: Payer: Self-pay | Admitting: *Deleted

## 2017-04-29 ENCOUNTER — Telehealth: Payer: Self-pay | Admitting: Internal Medicine

## 2017-04-29 NOTE — Telephone Encounter (Signed)
CRM for notification. See Telephone encounter for:   04/29/17.  Caller name: Juliann Pulse Relation to pt: [facility]  Call back number: provider line [phone] / Prior Review [phone]  Pharmacy: [place], [address] [phone] (Phone) [phone] (Fax)    Reason for call:  As per [employer]BS  Testosterone 20.25 MG/ACT (1.62%) GEL needs Prior Auth (as per insurance pharmacy sent in [place] request yesterday 04/28/17).   As per [facility] requesting non formulary request form for  (ANDROGEL PUMP) so this Rx can be placed on patient drug list at the pharmacy,  please fax form to [facility] attention (health services department) fax  # [phone] phone [phone].    *requesting samples due to patient being completely out, please advise

## 2017-04-29 NOTE — Telephone Encounter (Signed)
Spoke w/ [employer]- they needed to verify times on testosterone labs.

## 2017-04-29 NOTE — Telephone Encounter (Signed)
Received request for Medical Records from [place]; forwarded to [place] for email/scan/SLS 01/09

## 2017-04-29 NOTE — Telephone Encounter (Signed)
Ricardo Fisher w/[employer] of [place]  [phone] Option 3, Option 1, wanted the provider to know that if he wanted to call to give additional information to have the patient's medication approved or if he wants clarification that would be fine.  She also wanted the provider to know that the patient is a little concerned about this whole situation.

## 2017-04-29 NOTE — Telephone Encounter (Signed)
PA denied because medication does not meet medical necessity. PA denied because testosterone levels were not checked before 10 AM (all results in chart are in afternoon). They need 2 abnormal testosterone results of those 2 results sent- only 1 testosterone result is abnormal/ out of range. Once Pt meets these criteria Pt's formulary alternatives are: testosterone 1.62 % gel, testosterone solution (generic Axiron), testosterone cypionate injections, etc.

## 2017-04-29 NOTE — Telephone Encounter (Signed)
PA denied. Waiting for denial letter.

## 2017-04-29 NOTE — Telephone Encounter (Signed)
PA initiated via Covermymeds; KEY: [id number]. Awaiting determination.    **We do not have samples of this medication for Pt.

## 2017-04-30 MED ORDER — TESTOSTERONE 20.25 MG/ACT (1.62%) TD GEL: 2.0000 | Freq: Every day | TRANSDERMAL | 5 refills | Status: DC

## 2017-04-30 NOTE — Telephone Encounter (Signed)
Spoke with 2 staff members, the last one was Apolonio Schneiders a  Software engineer. The issues are:  They would cover a generic prescription and also they needed labs drawn  early in the morning showing a low testosterone. I explained her, pt was dx in 2011, he has been on HRT since; at this point is irrelevant to get a morning sample or show low testosterone levels because he is taking testosterone. We agreed on 1.  Send a new prescription for testosterone gel 1.62% specifically mentioning generic 2.  Faxed all previous testosterone levels to them.  [phone] Please notify patient of above Time spent more than 35 minutes

## 2017-04-30 NOTE — Telephone Encounter (Signed)
Generic Rx sent to [facility] and all testosterone results in chart faxed to [facility] at [phone].

## 2017-04-30 NOTE — Telephone Encounter (Signed)
Copied from [place] #[id number]. Topic: Quick Communication - See Telephone Encounter >> Apr 30, 2017  8:34 AM Ether Griffins B wrote: CRM for notification. See Telephone encounter for:  Pts AndroGel was denied due to the collection time. It needs to be before 10:00. Pt is out of medication. Bcbs wants Dr. Larose Kells to call in. Pt states it needs to be peer to peer so it can be done the same day. Pt is very sorry for any trouble this is causing.  04/30/17.

## 2017-05-01 NOTE — Telephone Encounter (Signed)
[facility] informed of PA approval.

## 2017-05-01 NOTE — Telephone Encounter (Signed)
PA approved effective from 04/30/2017 through 04/20/2038.

## 2017-05-01 NOTE — Telephone Encounter (Signed)
Again, PA was needed for generic Androderm gel. PA initiated yesterday via Covermymeds; KEY: [id number]. Awaiting determination.

## 2017-05-05 ENCOUNTER — Ambulatory Visit: Payer: BLUE CROSS/BLUE SHIELD | Admitting: Internal Medicine

## 2017-05-26 ENCOUNTER — Ambulatory Visit: Payer: Self-pay | Admitting: Internal Medicine

## 2017-05-29 ENCOUNTER — Other Ambulatory Visit: Payer: Self-pay | Admitting: Internal Medicine

## 2017-06-02 ENCOUNTER — Other Ambulatory Visit: Payer: Self-pay

## 2017-06-02 DIAGNOSIS — E291 Testicular hypofunction: Secondary | ICD-10-CM

## 2017-06-03 ENCOUNTER — Other Ambulatory Visit: Payer: Self-pay

## 2017-06-04 ENCOUNTER — Telehealth: Payer: Self-pay | Admitting: *Deleted

## 2017-06-04 NOTE — Telephone Encounter (Signed)
Refill request for Meloxicam. Dr. Milinda Pointer states pt needs to be seen if continuing to have pain. Return fax denying.

## 2017-06-09 ENCOUNTER — Encounter: Payer: Self-pay | Admitting: Internal Medicine

## 2017-06-09 ENCOUNTER — Ambulatory Visit: Payer: BLUE CROSS/BLUE SHIELD | Admitting: Internal Medicine

## 2017-06-09 VITALS — BP 124/68 | HR 68 | Temp 98.0°F | Resp 14 | Ht 71.0 in | Wt 173.4 lb

## 2017-06-09 DIAGNOSIS — E291 Testicular hypofunction: Secondary | ICD-10-CM | POA: Diagnosis not present

## 2017-06-09 DIAGNOSIS — M545 Low back pain: Secondary | ICD-10-CM | POA: Diagnosis not present

## 2017-06-09 MED ORDER — ANDROGEL PUMP 20.25 MG/ACT (1.62%) TD GEL: 2.0000 | Freq: Every day | TRANSDERMAL | 5 refills | Status: DC

## 2017-06-09 MED ORDER — MELOXICAM 7.5 MG PO TABS: 7.5000 mg | ORAL_TABLET | Freq: Every day | ORAL | 6 refills | Status: DC | PRN

## 2017-06-09 NOTE — Patient Instructions (Addendum)
GO TO THE LAB : Get the blood work     GO TO THE FRONT DESK Schedule your next appointment for a physical exam by March 2018  Ok Meloxicam once daily as needed for pain.  Always take it with food because may cause gastritis and ulcers.  If you notice nausea, stomach pain, change in the color of stools --->  Stop the medicine and let Ricardo Fisher know

## 2017-06-09 NOTE — Assessment & Plan Note (Signed)
Hypogonadism: The patient feels significantly better when he uses the brand AndroGel, he has tried other brands and they simply do not work for him (other brands are  Pharmacist, community and Zydus). Currently taking (he thinks) Zydus 1.62%   2 pumps qd. Plan: Check free and total testosterone, prescription for branded AndroGel provided, I am willing to provide a letter of support for the medication that works for him, we may need to call his insurance company again. Back pain: Mild to moderate back pain, sees a chiropractor, request a prescription for meloxicam which in the past worked well for him.  Prescription sent, GI precautions discussed again today. RTC 06-2017 CPX

## 2017-06-09 NOTE — Progress Notes (Signed)
Pre visit review using our clinic review tool, if applicable. No additional management support is needed unless otherwise documented below in the visit note. 
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
## 2017-06-10 LAB — TESTOSTERONE TOTAL,FREE,BIO, MALES
Albumin: 4.3 g/dL (ref 3.6–5.1)
SEX HORMONE BINDING: 71 nmol/L (ref 22–77)
TESTOSTERONE FREE: 33.3 pg/mL — AB (ref 46.0–224.0)
TESTOSTERONE: 490 ng/dL (ref 250–827)
Testosterone, Bioavailable: 65.7 ng/dL — ABNORMAL LOW (ref >=110.0)

## 2017-06-11 ENCOUNTER — Telehealth: Payer: Self-pay

## 2017-06-11 ENCOUNTER — Telehealth: Payer: Self-pay | Admitting: Internal Medicine

## 2017-06-11 NOTE — Telephone Encounter (Signed)
Copied from [place] [id number]. Topic: General - Other >> Jun 11, 2017  3:37 PM Cecelia Byars, NT wrote: Reason for CRM:BC /BS called to let the practice know the prior authorization has been approved for  ANDROGEL PUMP 20.25 MG/ACT (1.62%) GEL .

## 2017-06-11 NOTE — Telephone Encounter (Signed)
Noted  

## 2017-06-11 NOTE — Addendum Note (Signed)
Addended byDamita Dunnings D on: 06/11/2017 12:56 PM   Modules accepted: Orders

## 2017-06-11 NOTE — Telephone Encounter (Signed)
PA initiated via Covermymeds; KEY: [id number]. Received approval, effective 06/11/2017 through 04/20/2038.

## 2017-06-16 ENCOUNTER — Ambulatory Visit: Payer: Self-pay | Admitting: Internal Medicine

## 2017-08-14 ENCOUNTER — Ambulatory Visit: Payer: BLUE CROSS/BLUE SHIELD | Admitting: Family

## 2017-08-14 ENCOUNTER — Encounter: Payer: Self-pay | Admitting: Family

## 2017-08-14 VITALS — BP 114/75 | HR 74 | Temp 98.7°F | Resp 16 | Ht 71.0 in | Wt 173.0 lb

## 2017-08-14 DIAGNOSIS — R509 Fever, unspecified: Secondary | ICD-10-CM

## 2017-08-14 DIAGNOSIS — J029 Acute pharyngitis, unspecified: Secondary | ICD-10-CM

## 2017-08-14 DIAGNOSIS — W57XXXA Bitten or stung by nonvenomous insect and other nonvenomous arthropods, initial encounter: Secondary | ICD-10-CM

## 2017-08-14 DIAGNOSIS — S30861A Insect bite (nonvenomous) of abdominal wall, initial encounter: Secondary | ICD-10-CM

## 2017-08-14 LAB — POC INFLUENZA A&B (BINAX/QUICKVUE)
INFLUENZA A, POC: NEGATIVE
INFLUENZA B, POC: NEGATIVE

## 2017-08-14 LAB — POCT RAPID STREP A (OFFICE): RAPID STREP A SCREEN: NEGATIVE
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
## 2017-08-14 NOTE — Patient Instructions (Signed)
Continue ibuprofen or tylenol as needed for pain.  Drink plenty of fluids and get lot's of rest. Call if new/worsening symptoms/recurrent fever or if symptoms do not continue to improve.    Pharyngitis Pharyngitis is a sore throat (pharynx). There is redness, pain, and swelling of your throat. Follow these instructions at home:  Drink enough fluids to keep your pee (urine) clear or pale yellow.  Only take medicine as told by your doctor. ? You may get sick again if you do not take medicine as told. Finish your medicines, even if you start to feel better. ? Do not take aspirin.  Rest.  Rinse your mouth (gargle) with salt water ( tsp of salt per 1 qt of water) every 1-2 hours. This will help the pain.  If you are not at risk for choking, you can suck on hard candy or sore throat lozenges. Contact a doctor if:  You have large, tender lumps on your neck.  You have a rash.  You cough up green, yellow-Stegmann, or bloody spit. Get help right away if:  You have a stiff neck.  You drool or cannot swallow liquids.  You throw up (vomit) or are not able to keep medicine or liquids down.  You have very bad pain that does not go away with medicine.  You have problems breathing (not from a stuffy nose). This information is not intended to replace advice given to you by your health care provider. Make sure you discuss any questions you have with your health care provider. Document Released: 09/24/2007 Document Revised: 09/13/2015 Document Reviewed: 12/13/2012 Elsevier Interactive Patient Education  2017 [employer].

## 2017-08-28 ENCOUNTER — Encounter: Payer: Self-pay | Admitting: Internal Medicine

## 2017-08-28 ENCOUNTER — Ambulatory Visit (INDEPENDENT_AMBULATORY_CARE_PROVIDER_SITE_OTHER): Payer: BLUE CROSS/BLUE SHIELD | Admitting: Internal Medicine

## 2017-08-28 VITALS — BP 124/68 | HR 68 | Temp 97.8°F | Resp 14 | Ht 71.0 in | Wt 171.0 lb

## 2017-08-28 DIAGNOSIS — E291 Testicular hypofunction: Secondary | ICD-10-CM | POA: Diagnosis not present

## 2017-08-28 DIAGNOSIS — Z0000 Encounter for general adult medical examination without abnormal findings: Secondary | ICD-10-CM

## 2017-08-28 LAB — CBC WITH DIFFERENTIAL/PLATELET
BASOS ABS: 0.1 10*3/uL (ref 0.0–0.1)
Basophils Relative: 0.7 % (ref 0.0–3.0)
EOS ABS: 0.1 10*3/uL (ref 0.0–0.7)
Eosinophils Relative: 1 % (ref 0.0–5.0)
HCT: 42.7 % (ref 39.0–52.0)
Hemoglobin: 14.5 g/dL (ref 13.0–17.0)
LYMPHS ABS: 1.4 10*3/uL (ref 0.7–4.0)
LYMPHS PCT: 15.3 % (ref 12.0–46.0)
MCHC: 34 g/dL (ref 30.0–36.0)
MCV: 94.7 fl (ref 78.0–100.0)
MONO ABS: 0.6 10*3/uL (ref 0.1–1.0)
Monocytes Relative: 6.2 % (ref 3.0–12.0)
NEUTROS ABS: 7 10*3/uL (ref 1.4–7.7)
NEUTROS PCT: 76.8 % (ref 43.0–77.0)
PLATELETS: 246 10*3/uL (ref 150.0–400.0)
RBC: 4.51 Mil/uL (ref 4.22–5.81)
RDW: 13.4 % (ref 11.5–15.5)
WBC: 9.1 10*3/uL (ref 4.0–10.5)

## 2017-08-28 LAB — LIPID PANEL
CHOL/HDL RATIO: 4
Cholesterol: 179 mg/dL (ref 0–200)
HDL: 40.1 mg/dL (ref >39.00)
LDL CALC: 118 mg/dL — AB (ref 0–99)
NonHDL: 138.95
TRIGLYCERIDES: 105 mg/dL (ref 0.0–149.0)
VLDL: 21 mg/dL (ref 0.0–40.0)

## 2017-08-28 LAB — COMPREHENSIVE METABOLIC PANEL
ALT: 17 U/L (ref 0–53)
AST: 26 U/L (ref 0–37)
Albumin: 4.1 g/dL (ref 3.5–5.2)
Alkaline Phosphatase: 37 U/L — ABNORMAL LOW (ref 39–117)
BUN: 18 mg/dL (ref 6–23)
CALCIUM: 9.3 mg/dL (ref 8.4–10.5)
CHLORIDE: 103 meq/L (ref 96–112)
CO2: 31 meq/L (ref 19–32)
CREATININE: 1.02 mg/dL (ref 0.40–1.50)
GFR: 78.69 mL/min (ref >60.00)
Glucose, Bld: 107 mg/dL — ABNORMAL HIGH (ref 70–99)
POTASSIUM: 4.7 meq/L (ref 3.5–5.1)
Sodium: 141 mEq/L (ref 135–145)
Total Bilirubin: 0.4 mg/dL (ref 0.2–1.2)
Total Protein: 7.1 g/dL (ref 6.0–8.3)

## 2017-08-28 LAB — PSA: PSA: 0.12 ng/mL (ref 0.10–4.00)

## 2017-08-28 MED ORDER — CLINDAMYCIN PHOSPHATE 1 % EX LOTN: TOPICAL_LOTION | CUTANEOUS | 3 refills | Status: DC
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
## 2017-08-28 NOTE — Progress Notes (Signed)
Pre visit review using our clinic review tool, if applicable. No additional management support is needed unless otherwise documented below in the visit note. 

## 2017-08-28 NOTE — Assessment & Plan Note (Addendum)
--  Td  2010; pnm 23 01-2015; prevnar 2018   ---CCS:  2006  Cscope Dr Vladimir Faster Normal , reports had another colonoscopy in 2009 with Dr. Paulita Fujita, unable to get records, reportedly normal; due for a Cscope, pt plans to call GI in few months ---Prostate ca screening: on HRT, DRE normal, check a PSA --Lifestyle: very active @ work, diet usually ok ---Tobacco: still smokes, not ready to quit  -- Labs: CMP, FLP, CBC, PSA, testosterone levels

## 2017-08-28 NOTE — Patient Instructions (Signed)
GO TO THE LAB : Get the blood work     GO TO THE FRONT DESK Schedule your next appointment for a checkup in 6 months    Steps to Quit Smoking Smoking tobacco can be harmful to your health and can affect almost every organ in your body. Smoking puts you, and those around you, at risk for developing many serious chronic diseases. Quitting smoking is difficult, but it is one of the best things that you can do for your health. It is never too late to quit. What are the benefits of quitting smoking? When you quit smoking, you lower your risk of developing serious diseases and conditions, such as:  Lung cancer or lung disease, such as COPD.  Heart disease.  Stroke.  Heart attack.  Infertility.  Osteoporosis and bone fractures.  Additionally, symptoms such as coughing, wheezing, and shortness of breath may get better when you quit. You may also find that you get sick less often because your body is stronger at fighting off colds and infections. If you are pregnant, quitting smoking can help to reduce your chances of having a baby of low birth weight. How do I get ready to quit? When you decide to quit smoking, create a plan to make sure that you are successful. Before you quit:  Pick a date to quit. Set a date within the next two weeks to give you time to prepare.  Write down the reasons why you are quitting. Keep this list in places where you will see it often, such as on your bathroom mirror or in your car or wallet.  Identify the people, places, things, and activities that make you want to smoke (triggers) and avoid them. Make sure to take these actions: ? Throw away all cigarettes at home, at work, and in your car. ? Throw away smoking accessories, such as Scientist, research (medical). ? Clean your car and make sure to empty the ashtray. ? Clean your home, including curtains and carpets.  Tell your family, friends, and coworkers that you are quitting. Support from your loved ones can make  quitting easier.  Talk with your health care provider about your options for quitting smoking.  Find out what treatment options are covered by your health insurance.  What strategies can I use to quit smoking? Talk with your healthcare provider about different strategies to quit smoking. Some strategies include:  Quitting smoking altogether instead of gradually lessening how much you smoke over a period of time. Research shows that quitting "cold [place]" is more successful than gradually quitting.  Attending in-person counseling to help you build problem-solving skills. You are more likely to have success in quitting if you attend several counseling sessions. Even short sessions of 10 minutes can be effective.  Finding resources and support systems that can help you to quit smoking and remain smoke-free after you quit. These resources are most helpful when you use them often. They can include: ? Online chats with a Social worker. ? Telephone quitlines. ? Careers information officer. ? Support groups or group counseling. ? Text messaging programs. ? Mobile phone applications.  Taking medicines to help you quit smoking. (If you are pregnant or breastfeeding, talk with your health care provider first.) Some medicines contain nicotine and some do not. Both types of medicines help with cravings, but the medicines that include nicotine help to relieve withdrawal symptoms. Your health care provider may recommend: ? Nicotine patches, gum, or lozenges. ? Nicotine inhalers or sprays. ? Non-nicotine medicine  that is taken by mouth.  Talk with your health care provider about combining strategies, such as taking medicines while you are also receiving in-person counseling. Using these two strategies together makes you more likely to succeed in quitting than if you used either strategy on its own. If you are pregnant or breastfeeding, talk with your health care provider about finding counseling or other  support strategies to quit smoking. Do not take medicine to help you quit smoking unless told to do so by your health care provider. What things can I do to make it easier to quit? Quitting smoking might feel overwhelming at first, but there is a lot that you can do to make it easier. Take these important actions:  Reach out to your family and friends and ask that they support and encourage you during this time. Call telephone quitlines, reach out to support groups, or work with a counselor for support.  Ask people who smoke to avoid smoking around you.  Avoid places that trigger you to smoke, such as bars, parties, or smoke-break areas at work.  Spend time around people who do not smoke.  Lessen stress in your life, because stress can be a smoking trigger for some people. To lessen stress, try: ? Exercising regularly. ? Deep-breathing exercises. ? Yoga. ? Meditating. ? Performing a body scan. This involves closing your eyes, scanning your body from head to toe, and noticing which parts of your body are particularly tense. Purposefully relax the muscles in those areas.  Download or purchase mobile phone or tablet apps (applications) that can help you stick to your quit plan by providing reminders, tips, and encouragement. There are many free apps, such as QuitGuide from the [employer] Office manager for Disease Control and Prevention). You can find other support for quitting smoking (smoking cessation) through smokefree.gov and other websites.  How will I feel when I quit smoking? Within the first 24 hours of quitting smoking, you may start to feel some withdrawal symptoms. These symptoms are usually most noticeable 2-3 days after quitting, but they usually do not last beyond 2-3 weeks. Changes or symptoms that you might experience include:  Mood swings.  Restlessness, anxiety, or irritation.  Difficulty concentrating.  Dizziness.  Strong cravings for sugary foods in addition to nicotine.  Mild  weight gain.  Constipation.  Nausea.  Coughing or a sore throat.  Changes in how your medicines work in your body.  A depressed mood.  Difficulty sleeping (insomnia).  After the first 2-3 weeks of quitting, you may start to notice more positive results, such as:  Improved sense of smell and taste.  Decreased coughing and sore throat.  Slower heart rate.  Lower blood pressure.  Clearer skin.  The ability to breathe more easily.  Fewer sick days.  Quitting smoking is very challenging for most people. Do not get discouraged if you are not successful the first time. Some people need to make many attempts to quit before they achieve long-term success. Do your best to stick to your quit plan, and talk with your health care provider if you have any questions or concerns. This information is not intended to replace advice given to you by your health care provider. Make sure you discuss any questions you have with your health care provider. Document Released: 04/01/2001 Document Revised: 12/04/2015 Document Reviewed: 08/22/2014 Elsevier Interactive Patient Education  [employer].

## 2017-08-29 NOTE — Assessment & Plan Note (Signed)
Hypogonadism: Currently on AndroGel 2 pumps daily, checking labs.  Feels well HTN: Takes only half metoprolol, BP satisfactory Hyperlipidemia: Diet controlled.  Checking labs RTC 6 months

## 2017-08-31 LAB — TESTOSTERONE TOTAL,FREE,BIO, MALES
ALBUMIN MSPROF: 4.1 g/dL (ref 3.6–5.1)
SEX HORMONE BINDING: 59 nmol/L (ref 22–77)
Testosterone, Bioavailable: 113.3 ng/dL (ref >=110.0)
Testosterone, Free: 60.2 pg/mL (ref 46.0–224.0)
Testosterone: 701 ng/dL (ref 250–827)

## 2017-09-23 DIAGNOSIS — Z79891 Long term (current) use of opiate analgesic: Secondary | ICD-10-CM | POA: Diagnosis not present

## 2017-10-15 ENCOUNTER — Telehealth: Payer: Self-pay | Admitting: Internal Medicine

## 2017-10-15 NOTE — Telephone Encounter (Signed)
Was able to find an MAC form on [place] website- completed and faxed to [phone]. Awaiting determination.

## 2017-10-15 NOTE — Telephone Encounter (Signed)
Copied from [place] [phone]. Topic: Quick Communication - See Telephone Encounter >> Oct 15, 2017  4:05 PM Gardiner Ramus wrote: CRM for notification. See Telephone encounter for: 10/15/17. Ginger from [place] called and stated that a Therapist, music for ANDROGEL PUMP 20.25 MG/ACT (1.62%) GEL [[id number]] needed to be filled out.  Please Advise. [phone] please ask to speak to ginger  Pt is out of medication.

## 2017-10-15 NOTE — Telephone Encounter (Signed)
Copied from [place] [phone]. Topic: Quick Communication - Rx Refill/Question >> Oct 15, 2017  4:07 PM Margot Ables wrote: Medication: ANDROGEL PUMP 20.25 MG/ACT (1.62%) GEL  - pt is out of medication and was denied on refilling - pt says that his insurance is requiring a "MAC" price waiver in order for him to continue receiving Androgel (stating that he cannot take the generic). He said that Dr. Larose Kells did a peer to peer call with the insurance to cover the medication but now they need this form. Fax # to return to [phone].  Ok to leave msg on pts home phone #.

## 2017-10-15 NOTE — Telephone Encounter (Signed)
I don't know what a "MAC pricing waiver" is.

## 2017-10-15 NOTE — Telephone Encounter (Signed)
Pt states Ginger at [facility] ph# [phone] ext [phone]. Pt requesting any help that can be given.

## 2017-10-15 NOTE — Telephone Encounter (Signed)
Spoke w/ Altha Harm at [facility] [place]- informed her why I was calling and trying to reach Ginger- I informed her I had the extension number- was informed by Altha Harm that [employer] does not give extension numbers out and she could not help with what a MAC is (they saw no notes in their system). Lin Landsman for her help.

## 2017-10-16 NOTE — Telephone Encounter (Signed)
Pt calling and states that he cannot take the generic brand of the gel due to this caused him to have headaches and a rash. He wanted Kaylan to be aware in case BCBS asked that information.

## 2017-10-16 NOTE — Telephone Encounter (Signed)
Spoke w/ Karyl Kinnier at [facility] updated ID number- [id number]- was informed by her that medication is approved however Pt is requesting the brand name cost be waived to the lower generic cost- BCBS only does this if Pt has had an allergic reaction to the generic lower medication- informed that the Pt has NOT had an allergic reaction to the generic formulation that he prefers the brand name. Brandy verbalized understanding and will note the acct.

## 2017-10-16 NOTE — Telephone Encounter (Signed)
Nikki from [facility] called and stated that she faxed over FDA med watch form. Form needs to be filled out for the pt so that it can be approved. Lexine Baton stated the pt would have to have documentation stating he had a reaction to medication in order for [employer]BS to cover. Please Advise Cb#[phone] option 3 option 1.

## 2017-10-21 DIAGNOSIS — Z79891 Long term (current) use of opiate analgesic: Secondary | ICD-10-CM | POA: Diagnosis not present

## 2017-10-21 NOTE — Telephone Encounter (Signed)
Please advise Dr. Larose Kells. He previously said generic did not work for him- he is now requesting generic? I don't know how else to help Pt- insurance informs me that they do cover brand for him however he will have to pay the extra cost of brand since he has NOT had an allergic reaction to generic.

## 2017-10-21 NOTE — Telephone Encounter (Signed)
Relation to pt: self  Call back number:[phone]   Reason for call:  Patient informed of message below and states since he cant get the brand, next month can PCP prescribe generic (3 pumps instead of 2)? Patient would like a confirmation call that insurance will not cover brand and generic is the only option and would PCP prescribe 3 pumps next,please leave a detail message

## 2017-10-21 NOTE — Telephone Encounter (Signed)
LMOM informing Pt what I found through insurance and what Dr. Larose Kells recommended, unfortunately there is nothing else we can do. If he likes we can refer him to endocrinology for them to take over his testosterone. Instructed him to call if questions/concerns. Okay for PEC to discuss if necessary.

## 2017-10-21 NOTE — Telephone Encounter (Signed)
Advised patient: Options are to pay extra for the branded medication or used a generic testosterone. We did as much as we can.  If needed, refer to endocrinology

## 2017-10-23 ENCOUNTER — Telehealth: Payer: Self-pay

## 2017-10-23 MED ORDER — ANDROGEL PUMP 20.25 MG/ACT (1.62%) TD GEL: 2.0000 | Freq: Every day | TRANSDERMAL | 0 refills | Status: DC

## 2017-10-23 MED ORDER — TESTOSTERONE 20.25 MG/ACT (1.62%) TD GEL: 20.2500 mg | Freq: Every day | TRANSDERMAL | 0 refills | Status: DC

## 2017-10-23 NOTE — Telephone Encounter (Signed)
PEC phoned pt. Re: pt's decision regarding testosterone medication. Pt. Apologizes for the back and forth communication, and stated that even though the brand works better, he cannot afford it, so he will stay with the generic, to be sent to [facility]. Pt. asked Pryor Curia to relay to [facility], CMA and for her to call him at [phone] if he needs to do anything on his end at this point. Androgel changed to testosterone order, rx printed and left at [facility] desk for Dr. Larose Kells signature. Rx to be faxed to [facility] pharmacy on file.

## 2017-10-23 NOTE — Telephone Encounter (Signed)
Mellen, Riccardo Dubin, RN routed conversation to You 1 minute ago (9:06 AM)    Maurine Cane, Riccardo Dubin, RN 14 minutes ago (8:52 AM)       PEC phoned pt. Re: pt's decision regarding testosterone medication. Pt. Apologizes for the back and forth communication, and stated that even though the brand works better, he cannot afford it, so he will stay with the generic, to be sent to [facility]. Pt. asked Pryor Curia to relay to [facility], CMA and for her to call him at [phone] if he needs to do anything on his end at this point. Androgel changed to testosterone order, rx printed and left at [facility] desk for Dr. Larose Kells signature. Rx to be faxed to [facility] pharmacy on file.       Documentation

## 2017-10-23 NOTE — Telephone Encounter (Signed)
Printed Rx signed and faxed to [facility].

## 2017-11-13 DIAGNOSIS — Z79891 Long term (current) use of opiate analgesic: Secondary | ICD-10-CM | POA: Diagnosis not present

## 2017-12-02 DIAGNOSIS — Z79891 Long term (current) use of opiate analgesic: Secondary | ICD-10-CM | POA: Diagnosis not present

## 2017-12-09 ENCOUNTER — Other Ambulatory Visit: Payer: Self-pay | Admitting: Internal Medicine

## 2017-12-09 NOTE — Telephone Encounter (Signed)
Refill request for testosterone.  Last OV: 08/28/2017 Last Fill: 10/23/2017 #75g and 0RF

## 2017-12-09 NOTE — Telephone Encounter (Signed)
Sent, usual dose is 2 pumps qd

## 2018-01-13 DIAGNOSIS — Z79891 Long term (current) use of opiate analgesic: Secondary | ICD-10-CM | POA: Diagnosis not present

## 2018-01-26 ENCOUNTER — Telehealth: Payer: Self-pay | Admitting: Internal Medicine

## 2018-01-26 MED ORDER — TESTOSTERONE 20.25 MG/ACT (1.62%) TD GEL: 40.5000 mg | Freq: Every day | TRANSDERMAL | 3 refills | Status: DC

## 2018-01-26 NOTE — Telephone Encounter (Signed)
Called and notified pt that his refill was sent and that he did not need to come fasting for his appt.

## 2018-01-26 NOTE — Telephone Encounter (Signed)
Copied from [place] [phone]. Topic: Quick Communication - Rx Refill/Question >> Jan 26, 2018  8:16 AM Margot Ables wrote: Medication: Testosterone 20.25 MG/ACT (1.62%) GEL  - pt called to schedule f/u appt for Nov 14 - he will need 1 more refill on the testosterone before his appt - pt asking if he needs to come in fasting when he comes back - please call to notify  Has the patient contacted their pharmacy? Yes - last refill 10/19 Preferred Pharmacy (with phone number or street name): [facility] [phone] - [place], [place]

## 2018-01-26 NOTE — Telephone Encounter (Signed)
Can you inform Pt that he does not need to fast for next appt please? Thank you.

## 2018-01-26 NOTE — Telephone Encounter (Signed)
RF sent  Ok not fasting

## 2018-01-26 NOTE — Telephone Encounter (Signed)
Pt requesting refill on testosterone.   Last OV: 08/28/2017, appt scheduled 03/04/2018 Last Fill: 12/09/2017 #75g and 1RF   Also, Pt wants to know if he should come fasting to next appt?

## 2018-01-28 ENCOUNTER — Telehealth: Payer: Self-pay | Admitting: *Deleted

## 2018-01-28 NOTE — Telephone Encounter (Signed)
Received request for Medical Records from [place]; forwarded to [facility] for email/scan/SLS 10/10

## 2018-02-10 DIAGNOSIS — Z79891 Long term (current) use of opiate analgesic: Secondary | ICD-10-CM | POA: Diagnosis not present

## 2018-03-04 ENCOUNTER — Encounter: Payer: Self-pay | Admitting: Internal Medicine

## 2018-03-04 ENCOUNTER — Ambulatory Visit: Payer: BLUE CROSS/BLUE SHIELD | Admitting: Internal Medicine

## 2018-03-04 VITALS — BP 132/84 | HR 85 | Temp 98.4°F | Resp 16 | Ht 71.0 in | Wt 175.4 lb

## 2018-03-04 DIAGNOSIS — I10 Essential (primary) hypertension: Secondary | ICD-10-CM

## 2018-03-04 DIAGNOSIS — E785 Hyperlipidemia, unspecified: Secondary | ICD-10-CM | POA: Diagnosis not present

## 2018-03-04 DIAGNOSIS — E291 Testicular hypofunction: Secondary | ICD-10-CM | POA: Diagnosis not present

## 2018-03-04 DIAGNOSIS — Z23 Encounter for immunization: Secondary | ICD-10-CM | POA: Diagnosis not present

## 2018-03-04 NOTE — Patient Instructions (Signed)
GO TO THE FRONT DESK Schedule your next appointment for a  Physical 08/2018
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
## 2018-03-04 NOTE — Progress Notes (Signed)
Pre visit review using our clinic review tool, if applicable. No additional management support is needed unless otherwise documented below in the visit note. 

## 2018-03-07 NOTE — Assessment & Plan Note (Signed)
HTN: self d/c metoprolol (d/t cold extremities). amb BPs remain wnl.  We will continue monitoring Hyperlipidemia: Diet controlled, last LDL 118. His calculated CV RF is 16.7 in 10 years.  He qualifies for statins.  This was discussed with the patient, he will think about it. Hypogonadism: On HRT, last level satisfactory Preventive care  --Flu shot today --Lung cancer screening by CT discussed.  Will think about it, cost is an issue. RTC CPX 08/2018

## 2018-05-27 ENCOUNTER — Encounter: Payer: Self-pay | Admitting: Family Medicine

## 2018-05-27 ENCOUNTER — Ambulatory Visit (INDEPENDENT_AMBULATORY_CARE_PROVIDER_SITE_OTHER): Payer: BLUE CROSS/BLUE SHIELD | Admitting: Family Medicine

## 2018-05-27 DIAGNOSIS — M79672 Pain in left foot: Secondary | ICD-10-CM

## 2018-05-27 DIAGNOSIS — M79671 Pain in right foot: Secondary | ICD-10-CM | POA: Diagnosis not present

## 2018-05-27 NOTE — Patient Instructions (Signed)
Nice to meet you  Please try to get the heel lifts 3/8" and 7/16"  Please try to get the green sport insoles or bring the insoles if you have some similar to them  Follow up with me in 4 weeks and we'll try adjusting your insoles.

## 2018-05-27 NOTE — Assessment & Plan Note (Signed)
He has a leg length discrepancy that is likely contributing to his pain. 

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
## 2018-06-07 ENCOUNTER — Telehealth: Payer: Self-pay

## 2018-06-07 NOTE — Telephone Encounter (Signed)
Copied from [place] [id number]. Topic: General - Inquiry >> Jun 07, 2018  7:10 AM Scherrie Gerlach wrote: Reason for CRM: pt wants to know if Dr Raeford Razor has gotten the materials needed to built / modify his inserts. Pt has gotten the inserts you wanted him to get and pt hopes you have gotten what you need. Pt is hoping to get this done this week, as he will be traveling soon,

## 2018-06-08 NOTE — Telephone Encounter (Signed)
Pt aware that materials are in for his orthotics and appt made for 06/10/2018 at 1030

## 2018-06-09 ENCOUNTER — Telehealth: Payer: Self-pay | Admitting: Internal Medicine

## 2018-06-10 ENCOUNTER — Ambulatory Visit: Payer: BLUE CROSS/BLUE SHIELD | Admitting: Family Medicine

## 2018-06-10 DIAGNOSIS — M79672 Pain in left foot: Secondary | ICD-10-CM | POA: Diagnosis not present

## 2018-06-10 DIAGNOSIS — M79671 Pain in right foot: Secondary | ICD-10-CM | POA: Diagnosis not present
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
## 2018-06-10 NOTE — Assessment & Plan Note (Signed)
He has had improvement with the heel lift due to the leg length discrepancy.  He brought the green sport insoles today and one was added with padding to try to float the longitudinal plantar fascia.  The other padding was cut out to try to float in a different manner.  We will continue to tweak his insoles.  May need to obtain metatarsal cookies at some point.

## 2018-06-16 MED ORDER — MELOXICAM 7.5 MG PO TABS: 7.5000 mg | ORAL_TABLET | Freq: Every day | ORAL | 1 refills | Status: DC | PRN

## 2018-06-16 NOTE — Telephone Encounter (Signed)
Please advise. Pt requesting refill on meloxicam.

## 2018-06-16 NOTE — Telephone Encounter (Signed)
Pt called and stated that his refill for meloxicam (MOBIC) 7.5 MG tablet  was denied. Pt said he had an Rx with 5 refills but he hasn't gone through them or picked up any of them besides 1. The date on the refills have expired and he'd like a 90 day supply sent or called in to the pharmacy / please call and advise

## 2018-06-16 NOTE — Telephone Encounter (Signed)
Rx re-sent for 90 day supply and 1 refill.

## 2018-06-16 NOTE — Telephone Encounter (Signed)
I sent a prescription for 30 and 1 refill.  Please cancel and send a prescription for 90 tablets and 1 refill.

## 2018-06-16 NOTE — Addendum Note (Signed)
Addended byDamita Dunnings D on: 06/16/2018 01:01 PM   Modules accepted: Orders

## 2018-06-16 NOTE — Addendum Note (Signed)
Addended by: Kathlene November E on: 06/16/2018 12:55 PM   Modules accepted: Orders

## 2018-07-06 ENCOUNTER — Other Ambulatory Visit: Payer: Self-pay | Admitting: Internal Medicine

## 2018-07-06 NOTE — Telephone Encounter (Signed)
Pt is requesting refill on testosterone.   Last OV: 03/04/2018 Last Fill: 01/26/2018 #75g and 3RF

## 2018-08-06 ENCOUNTER — Telehealth: Payer: Self-pay | Admitting: Internal Medicine

## 2018-08-06 MED ORDER — TESTOSTERONE 20.25 MG/ACT (1.62%) TD GEL: TRANSDERMAL | 1 refills | Status: DC

## 2018-08-06 NOTE — Telephone Encounter (Signed)
Requested medication (s) are due for refill today: yes  Requested medication (s) are on the active medication list: yes  Last refill:  07/06/2018  Future visit scheduled: yes  Notes to clinic:  Testosterone refills not delegated to NT to refill   Requested Prescriptions  Pending Prescriptions Disp Refills   Testosterone 20.25 MG/ACT (1.62%) GEL 75 g 0    Sig: APPLY 40.5 MG(2 PUMPS) TOPICALLY DAILY     Off-Protocol Failed - 08/06/2018  9:07 AM      Failed - Medication not assigned to a protocol, review manually.      Passed - Valid encounter within last 12 months    Recent Outpatient Visits          1 month ago Bilateral foot pain   [facility] Raef[facility] Razor, Enid Baas, MD   2 months ago Bilateral foot pain   [facility], Enid Baas, MD   5 months ago Essential hypertension   [facility], MD   11 months ago Annual physical exam   [facility], MD   11 months ago Viral pharyngitis   [facility], NP      Future Appointments            In 3 weeks Colon Branch, MD [facility], [place]

## 2018-08-06 NOTE — Telephone Encounter (Signed)
Sent!

## 2018-08-06 NOTE — Telephone Encounter (Signed)
Pt requesting refill on testosterone.   Last OV: 03/04/2018 Last Fill: 07/06/2018 #75g and 0RF

## 2018-08-31 ENCOUNTER — Other Ambulatory Visit: Payer: Self-pay

## 2018-08-31 ENCOUNTER — Ambulatory Visit (INDEPENDENT_AMBULATORY_CARE_PROVIDER_SITE_OTHER): Payer: BLUE CROSS/BLUE SHIELD | Admitting: Internal Medicine

## 2018-08-31 ENCOUNTER — Encounter: Payer: Self-pay | Admitting: Internal Medicine

## 2018-08-31 DIAGNOSIS — E291 Testicular hypofunction: Secondary | ICD-10-CM

## 2018-08-31 DIAGNOSIS — Z0000 Encounter for general adult medical examination without abnormal findings: Secondary | ICD-10-CM | POA: Diagnosis not present

## 2018-08-31 NOTE — Assessment & Plan Note (Signed)
HTN: Reports normal ambulatory BPs, currently on no medications Hyperlipidemia: Diet controlled, checking labs Hypogonadism: On HRT, checking testosterone, CBC and PSA Feet pain: On occasional meloxicam, symptoms improving. Lung cancer screening: Will discuss on RTC Plan: Labs this week Follow-up 6 months

## 2018-08-31 NOTE — Assessment & Plan Note (Signed)
--  Td  2010; pnm 23 01-2015; prevnar 2018   ---CCS:  2006  Cscope Dr Vladimir Faster Normal , reports had another colonoscopy in 2009 with Dr. Paulita Fujita, unable to get records, reportedly normal; due for a Cscope, pt couldn't proceed so far, declined referral, plans to get it at some point this year ---Prostate ca screening: on HRT, this is a virtual visit, DRE was normal last year, check a PSA --Lifestyle: Doing well, very active, taking good COVID-19 precautions ---Tobacco: still smokes, not ready to quit, encouraged to call for possible medications when ready -- Labs: CMP, FLP, CBC, A1c, PSA, free and total testosterone
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
## 2018-09-06 ENCOUNTER — Encounter: Payer: BLUE CROSS/BLUE SHIELD | Admitting: Internal Medicine

## 2018-09-07 ENCOUNTER — Encounter: Payer: BLUE CROSS/BLUE SHIELD | Admitting: Internal Medicine

## 2018-09-09 ENCOUNTER — Other Ambulatory Visit (INDEPENDENT_AMBULATORY_CARE_PROVIDER_SITE_OTHER): Payer: BLUE CROSS/BLUE SHIELD

## 2018-09-09 ENCOUNTER — Other Ambulatory Visit: Payer: Self-pay

## 2018-09-09 DIAGNOSIS — Z0000 Encounter for general adult medical examination without abnormal findings: Secondary | ICD-10-CM | POA: Diagnosis not present

## 2018-09-09 DIAGNOSIS — E291 Testicular hypofunction: Secondary | ICD-10-CM

## 2018-09-09 LAB — CBC WITH DIFFERENTIAL/PLATELET
Basophils Absolute: 0.1 10*3/uL (ref 0.0–0.1)
Basophils Relative: 0.6 % (ref 0.0–3.0)
Eosinophils Absolute: 0.3 10*3/uL (ref 0.0–0.7)
Eosinophils Relative: 3.9 % (ref 0.0–5.0)
HCT: 44.8 % (ref 39.0–52.0)
Hemoglobin: 15.6 g/dL (ref 13.0–17.0)
Lymphocytes Relative: 23.2 % (ref 12.0–46.0)
Lymphs Abs: 2 10*3/uL (ref 0.7–4.0)
MCHC: 34.9 g/dL (ref 30.0–36.0)
MCV: 93.5 fl (ref 78.0–100.0)
Monocytes Absolute: 0.7 10*3/uL (ref 0.1–1.0)
Monocytes Relative: 7.7 % (ref 3.0–12.0)
Neutro Abs: 5.6 10*3/uL (ref 1.4–7.7)
Neutrophils Relative %: 64.6 % (ref 43.0–77.0)
Platelets: 192 10*3/uL (ref 150.0–400.0)
RBC: 4.79 Mil/uL (ref 4.22–5.81)
RDW: 12.7 % (ref 11.5–15.5)
WBC: 8.6 10*3/uL (ref 4.0–10.5)

## 2018-09-09 LAB — COMPREHENSIVE METABOLIC PANEL
ALT: 14 U/L (ref 0–53)
AST: 20 U/L (ref 0–37)
Albumin: 4.1 g/dL (ref 3.5–5.2)
Alkaline Phosphatase: 47 U/L (ref 39–117)
BUN: 20 mg/dL (ref 6–23)
CO2: 31 mEq/L (ref 19–32)
Calcium: 9.2 mg/dL (ref 8.4–10.5)
Chloride: 101 mEq/L (ref 96–112)
Creatinine, Ser: 1.01 mg/dL (ref 0.40–1.50)
GFR: 74.63 mL/min (ref >60.00)
Glucose, Bld: 108 mg/dL — ABNORMAL HIGH (ref 70–99)
Potassium: 4.6 mEq/L (ref 3.5–5.1)
Sodium: 139 mEq/L (ref 135–145)
Total Bilirubin: 0.5 mg/dL (ref 0.2–1.2)
Total Protein: 7.3 g/dL (ref 6.0–8.3)

## 2018-09-09 LAB — LIPID PANEL
Cholesterol: 215 mg/dL — ABNORMAL HIGH (ref 0–200)
HDL: 49.9 mg/dL (ref >39.00)
LDL Cholesterol: 141 mg/dL — ABNORMAL HIGH (ref 0–99)
NonHDL: 165.21
Total CHOL/HDL Ratio: 4
Triglycerides: 123 mg/dL (ref 0.0–149.0)
VLDL: 24.6 mg/dL (ref 0.0–40.0)

## 2018-09-09 LAB — HEMOGLOBIN A1C: Hgb A1c MFr Bld: 5.7 % (ref 4.6–6.5)

## 2018-09-09 LAB — PSA: PSA: 0.08 ng/mL — ABNORMAL LOW (ref 0.10–4.00)

## 2018-09-10 LAB — TESTOSTERONE TOTAL,FREE,BIO, MALES
Albumin: 4 g/dL (ref 3.6–5.1)
Sex Hormone Binding: 76 nmol/L (ref 22–77)
Testosterone, Bioavailable: 84.8 ng/dL — ABNORMAL LOW (ref >=110.0)
Testosterone, Free: 46.1 pg/mL (ref 46.0–224.0)
Testosterone: 689 ng/dL (ref 250–827)

## 2018-09-14 ENCOUNTER — Telehealth: Payer: Self-pay | Admitting: Internal Medicine

## 2018-09-14 ENCOUNTER — Ambulatory Visit (INDEPENDENT_AMBULATORY_CARE_PROVIDER_SITE_OTHER): Payer: BLUE CROSS/BLUE SHIELD | Admitting: Internal Medicine

## 2018-09-14 ENCOUNTER — Other Ambulatory Visit: Payer: Self-pay

## 2018-09-14 DIAGNOSIS — E291 Testicular hypofunction: Secondary | ICD-10-CM

## 2018-09-14 DIAGNOSIS — E785 Hyperlipidemia, unspecified: Secondary | ICD-10-CM

## 2018-09-14 DIAGNOSIS — J019 Acute sinusitis, unspecified: Secondary | ICD-10-CM | POA: Diagnosis not present

## 2018-09-14 MED ORDER — AZITHROMYCIN 250 MG PO TABS: ORAL_TABLET | ORAL | 0 refills | Status: DC

## 2018-09-14 MED ORDER — AZELASTINE HCL 0.1 % NA SOLN: 2.0000 | Freq: Two times a day (BID) | NASAL | 6 refills | Status: DC

## 2018-09-14 MED ORDER — PREDNISONE 10 MG PO TABS: ORAL_TABLET | ORAL | 0 refills | Status: DC

## 2018-09-14 NOTE — Telephone Encounter (Signed)
Had virtual visit 08/31/2018 for cpe- do not see where this was discussed. Needs virtual visit.
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
## 2018-09-14 NOTE — Telephone Encounter (Signed)
Copied from [place] [id number]. Topic: Quick Communication - See Telephone Encounter >> Sep 14, 2018  8:14 AM Robina Ade, Helene Kelp D wrote: CRM for notification. See Telephone encounter for: 09/14/18. Patient called and said that he has had a bad sinus infection for 6 weeks and wants to know if Dr. Larose Kells can call him in something in more stronger for him to take. If possible send something in to [facility], if any questions feel free to call patient back, thanks.

## 2018-09-15 NOTE — Assessment & Plan Note (Signed)
Sinusitis: Bacterial versus allergic, recommend to continue saline nasal spray, will add Astelin, a low-dose prednisone and a Z-Pak. Hyperlipidemia: LDL increased, he admits to an unhealthy diet lately, we agreed on recheck a FLP when he comes back in November Hypogonadism: Testosterone levels appropriate, interestingly PSA is low, CBC normal.  Continue supplements. COVID-19: In the absence of fever, chills, chest pain, cough or difficulty breathing, I doubt his sinus symptoms are Covid related, nevertheless he knows to seek immediate attention if he is not improving with treatment RTC November 2020, will call and schedule

## 2018-10-04 ENCOUNTER — Telehealth: Payer: Self-pay | Admitting: Internal Medicine

## 2018-10-04 NOTE — Telephone Encounter (Signed)
Testosterone refill.   Last OV: 08/31/2018 Last Fill: 08/06/2018 #75g and 1RF

## 2018-10-05 NOTE — Telephone Encounter (Signed)
Sent!

## 2018-10-05 NOTE — Telephone Encounter (Signed)
Pt calling to check status of refill.  Please advise

## 2018-10-07 ENCOUNTER — Telehealth: Payer: Self-pay | Admitting: Internal Medicine

## 2018-10-07 NOTE — Telephone Encounter (Signed)
Medication Refill - Medication: Testosterone 20.25 MG/ACT (1.62%) GEL [[id number]]    Has the patient contacted their pharmacy? Yes.   (Agent: If no, request that the patient contact the pharmacy for the refill.) (Agent: If yes, when and what did the pharmacy advise?) The patient has been trying to get this medication for a few days and is completely out of this gel. One pharmacy did not have it so he found it at another one.  Preferred Pharmacy (with phone number or street name): [facility] [place], [address] AT [address]  Agent: Please be advised that RX refills may take up to 3 business days. We ask that you follow-up with your pharmacy.

## 2018-10-08 MED ORDER — TESTOSTERONE 20.25 MG/ACT (1.62%) TD GEL: TRANSDERMAL | 5 refills | Status: DC

## 2018-10-08 NOTE — Telephone Encounter (Signed)
SENT 

## 2018-10-08 NOTE — Telephone Encounter (Signed)
Can you resend the testosterone please?

## 2019-03-21 ENCOUNTER — Telehealth: Payer: Self-pay | Admitting: Internal Medicine

## 2019-03-21 MED ORDER — TESTOSTERONE 20.25 MG/ACT (1.62%) TD GEL: TRANSDERMAL | 5 refills | Status: DC

## 2019-03-21 NOTE — Telephone Encounter (Signed)
Testosterone refill.   Last OV: 09/14/2018 Last Fill: 10/08/2018 #75g and 5RF

## 2019-03-21 NOTE — Telephone Encounter (Signed)
rx refill Testosterone 20.25 MG/ACT (1.62%) GEL  PHARMACY [facility] [address] [place], [place] [address] [phone]

## 2019-03-21 NOTE — Telephone Encounter (Signed)
Sent!

## 2019-03-30 ENCOUNTER — Other Ambulatory Visit: Payer: Self-pay

## 2019-03-31 ENCOUNTER — Other Ambulatory Visit: Payer: Self-pay

## 2019-03-31 ENCOUNTER — Ambulatory Visit (INDEPENDENT_AMBULATORY_CARE_PROVIDER_SITE_OTHER): Payer: BC Managed Care – PPO | Admitting: Internal Medicine

## 2019-03-31 ENCOUNTER — Encounter: Payer: Self-pay | Admitting: Internal Medicine

## 2019-03-31 VITALS — BP 165/75 | HR 81 | Temp 96.6°F | Resp 18 | Ht 71.0 in | Wt 173.4 lb

## 2019-03-31 DIAGNOSIS — I10 Essential (primary) hypertension: Secondary | ICD-10-CM

## 2019-03-31 DIAGNOSIS — Z23 Encounter for immunization: Secondary | ICD-10-CM

## 2019-03-31 DIAGNOSIS — E291 Testicular hypofunction: Secondary | ICD-10-CM

## 2019-03-31 MED ORDER — TESTOSTERONE 20.25 MG/ACT (1.62%) TD GEL: TRANSDERMAL | 5 refills | Status: DC

## 2019-03-31 NOTE — Progress Notes (Signed)
Pre visit review using our clinic review tool, if applicable. No additional management support is needed unless otherwise documented below in the visit note. 

## 2019-03-31 NOTE — Patient Instructions (Addendum)
GO TO THE LAB : Get the blood work     GO TO THE FRONT DESK Schedule your next appointment   for a physical exam by May 2021   Check the  blood pressure weekly BP GOAL is between 110/65 and  135/85. If it is consistently higher or lower, let me know
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
## 2019-04-01 LAB — TESTOSTERONE TOTAL,FREE,BIO, MALES
Albumin: 4.1 g/dL (ref 3.6–5.1)
Sex Hormone Binding: 64 nmol/L (ref 22–77)
Testosterone, Bioavailable: 65.8 ng/dL — ABNORMAL LOW (ref >=110.0)
Testosterone, Free: 35 pg/mL — ABNORMAL LOW (ref 46.0–224.0)
Testosterone: 465 ng/dL (ref 250–827)

## 2019-04-03 NOTE — Assessment & Plan Note (Signed)
HTN: Currently on no medication, BPs are typically okay at home, BP slightly elevated today, recommend to monitor weekly and let me know if they are elevated Hypogonadism: Checking levels, send a new prescription to his new pharmacy. Preventive care: Tdap today, had a flu shot. RTC 08/2019 CPX

## 2019-04-04 NOTE — Addendum Note (Signed)
Addended byDamita Dunnings D on: 04/04/2019 07:54 AM   Modules accepted: Orders

## 2019-04-18 ENCOUNTER — Telehealth: Payer: Self-pay | Admitting: Internal Medicine

## 2019-04-18 MED ORDER — TESTOSTERONE 20.25 MG/ACT (1.62%) TD GEL: TRANSDERMAL | 5 refills | Status: DC

## 2019-04-18 NOTE — Telephone Encounter (Signed)
Medication Problem   Testosterone 20.25 MG/ACT (1.62%) GEL [id number]   Pt called and stated that pharmacy has not received medication. Class states historical provider. Can we please resend. Pt states that he use to only use 2 pumps and now uses 3. Pt thinks this may be the issue with the refill but not sure.  Pt also states that his insurance will change the 1st of the year and may need a PA after that. Please advise

## 2019-04-18 NOTE — Telephone Encounter (Signed)
Advise patient: The prescription was sent but I just sent it again. If he continues to have problems, we probably need to print the prescription and let him  take it  To his pharmacist

## 2019-04-19 NOTE — Telephone Encounter (Signed)
Patient notified via detailed message.

## 2019-05-09 ENCOUNTER — Telehealth: Payer: Self-pay | Admitting: Internal Medicine

## 2019-05-09 NOTE — Telephone Encounter (Signed)
Per patient, he needs a prior authorization with his new insurance for the medication to be filled: Testosterone gel / Patient states he has previously called about this issue.

## 2019-05-09 NOTE — Telephone Encounter (Signed)
I tried initiating a PA on Covermymeds but says that there is one already pending- did you initiate one?

## 2019-05-10 NOTE — Telephone Encounter (Signed)
PA approved through 05/09/2020.

## 2019-05-10 NOTE — Telephone Encounter (Signed)
Found request. PA initiated via Covermymeds; KEY: [id number]. Awaiting determination.

## 2019-06-07 ENCOUNTER — Telehealth: Payer: Self-pay | Admitting: Internal Medicine

## 2019-06-07 NOTE — Telephone Encounter (Signed)
Patient has a question about his lab appointment next week,patient has a lab appointment for testosterone. He would like to know if he should apply his topical testosterone gel prior to his appointment that morning before las draw.  Please advise

## 2019-06-07 NOTE — Telephone Encounter (Signed)
LMOM informing Pt okay to take testosterone gel morning of lab appt. Instructed to call if questions/concerns.

## 2019-06-07 NOTE — Telephone Encounter (Signed)
Okay to use his testosterone gel as usual.

## 2019-06-07 NOTE — Telephone Encounter (Signed)
Please advise 

## 2019-06-10 ENCOUNTER — Ambulatory Visit: Payer: 59 | Attending: Internal Medicine

## 2019-06-10 DIAGNOSIS — Z23 Encounter for immunization: Secondary | ICD-10-CM | POA: Insufficient documentation

## 2019-06-10 NOTE — Progress Notes (Signed)
   Covid-19 Vaccination Clinic  Name:  Ricardo Fisher    MRN: [id number] DOB: 03-17-1956  06/10/2019  [name] was observed post Covid-19 immunization for 15 minutes without incidence. He was provided with Vaccine Information Sheet and instruction to access the V-Safe system.   [name] was instructed to call 911 with any severe reactions post vaccine: Marland Kitchen Difficulty breathing  . Swelling of your face and throat  . A fast heartbeat  . A bad rash all over your body  . Dizziness and weakness    Immunizations Administered    Name Date Dose VIS Date Route   Pfizer COVID-19 Vaccine 06/10/2019 10:03 AM 0.3 mL 04/01/2019 Intramuscular   Manufacturer: [place]   Lot: [id number]   [place]: [id number]

## 2019-06-14 ENCOUNTER — Other Ambulatory Visit (INDEPENDENT_AMBULATORY_CARE_PROVIDER_SITE_OTHER): Payer: 59

## 2019-06-14 ENCOUNTER — Telehealth: Payer: Self-pay | Admitting: Internal Medicine

## 2019-06-14 ENCOUNTER — Other Ambulatory Visit: Payer: Self-pay

## 2019-06-14 DIAGNOSIS — E291 Testicular hypofunction: Secondary | ICD-10-CM | POA: Diagnosis not present

## 2019-06-14 NOTE — Telephone Encounter (Signed)
Pt came in office for labs on 06-14-2019 and pt stated would like to have a copy of his labs  mailed to his address when results are ready. Please advise.

## 2019-06-14 NOTE — Telephone Encounter (Signed)
Noted  

## 2019-06-15 LAB — TESTOSTERONE TOTAL,FREE,BIO, MALES
Albumin: 3.8 g/dL (ref 3.6–5.1)
Sex Hormone Binding: 74 nmol/L (ref 22–77)
Testosterone, Bioavailable: 130.1 ng/dL (ref >=110.0)
Testosterone, Free: 74.2 pg/mL (ref 46.0–224.0)
Testosterone: 1004 ng/dL — ABNORMAL HIGH (ref 250–827)

## 2019-06-17 NOTE — Telephone Encounter (Signed)
Results mailed 

## 2019-07-05 ENCOUNTER — Ambulatory Visit: Payer: 59 | Attending: Internal Medicine

## 2019-07-05 DIAGNOSIS — Z23 Encounter for immunization: Secondary | ICD-10-CM

## 2019-07-05 NOTE — Progress Notes (Signed)
   Covid-19 Vaccination Clinic  Name:  FENNER PROBUS    MRN: [id number] DOB: 02/08/1956  07/05/2019  Mr. Mcclusky was observed post Covid-19 immunization for 15 minutes without incident. He was provided with Vaccine Information Sheet and instruction to access the V-Safe system.   Mr. Venhuizen was instructed to call 911 with any severe reactions post vaccine: Marland Kitchen Difficulty breathing  . Swelling of face and throat  . A fast heartbeat  . A bad rash all over body  . Dizziness and weakness   Immunizations Administered    Name Date Dose VIS Date Route   Pfizer COVID-19 Vaccine 07/05/2019  9:37 AM 0.3 mL 04/01/2019 Intramuscular   Manufacturer: [place]   Lot: [id number]   [place]: [id number]

## 2019-07-15 ENCOUNTER — Other Ambulatory Visit: Payer: Self-pay

## 2019-07-15 ENCOUNTER — Ambulatory Visit (INDEPENDENT_AMBULATORY_CARE_PROVIDER_SITE_OTHER): Payer: 59 | Admitting: Internal Medicine

## 2019-07-15 ENCOUNTER — Encounter: Payer: Self-pay | Admitting: Internal Medicine

## 2019-07-15 VITALS — BP 156/81 | HR 78 | Temp 97.9°F | Resp 16 | Ht 71.0 in | Wt 176.0 lb

## 2019-07-15 DIAGNOSIS — R319 Hematuria, unspecified: Secondary | ICD-10-CM | POA: Diagnosis not present

## 2019-07-15 DIAGNOSIS — N390 Urinary tract infection, site not specified: Secondary | ICD-10-CM | POA: Diagnosis not present

## 2019-07-15 DIAGNOSIS — R1030 Lower abdominal pain, unspecified: Secondary | ICD-10-CM

## 2019-07-15 LAB — URINALYSIS, ROUTINE W REFLEX MICROSCOPIC
Bilirubin Urine: NEGATIVE
Ketones, ur: NEGATIVE
Leukocytes,Ua: NEGATIVE
Nitrite: NEGATIVE
Specific Gravity, Urine: 1.01 (ref 1.000–1.030)
Total Protein, Urine: NEGATIVE
Urine Glucose: NEGATIVE
Urobilinogen, UA: 0.2 (ref 0.0–1.0)
pH: 6.5 (ref 5.0–8.0)

## 2019-07-15 LAB — POC URINALSYSI DIPSTICK (AUTOMATED)
Bilirubin, UA: NEGATIVE
Glucose, UA: NEGATIVE
Ketones, UA: NEGATIVE
Leukocytes, UA: NEGATIVE
Nitrite, UA: NEGATIVE
Protein, UA: NEGATIVE
Spec Grav, UA: 1.015 (ref 1.010–1.025)
Urobilinogen, UA: 0.2 E.U./dL
pH, UA: 6 (ref 5.0–8.0)

## 2019-07-15 LAB — PSA: PSA: 0.1 ng/mL (ref 0.10–4.00)

## 2019-07-15 MED ORDER — SULFAMETHOXAZOLE-TRIMETHOPRIM 800-160 MG PO TABS: 1.0000 | ORAL_TABLET | Freq: Two times a day (BID) | ORAL | 0 refills | Status: DC

## 2019-07-15 MED ORDER — METOPROLOL SUCCINATE ER 25 MG PO TB24: 12.5000 mg | ORAL_TABLET | Freq: Every day | ORAL | 1 refills | Status: DC

## 2019-07-15 NOTE — Progress Notes (Signed)
Pre visit review using our clinic review tool, if applicable. No additional management support is needed unless otherwise documented below in the visit note. 
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
## 2019-07-15 NOTE — Patient Instructions (Signed)
Start Bactrim for possible UTI.  Avoid excessive sun exposure Call if not gradually better  Go back on metoprolol half tablet daily.  Check the  blood pressure 2 or 3 times a week BP GOAL is between 110/65 and  135/85. If it is consistently higher or lower, let me know     GO TO THE LAB : Get the blood work    See you in May.  Sooner if needed

## 2019-07-15 NOTE — Assessment & Plan Note (Signed)
UTI: Symptoms consistent with UTI, no penile discharge, denies high risk sexual behaviors.  DRE is normal. U dip: Trace blood. Plan: UA, urine culture and for completeness a PSA. Empiric Bactrim for now pending results.

## 2019-07-16 LAB — URINE CULTURE
MICRO NUMBER:: 10297050
Result:: NO GROWTH
SPECIMEN QUALITY:: ADEQUATE

## 2019-07-20 ENCOUNTER — Telehealth: Payer: Self-pay

## 2019-07-20 NOTE — Telephone Encounter (Signed)
Pt called before lunch regarding lab results. Informed I'd have to call back after lunch.    Damita Dunnings, [place]  07/18/2019 1:00 PM EDT    [facility] informing Pt of results and recommendations. Instructed to call if questions/concerns or if he is not improving.    Colon Branch, MD  07/18/2019 12:57 PM EDT    Please call the patient, there is no much evidence of a urinary infection based on results. Let me know if he is improving and stop Bactrim   Spoke w/ Pt- informed of results and recommendations. Pt believes maybe its his tight fitting jeans causing the issue since he crouches at work most of the day. He will let us know if pain isn't going away.

## 2019-08-01 ENCOUNTER — Telehealth (INDEPENDENT_AMBULATORY_CARE_PROVIDER_SITE_OTHER): Payer: 59 | Admitting: Internal Medicine

## 2019-08-01 ENCOUNTER — Encounter: Payer: Self-pay | Admitting: Internal Medicine

## 2019-08-01 VITALS — Ht 71.0 in

## 2019-08-01 DIAGNOSIS — J40 Bronchitis, not specified as acute or chronic: Secondary | ICD-10-CM | POA: Diagnosis not present

## 2019-08-01 DIAGNOSIS — J019 Acute sinusitis, unspecified: Secondary | ICD-10-CM

## 2019-08-01 DIAGNOSIS — J9801 Acute bronchospasm: Secondary | ICD-10-CM

## 2019-08-01 MED ORDER — PREDNISONE 10 MG PO TABS: ORAL_TABLET | ORAL | 0 refills | Status: DC

## 2019-08-01 MED ORDER — AZITHROMYCIN 250 MG PO TABS: ORAL_TABLET | ORAL | 0 refills | Status: DC
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
## 2019-08-01 NOTE — Progress Notes (Signed)
Pre visit review using our clinic review tool, if applicable. No additional management support is needed unless otherwise documented below in the visit note. 

## 2019-08-02 NOTE — Assessment & Plan Note (Addendum)
Sinusitis, bronchitis, bronchospasm: Upper respiratory symptoms probably related to allergies versus intercurrent URI. He did have a low-grade temperature 3 days ago, I recommended a Covid test but he is hesitant because he already completed his 2 injections a month ago.  We will pursue testing if he is not improving. Plan: Good hydration. Mucinex DM, Claritin OTC Zithromax Albuterol 2 puffs 4 times daily, as needed.  Has not used it before, instructions provided Prednisone for few days Call if not better particularly fever chills, chest pain or difficulty breathing. The patient took notes of all instructions and verbalized understanding. UTI: See last visit, work-up was negative, he took Bactrim for few days and symptoms went away.  Currently asymptomatic.  Observation for now.

## 2019-08-30 ENCOUNTER — Encounter: Payer: Self-pay | Admitting: Internal Medicine

## 2019-08-30 ENCOUNTER — Ambulatory Visit (INDEPENDENT_AMBULATORY_CARE_PROVIDER_SITE_OTHER): Payer: 59 | Admitting: Internal Medicine

## 2019-08-30 ENCOUNTER — Other Ambulatory Visit: Payer: Self-pay

## 2019-08-30 VITALS — BP 132/56 | HR 76 | Temp 97.5°F | Resp 18 | Ht 71.0 in | Wt 170.0 lb

## 2019-08-30 DIAGNOSIS — Z0000 Encounter for general adult medical examination without abnormal findings: Secondary | ICD-10-CM

## 2019-08-30 DIAGNOSIS — E785 Hyperlipidemia, unspecified: Secondary | ICD-10-CM | POA: Diagnosis not present

## 2019-08-30 LAB — COMPREHENSIVE METABOLIC PANEL
ALT: 12 U/L (ref 0–53)
AST: 20 U/L (ref 0–37)
Albumin: 4.2 g/dL (ref 3.5–5.2)
Alkaline Phosphatase: 41 U/L (ref 39–117)
BUN: 19 mg/dL (ref 6–23)
CO2: 31 mEq/L (ref 19–32)
Calcium: 9.5 mg/dL (ref 8.4–10.5)
Chloride: 103 mEq/L (ref 96–112)
Creatinine, Ser: 1.02 mg/dL (ref 0.40–1.50)
GFR: 73.56 mL/min (ref >60.00)
Glucose, Bld: 110 mg/dL — ABNORMAL HIGH (ref 70–99)
Potassium: 5.1 mEq/L (ref 3.5–5.1)
Sodium: 138 mEq/L (ref 135–145)
Total Bilirubin: 0.6 mg/dL (ref 0.2–1.2)
Total Protein: 7.2 g/dL (ref 6.0–8.3)

## 2019-08-30 LAB — LIPID PANEL
Cholesterol: 208 mg/dL — ABNORMAL HIGH (ref 0–200)
HDL: 39.7 mg/dL (ref >39.00)
LDL Cholesterol: 142 mg/dL — ABNORMAL HIGH (ref 0–99)
NonHDL: 168.73
Total CHOL/HDL Ratio: 5
Triglycerides: 134 mg/dL (ref 0.0–149.0)
VLDL: 26.8 mg/dL (ref 0.0–40.0)

## 2019-08-30 LAB — CBC WITH DIFFERENTIAL/PLATELET
Basophils Absolute: 0.1 10*3/uL (ref 0.0–0.1)
Basophils Relative: 0.7 % (ref 0.0–3.0)
Eosinophils Absolute: 0.1 10*3/uL (ref 0.0–0.7)
Eosinophils Relative: 1.3 % (ref 0.0–5.0)
HCT: 43.8 % (ref 39.0–52.0)
Hemoglobin: 14.9 g/dL (ref 13.0–17.0)
Lymphocytes Relative: 16.7 % (ref 12.0–46.0)
Lymphs Abs: 1.5 10*3/uL (ref 0.7–4.0)
MCHC: 34 g/dL (ref 30.0–36.0)
MCV: 94.9 fl (ref 78.0–100.0)
Monocytes Absolute: 0.6 10*3/uL (ref 0.1–1.0)
Monocytes Relative: 7.1 % (ref 3.0–12.0)
Neutro Abs: 6.6 10*3/uL (ref 1.4–7.7)
Neutrophils Relative %: 74.2 % (ref 43.0–77.0)
Platelets: 223 10*3/uL (ref 150.0–400.0)
RBC: 4.61 Mil/uL (ref 4.22–5.81)
RDW: 13.7 % (ref 11.5–15.5)
WBC: 8.9 10*3/uL (ref 4.0–10.5)

## 2019-08-30 LAB — TSH: TSH: 1.84 u[IU]/mL (ref 0.35–4.50)
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
## 2019-08-30 NOTE — Patient Instructions (Signed)
Continue checking your blood pressures BP GOAL is between 110/65 and  135/85. If it is consistently higher or lower, let me know   GO TO THE LAB : Get the blood work     GO TO THE FRONT DESK, PLEASE SCHEDULE YOUR APPOINTMENTS Come back for a checkup in 6 months 

## 2019-08-30 NOTE — Progress Notes (Signed)
Pre visit review using our clinic review tool, if applicable. No additional management support is needed unless otherwise documented below in the visit note. 

## 2019-08-31 NOTE — Assessment & Plan Note (Signed)
--  Td 03/2019 - pnm 23 01-2015 -  prevnar 2018   - s/p covid vaccines ---CCS:  2006  Cscope Dr Vladimir Faster Normal , reports had another colonoscopy in 2009 with Dr. Paulita Fujita, unable to get records, reportedly normal; due for a Cscope, reports he will call when ready, very busy at work this time of the year. ---Prostate ca screening: on HRT, DRE PSA wnl 06/2019 --Lifestyle:  Very active, doing well ---Tobacco: Still smoking, seriously thinking about quitting, has taken some steps.  Counseled. -- Labs: CMP, FLP, CBC, TSH

## 2019-08-31 NOTE — Assessment & Plan Note (Signed)
Here for CPX HTN: On low-dose metoprolol, reports ambulatory BP normal Hyperlipidemia: Diet controlled, checking labs Hypogonadism: On HRT, last level normal.  No change, reassess in 6 months NSAIDs: Taking OTCs and also have meloxicam, recommend not to mix them.  He understands. RTC 6 months

## 2019-08-31 NOTE — Assessment & Plan Note (Deleted)
Here for CPX HTN: On low-dose metoprolol, reports ambulatory BP normal Hyperlipidemia: Diet controlled, checking labs Hypogonadism: On HRT, last level normal.  No change, reassess in 6 months NSAIDs: Taking OTCs and also have meloxicam, recommend not to mix them.  He understands. RTC 6 months

## 2019-09-01 ENCOUNTER — Telehealth: Payer: Self-pay | Admitting: Internal Medicine

## 2019-09-01 MED ORDER — ATORVASTATIN CALCIUM 10 MG PO TABS: 10.0000 mg | ORAL_TABLET | Freq: Every day | ORAL | 3 refills | Status: DC

## 2019-09-01 NOTE — Telephone Encounter (Signed)
Spoke w/ Pt- informed of results and recommendations. Lab appt scheduled for 10/20/2019.

## 2019-09-01 NOTE — Telephone Encounter (Signed)
Result Communications  Result Notes  Damita Dunnings, [facility]  09/01/2019  3:05 PM EDT    [facility] w/ detailed message regarding lab results and recommendations. Instructed Pt to call office to schedule lab appt in 6 weeks. Atorvastatin 10mg  sent to [facility]. AST, ALT, FLP ordered.    Colon Branch, MD  09/01/2019 12:35 PM EDT    The 10-year ASCVD risk score Mikey Bussing DC Brooke Bonito., et al., 2013) is: 22.7% Please call patient: Labs are okay except his cholesterol continue to be moderately elevated. Recommend atorvastatin 10 mg (low-dose) 1 p.o. nightly #30 and 3 refills Arrange for FLP, AST, ALT in 6 weeks.

## 2019-09-01 NOTE — Addendum Note (Signed)
Addended byDamita Dunnings D on: 09/01/2019 03:04 PM   Modules accepted: Orders

## 2019-09-01 NOTE — Telephone Encounter (Signed)
Pt states he received a message from [facility] in regards to his labs. He has some question and would like for kaylyn to call him back at [phone].

## 2019-09-13 ENCOUNTER — Telehealth: Payer: Self-pay

## 2019-09-13 NOTE — Telephone Encounter (Signed)
Pt called- requesting lab work to be mailed to him. Will do.

## 2019-09-29 ENCOUNTER — Other Ambulatory Visit: Payer: Self-pay | Admitting: Internal Medicine

## 2019-09-29 NOTE — Telephone Encounter (Signed)
Prescription sent

## 2019-09-29 NOTE — Telephone Encounter (Signed)
Testosterone refill.   Last OV: 08/30/2019 Last Fill:04/18/2019 #75g and 5RF

## 2019-10-06 ENCOUNTER — Telehealth: Payer: Self-pay | Admitting: Internal Medicine

## 2019-10-06 MED ORDER — PRAVASTATIN SODIUM 10 MG PO TABS: 10.0000 mg | ORAL_TABLET | Freq: Every day | ORAL | 1 refills | Status: DC

## 2019-10-06 NOTE — Telephone Encounter (Signed)
Advise patient, -Stop Lipitor, aches are possibly a side effect. -In 2 to 3 weeks start pravastatin 10 mg nightly, send a prescription #30 and 1 refill. - in a month after he starts pravastatin, patient is to call the office, let us know how he is doing and if he is doing okay will recommend labs.

## 2019-10-06 NOTE — Telephone Encounter (Signed)
Please advise 

## 2019-10-06 NOTE — Telephone Encounter (Signed)
Spoke w/ Pt- informed of recommendations. Pt verbalized understanding. Rx for pravastatin sent to [facility]- Pt aware not to start for 2-3 weeks. He will call in 1 month to let us know how he is doing.

## 2019-10-06 NOTE — Telephone Encounter (Signed)
Patient states that since he starting taking the atorvastatin (LIPITOR) 10 MG tablet   he's been feeling weak in the arms and body aches... He wants to know if he should continue taking it? He said he's even tried taking half. And it causing a lot of pain. Please advise. He stated that it is okay to leave a detailed message if he dont answer.

## 2019-10-19 ENCOUNTER — Other Ambulatory Visit: Payer: Self-pay | Admitting: Internal Medicine

## 2019-10-20 ENCOUNTER — Other Ambulatory Visit: Payer: 59

## 2019-11-02 ENCOUNTER — Telehealth: Payer: Self-pay | Admitting: Internal Medicine

## 2019-11-02 NOTE — Telephone Encounter (Signed)
Please advise 

## 2019-11-02 NOTE — Telephone Encounter (Signed)
Caller: [name] Call back number: [phone]  Patient states Pravastatin is giving him some side affect (muscle pain, dark-urine). Pharmacist suggests patient could either cut pills in half or take it every couple of days. Patient wants to know your input.

## 2019-11-02 NOTE — Telephone Encounter (Signed)
Recommend to stop pravastatin for 2 weeks, then restart. If symptoms reappear then he needs to let me know, we will need to check his blood and urine. If he has severe symptoms now, we will have to do labs today.

## 2019-11-03 NOTE — Telephone Encounter (Signed)
Notified pt and he voices understanding. He states these are the same symptoms he has gotten with previous cholesterol medications he has tried and feels like they will return but states he will follow PCP recommendations as below and let us know if symptoms return.

## 2020-02-01 ENCOUNTER — Telehealth: Payer: Self-pay | Admitting: Internal Medicine

## 2020-02-01 NOTE — Telephone Encounter (Signed)
Already received refill request from pharmacy. Has been sent to PCP to refill.

## 2020-02-01 NOTE — Telephone Encounter (Signed)
Requesting: testosterone 1.62% gel Contract: None UDS: None Last Visit: 08/30/2019 Next Visit: 03/01/2020 Last Refill:09/29/2019 #75g and 5RF  Please Advise

## 2020-02-01 NOTE — Telephone Encounter (Signed)
Prescription sent

## 2020-02-01 NOTE — Telephone Encounter (Signed)
Pt has appointment on 03/01/20  Medication: Testosterone 1.62 % GEL [[id number]]      Has the patient contacted their pharmacy?  (If no, request that the patient contact the pharmacy for the refill.) (If yes, when and what did the pharmacy advise?)     Preferred Pharmacy (with phone number or street name): [id number] - [place], [place] - [address]  [address] [place], [place] [address]  Phone:  [phone] Fax:  [phone]      Agent: Please be advised that RX refills may take up to 3 business days. We ask that you follow-up with your pharmacy.

## 2020-03-01 ENCOUNTER — Ambulatory Visit (INDEPENDENT_AMBULATORY_CARE_PROVIDER_SITE_OTHER): Payer: 59 | Admitting: Internal Medicine

## 2020-03-01 ENCOUNTER — Encounter: Payer: Self-pay | Admitting: Internal Medicine

## 2020-03-01 ENCOUNTER — Other Ambulatory Visit: Payer: Self-pay

## 2020-03-01 VITALS — BP 126/79 | HR 66 | Temp 98.2°F | Resp 16 | Ht 71.0 in | Wt 168.1 lb

## 2020-03-01 DIAGNOSIS — I10 Essential (primary) hypertension: Secondary | ICD-10-CM | POA: Diagnosis not present

## 2020-03-01 DIAGNOSIS — Z23 Encounter for immunization: Secondary | ICD-10-CM | POA: Diagnosis not present

## 2020-03-01 DIAGNOSIS — E785 Hyperlipidemia, unspecified: Secondary | ICD-10-CM

## 2020-03-01 DIAGNOSIS — E291 Testicular hypofunction: Secondary | ICD-10-CM

## 2020-03-01 MED ORDER — SHINGRIX 50 MCG/0.5ML IM SUSR: 0.5000 mL | Freq: Once | INTRAMUSCULAR | 1 refills | Status: AC
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
## 2020-03-01 NOTE — Patient Instructions (Signed)
Check the  blood pressure  BP GOAL is between 110/65 and  135/85. If it is consistently higher or lower, let me know  Proceed with your shingles vaccination (is 2 shots separated by 2 months.)  GO TO THE LAB : Get the blood work     [place], Cataract back for   checkup in 4 months

## 2020-03-01 NOTE — Progress Notes (Signed)
Pre visit review using our clinic review tool, if applicable. No additional management support is needed unless otherwise documented below in the visit note. 

## 2020-03-02 LAB — TESTOSTERONE TOTAL,FREE,BIO, MALES
Albumin: 4.1 g/dL (ref 3.6–5.1)
Sex Hormone Binding: 66 nmol/L (ref 22–77)
Testosterone, Bioavailable: 121 ng/dL (ref >=110.0)
Testosterone, Free: 64.3 pg/mL (ref 46.0–224.0)
Testosterone: 815 ng/dL (ref 250–827)

## 2020-03-02 NOTE — Assessment & Plan Note (Signed)
HTN: Continue metoprolol, normal ambulatory BPs Hyperlipidemia: Definitely intolerant to Lipitor, pravastatin due to aches.  Discussed Zetia Hypogonadism: Takes testosterone 2 or 2.5 pumps daily, checking levels. Tobacco abuse: He does qualify to CT lung cancer screening, he is not ready, reassess on RTC Preventive care:  Flu shot today Plans to get Covid booster. Discussed Shingrix, prescription printed Due for a  colonoscopy, he prefers to wait few months.  Reassess on RTC RTC 4 months

## 2020-04-02 ENCOUNTER — Telehealth: Payer: Self-pay

## 2020-04-02 MED ORDER — TESTOSTERONE 1.62 % TD GEL: 3.0000 | Freq: Every day | TRANSDERMAL | 1 refills | Status: DC

## 2020-04-02 NOTE — Telephone Encounter (Signed)
PDMP okay, he previously said he took 2-1/2 pumps qd  but will send 3 pump daily prescription.

## 2020-04-02 NOTE — Telephone Encounter (Signed)
Requesting: testosterone Contract: n/a UDS:n/a Last Visit: 03/01/2020 Next Visit: 06/28/2020 Last Refill: 02/01/2020 #150g and 1RF Pt sig: 3 pumps daily  Please Advise

## 2020-04-02 NOTE — Telephone Encounter (Signed)
Pt called requesting a refill on Testosterone 1.62 % GEL.  Pharmacy is [facility] [address].

## 2020-04-26 ENCOUNTER — Telehealth: Payer: Self-pay | Admitting: Internal Medicine

## 2020-04-26 NOTE — Telephone Encounter (Addendum)
1 refill already on file from 04/02/2020.  I tried calling [facility] 3 times to confirm but they are having issues with their phone.

## 2020-04-26 NOTE — Telephone Encounter (Signed)
Medication:  Testosterone 1.62 % GEL   Has the patient contacted their pharmacy? No. (If no, request that the patient contact the pharmacy for the refill.) (If yes, when and what did the pharmacy advise?)  Preferred Pharmacy (with phone number or street name): [id number] - [place], [place] - [address] AT [facility] [address]  [address], [place] [address]  Phone:  [phone] Fax:  [phone]   Agent: Please be advised that RX refills may take up to 3 business days. We ask that you follow-up with your pharmacy.

## 2020-06-07 ENCOUNTER — Telehealth: Payer: Self-pay | Admitting: Internal Medicine

## 2020-06-07 ENCOUNTER — Telehealth: Payer: Self-pay

## 2020-06-07 MED ORDER — TESTOSTERONE 1.62 % TD GEL: 3.0000 | Freq: Every day | TRANSDERMAL | 3 refills | Status: DC

## 2020-06-07 NOTE — Telephone Encounter (Signed)
PA approved. Effective 06/07/2020 to 06/06/2021.

## 2020-06-07 NOTE — Telephone Encounter (Signed)
PA initiated via Covermymeds; KEY: [id number]. Awaiting determination.

## 2020-06-07 NOTE — Telephone Encounter (Signed)
Requesting: testosterone 1.62% Contract: n/a UDS: n/a Last Visit: 03/01/2020 Next Visit: 06/28/2020 Last Refill: 04/02/2020 #150g and 1RF  Please Advise

## 2020-06-07 NOTE — Telephone Encounter (Signed)
Medication:  Testosterone 1.62 % GEL [[id number]]      Has the patient contacted their pharmacy?  (If no, request that the patient contact the pharmacy for the refill.) (If yes, when and what did the pharmacy advise?)     Preferred Pharmacy (with phone number or street name):   [id number] - [place], [place] - [address]  [address] [place], [place] [address]  Phone:  [phone] Fax:  [phone]  DEA #:  [id number]  Agent: Please be advised that RX refills may take up to 3 business days. We ask that you follow-up with your pharmacy.

## 2020-06-07 NOTE — Telephone Encounter (Signed)
Prescription sent

## 2020-06-11 ENCOUNTER — Telehealth: Payer: Self-pay | Admitting: Internal Medicine

## 2020-06-11 DIAGNOSIS — K30 Functional dyspepsia: Secondary | ICD-10-CM

## 2020-06-11 DIAGNOSIS — Z1211 Encounter for screening for malignant neoplasm of colon: Secondary | ICD-10-CM

## 2020-06-11 NOTE — Telephone Encounter (Signed)
Patient states he would like a referral to Gastroenology for a Colonoscopy.   Patient states he would like to [facility] , patient states burning sensation in stomach.  Call Back @ [phone] Call Back @ [phone]

## 2020-06-11 NOTE — Telephone Encounter (Signed)
Referral placed.

## 2020-06-19 ENCOUNTER — Ambulatory Visit: Payer: 59 | Admitting: Gastroenterology

## 2020-06-28 ENCOUNTER — Other Ambulatory Visit: Payer: Self-pay

## 2020-06-28 ENCOUNTER — Encounter: Payer: Self-pay | Admitting: Internal Medicine

## 2020-06-28 ENCOUNTER — Ambulatory Visit (INDEPENDENT_AMBULATORY_CARE_PROVIDER_SITE_OTHER): Payer: 59 | Admitting: Internal Medicine

## 2020-06-28 VITALS — BP 124/68 | HR 67 | Temp 97.8°F | Resp 16 | Ht 71.0 in | Wt 163.5 lb

## 2020-06-28 DIAGNOSIS — E291 Testicular hypofunction: Secondary | ICD-10-CM

## 2020-06-28 DIAGNOSIS — E785 Hyperlipidemia, unspecified: Secondary | ICD-10-CM | POA: Diagnosis not present

## 2020-06-28 LAB — CBC WITH DIFFERENTIAL/PLATELET
Basophils Absolute: 0 10*3/uL (ref 0.0–0.1)
Basophils Relative: 0.5 % (ref 0.0–3.0)
Eosinophils Absolute: 0.1 10*3/uL (ref 0.0–0.7)
Eosinophils Relative: 1.2 % (ref 0.0–5.0)
HCT: 44.2 % (ref 39.0–52.0)
Hemoglobin: 15 g/dL (ref 13.0–17.0)
Lymphocytes Relative: 17.6 % (ref 12.0–46.0)
Lymphs Abs: 1.6 10*3/uL (ref 0.7–4.0)
MCHC: 33.9 g/dL (ref 30.0–36.0)
MCV: 94.2 fl (ref 78.0–100.0)
Monocytes Absolute: 0.8 10*3/uL (ref 0.1–1.0)
Monocytes Relative: 9.3 % (ref 3.0–12.0)
Neutro Abs: 6.5 10*3/uL (ref 1.4–7.7)
Neutrophils Relative %: 71.4 % (ref 43.0–77.0)
Platelets: 207 10*3/uL (ref 150.0–400.0)
RBC: 4.7 Mil/uL (ref 4.22–5.81)
RDW: 13.2 % (ref 11.5–15.5)
WBC: 9.1 10*3/uL (ref 4.0–10.5)

## 2020-06-28 LAB — TESTOSTERONE: Testosterone: 505.03 ng/dL (ref 300.00–890.00)

## 2020-06-28 LAB — PSA: PSA: 0.11 ng/mL (ref 0.10–4.00)

## 2020-06-28 MED ORDER — EZETIMIBE 10 MG PO TABS: 10.0000 mg | ORAL_TABLET | Freq: Every day | ORAL | 1 refills | Status: DC
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
## 2020-06-28 NOTE — Patient Instructions (Addendum)
I sent a prescription for Zetia also known as ezetimibe 10 mg.  Take 1 tablet daily    GO TO THE LAB : Get the blood work     [place], [place] Come back for   a physical exam by may 2022

## 2020-06-29 NOTE — Assessment & Plan Note (Signed)
Hyperlipidemia: Intolerant to statins, recommend Zetia, he agreed.  Recheck FLP on RTC Hypogonadism: On testosterone supplements, check CBC, PSA and testosterone level. Preventive care: Colonoscopy pending. RTC CPX 08/2020

## 2020-07-05 ENCOUNTER — Ambulatory Visit: Payer: 59 | Admitting: Gastroenterology

## 2020-07-26 ENCOUNTER — Ambulatory Visit: Payer: 59 | Admitting: Gastroenterology

## 2020-08-14 ENCOUNTER — Other Ambulatory Visit: Payer: Self-pay

## 2020-08-14 ENCOUNTER — Ambulatory Visit (AMBULATORY_SURGERY_CENTER): Payer: Self-pay

## 2020-08-14 VITALS — Ht 71.0 in | Wt 168.0 lb

## 2020-08-14 DIAGNOSIS — Z1211 Encounter for screening for malignant neoplasm of colon: Secondary | ICD-10-CM

## 2020-08-14 MED ORDER — NA SULFATE-K SULFATE-MG SULF 17.5-3.13-1.6 GM/177ML PO SOLN: 1.0000 | Freq: Once | ORAL | 0 refills | Status: AC

## 2020-08-14 NOTE — Progress Notes (Signed)
No allergies to soy or egg Pt is not on blood thinners or diet pills Denies issues with sedation/intubation Denies atrial flutter/fib Denies constipation   Emmi instructions given to pt  Pt is aware of Covid safety and care partner requirements.  

## 2020-08-30 ENCOUNTER — Encounter: Payer: Self-pay | Admitting: Internal Medicine

## 2020-08-30 ENCOUNTER — Other Ambulatory Visit: Payer: Self-pay

## 2020-08-30 ENCOUNTER — Ambulatory Visit (AMBULATORY_SURGERY_CENTER): Payer: 59 | Admitting: Internal Medicine

## 2020-08-30 VITALS — BP 102/66 | HR 67 | Temp 98.0°F | Resp 11 | Ht 71.0 in | Wt 168.0 lb

## 2020-08-30 DIAGNOSIS — D122 Benign neoplasm of ascending colon: Secondary | ICD-10-CM

## 2020-08-30 DIAGNOSIS — D123 Benign neoplasm of transverse colon: Secondary | ICD-10-CM | POA: Diagnosis not present

## 2020-08-30 DIAGNOSIS — Z1211 Encounter for screening for malignant neoplasm of colon: Secondary | ICD-10-CM | POA: Diagnosis not present

## 2020-08-30 MED ORDER — SODIUM CHLORIDE 0.9 % IV SOLN: 500.0000 mL | Freq: Once | INTRAVENOUS | Status: DC

## 2020-08-30 NOTE — Op Note (Addendum)
[facility] Patient Name: Ricardo Fisher Procedure Date: 08/30/2020 1:48 PM MRN: [id number] Endoscopist: Gatha Mayer , MD Age: 65 Referring MD:  Date of Birth: 14-Mar-1956 Gender: Male Account #: [id number] Procedure:                Colonoscopy Indications:              Screening for colorectal malignant neoplasm Medicines:                Propofol per Anesthesia, Monitored Anesthesia Care Procedure:                Pre-Anesthesia Assessment:                           - Prior to the procedure, a History and Physical                            was performed, and patient medications and                            allergies were reviewed. The patient's tolerance of                            previous anesthesia was also reviewed. The risks                            and benefits of the procedure and the sedation                            options and risks were discussed with the patient.                            All questions were answered, and informed consent                            was obtained. Prior Anticoagulants: The patient has                            taken no previous anticoagulant or antiplatelet                            agents. ASA Grade Assessment: II - A patient with                            mild systemic disease. After reviewing the risks                            and benefits, the patient was deemed in                            satisfactory condition to undergo the procedure.                           After obtaining informed consent, the colonoscope  was passed under direct vision. Throughout the                            procedure, the patient's blood pressure, pulse, and                            oxygen saturations were monitored continuously. The                            Olympus CF-HQ190L [id number]) Colonoscope was                            introduced through the anus and advanced to the the                             cecum, identified by appendiceal orifice and                            ileocecal valve. The colonoscopy was performed                            without difficulty. The patient tolerated the                            procedure well. The quality of the bowel                            preparation was excellent. The ileocecal valve,                            appendiceal orifice, and rectum were photographed.                            The bowel preparation used was SUPREP via split                            dose instruction. Scope In: 2:01:20 PM Scope Out: 2:14:40 PM Scope Withdrawal Time: 0 hours 10 minutes 42 seconds  Total Procedure Duration: 0 hours 13 minutes 20 seconds  Findings:                 The perianal and digital rectal examinations were                            normal.                           Four sessile polyps were found in the transverse                            colon and ascending colon. The polyps were                            diminutive in size. These polyps were removed with  a cold snare. Resection and retrieval were                            complete. Verification of patient identification                            for the specimen was done. Estimated blood loss was                            minimal.                           The exam was otherwise without abnormality on                            direct and retroflexion views. Complications:            No immediate complications. Estimated Blood Loss:     Estimated blood loss was minimal. Impression:               - Four diminutive polyps in the transverse colon                            and in the ascending colon, removed with a cold                            snare. Resected and retrieved.                           - The examination was otherwise normal on direct                            and retroflexion views. Recommendation:           - Patient has a contact  number available for                            emergencies. The signs and symptoms of potential                            delayed complications were discussed with the                            patient. Return to normal activities tomorrow.                            Written discharge instructions were provided to the                            patient.                           - Resume previous diet.                           - Continue present medications.                           -  Repeat colonoscopy is recommended. The                            colonoscopy date will be determined after pathology                            results from today's exam become available for                            review. Father had CRCA in 92's Gatha Mayer, MD 08/30/2020 2:21:06 PM This report has been signed electronically.

## 2020-08-30 NOTE — Progress Notes (Signed)
Called to room to assist during endoscopic procedure.  Patient ID and intended procedure confirmed with present staff. Received instructions for my participation in the procedure from the performing physician.  

## 2020-08-30 NOTE — Progress Notes (Signed)
Report to PACU, RN, vss, BBS= Clear.  

## 2020-08-30 NOTE — Patient Instructions (Addendum)
I found and removed 4 tiny polyps that look benign. I am not concerned about these but they are off to pathology for analysis.  I will let you know pathology results and when to have another routine colonoscopy by mail and/or My Chart.  I appreciate the opportunity to care for you. Gatha Mayer, MD, [facility]  [place]yp handout given to patient.  YOU HAD AN ENDOSCOPIC PROCEDURE TODAY AT [facility]:   Refer to the procedure report that was given to you for any specific questions about what was found during the examination.  If the procedure report does not answer your questions, please call your gastroenterologist to clarify.  If you requested that your care partner not be given the details of your procedure findings, then the procedure report has been included in a sealed envelope for you to review at your convenience later.  YOU SHOULD EXPECT: Some feelings of bloating in the abdomen. Passage of more gas than usual.  Walking can help get rid of the air that was put into your GI tract during the procedure and reduce the bloating. If you had a lower endoscopy (such as a colonoscopy or flexible sigmoidoscopy) you may notice spotting of blood in your stool or on the toilet paper. If you underwent a bowel prep for your procedure, you may not have a normal bowel movement for a few days.  Please Note:  You might notice some irritation and congestion in your nose or some drainage.  This is from the oxygen used during your procedure.  There is no need for concern and it should clear up in a day or so.  SYMPTOMS TO REPORT IMMEDIATELY:   Following lower endoscopy (colonoscopy or flexible sigmoidoscopy):  Excessive amounts of blood in the stool  Significant tenderness or worsening of abdominal pains  Swelling of the abdomen that is new, acute  Fever of 100F or higher For urgent or emergent issues, a gastroenterologist can be reached at any hour by calling [phone]. Do not use MyChart  messaging for urgent concerns.    DIET:  We do recommend a small meal at first, but then you may proceed to your regular diet.  Drink plenty of fluids but you should avoid alcoholic beverages for 24 hours.  ACTIVITY:  You should plan to take it easy for the rest of today and you should NOT DRIVE or use heavy machinery until tomorrow (because of the sedation medicines used during the test).    FOLLOW UP: Our staff will call the number listed on your records 48-72 hours following your procedure to check on you and address any questions or concerns that you may have regarding the information given to you following your procedure. If we do not reach you, we will leave a message.  We will attempt to reach you two times.  During this call, we will ask if you have developed any symptoms of COVID 19. If you develop any symptoms (ie: fever, flu-like symptoms, shortness of breath, cough etc.) before then, please call [phone].  If you test positive for Covid 19 in the 2 weeks post procedure, please call and report this information to [place].    If any biopsies were taken you will be contacted by phone or by letter within the next 1-3 weeks.  Please call us at [phone] if you have not heard about the biopsies in 3 weeks.    SIGNATURES/CONFIDENTIALITY: You and/or your care partner have signed paperwork which will be  entered into your electronic medical record.  These signatures attest to the fact that that the information above on your After Visit Summary has been reviewed and is understood.  Full responsibility of the confidentiality of this discharge information lies with you and/or your care-partner. 

## 2020-08-30 NOTE — Progress Notes (Signed)
Medical history reviewed with no changes noted. VS assessed by N.[facility] 

## 2020-08-31 ENCOUNTER — Telehealth: Payer: Self-pay | Admitting: Internal Medicine

## 2020-08-31 ENCOUNTER — Encounter: Payer: 59 | Admitting: Internal Medicine

## 2020-08-31 NOTE — Telephone Encounter (Signed)
PT was here yesterday for colonscopy he is on Methadone 9mg  daily currently the medication was entered incorrectly. This was corrected.

## 2020-08-31 NOTE — Telephone Encounter (Signed)
Patient called and said he noticed in My-Chart that a medication dosage was put into his file incorrectly from when he was being admitted yesterday to prep him for his procedure. Looking to get it corrected as soon as possible.

## 2020-09-03 ENCOUNTER — Telehealth: Payer: Self-pay

## 2020-09-03 NOTE — Telephone Encounter (Signed)
  Follow up Call-  Call back number 08/30/2020  Post procedure Call Back phone  # [phone]  Permission to leave phone message Yes  Some recent data might be hidden     Patient questions:  Do you have a fever, pain , or abdominal swelling? No. Pain Score  0 *  Have you tolerated food without any problems? Yes.    Have you been able to return to your normal activities? Yes.    Do you have any questions about your discharge instructions: Diet   No. Medications  No. Follow up visit  No.  Do you have questions or concerns about your Care? No.  Actions: * If pain score is 4 or above: No action needed, pain <4.  1. Have you developed a fever since your procedure? no  2.   Have you had an respiratory symptoms (SOB or cough) since your procedure? no  3.   Have you tested positive for COVID 19 since your procedure no  4.   Have you had any family members/close contacts diagnosed with the COVID 19 since your procedure?  no   If yes to any of these questions please route to Joylene John, RN and Joella Prince, RN

## 2020-09-12 ENCOUNTER — Encounter: Payer: Self-pay | Admitting: Internal Medicine

## 2020-09-13 ENCOUNTER — Other Ambulatory Visit: Payer: Self-pay

## 2020-09-13 ENCOUNTER — Ambulatory Visit (INDEPENDENT_AMBULATORY_CARE_PROVIDER_SITE_OTHER): Payer: 59 | Admitting: Internal Medicine

## 2020-09-13 ENCOUNTER — Encounter: Payer: Self-pay | Admitting: Internal Medicine

## 2020-09-13 VITALS — BP 151/91 | HR 69 | Temp 97.3°F | Ht 71.0 in | Wt 166.6 lb

## 2020-09-13 DIAGNOSIS — R739 Hyperglycemia, unspecified: Secondary | ICD-10-CM | POA: Diagnosis not present

## 2020-09-13 DIAGNOSIS — Z0000 Encounter for general adult medical examination without abnormal findings: Secondary | ICD-10-CM

## 2020-09-13 LAB — LIPID PANEL
Cholesterol: 164 mg/dL (ref 0–200)
HDL: 43.5 mg/dL (ref >39.00)
LDL Cholesterol: 103 mg/dL — ABNORMAL HIGH (ref 0–99)
NonHDL: 120.83
Total CHOL/HDL Ratio: 4
Triglycerides: 88 mg/dL (ref 0.0–149.0)
VLDL: 17.6 mg/dL (ref 0.0–40.0)

## 2020-09-13 LAB — COMPREHENSIVE METABOLIC PANEL
ALT: 11 U/L (ref 0–53)
AST: 20 U/L (ref 0–37)
Albumin: 4.2 g/dL (ref 3.5–5.2)
Alkaline Phosphatase: 48 U/L (ref 39–117)
BUN: 21 mg/dL (ref 6–23)
CO2: 33 mEq/L — ABNORMAL HIGH (ref 19–32)
Calcium: 9.2 mg/dL (ref 8.4–10.5)
Chloride: 100 mEq/L (ref 96–112)
Creatinine, Ser: 1.01 mg/dL (ref 0.40–1.50)
GFR: 78.37 mL/min (ref >60.00)
Glucose, Bld: 103 mg/dL — ABNORMAL HIGH (ref 70–99)
Potassium: 5 mEq/L (ref 3.5–5.1)
Sodium: 137 mEq/L (ref 135–145)
Total Bilirubin: 0.4 mg/dL (ref 0.2–1.2)
Total Protein: 7 g/dL (ref 6.0–8.3)

## 2020-09-13 LAB — HEMOGLOBIN A1C: Hgb A1c MFr Bld: 5.8 % (ref 4.6–6.5)

## 2020-09-13 NOTE — Assessment & Plan Note (Addendum)
Here for CPX HTN: BP today slightly elevated, is typically normal, on metoprolol.  No change Hyperlipidemia: Statin intolerant, on Zetia, checking labs.  No side effects. Hypogonadism: On HRT, last level satisfactory, continue with HRT, DRE today normal, recent PSA normal. Tobacco abuse: Still smoking on and off, counseled. Methadone: On methadone prescribed elsewhere(new information to me). RTC 4 months

## 2020-09-13 NOTE — Patient Instructions (Signed)
Check the  blood pressure regularly. BP GOAL is between 110/65 and  135/85. If it is consistently higher or lower, let me know  Please proceed with your COVID vaccination #4.  You can do it  downstairs   GO TO THE LAB : Get the blood work     [place], [place] back for for a checkup in 4 months    "Living will", "[place] of attorney": Advanced care planning  (If you already have a living will or healthcare power of attorney, please bring the copy to be scanned in your chart.)  Advance care planning is a process that supports adults in  understanding and sharing their preferences regarding future medical care.   The patient's preferences are recorded in documents called Advance Directives.    Advanced directives are completed (and can be modified at any time) while the patient is in full mental capacity.   The documentation should be available at all times to the patient, the family and the healthcare providers.  Bring in a copy to be scanned in your chart is an excellent idea and is recommended   This legal documents direct treatment decision making and/or appoint a surrogate to make the decision if the patient is not capable to do so.    Advance directives can be documented in many types of formats,  documents have names such as:  Lliving will  Durable power of attorney for healthcare (healthcare proxy or healthcare power of attorney)  Combined directives  Physician orders for life-sustaining treatment    More information at:  [email or web address]
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
## 2020-09-13 NOTE — Assessment & Plan Note (Signed)
--  Td 03/2019 - pnm 23 01-2015 -  prevnar 2018  - shingrix d/w pt , planning to get  -  rec covid vaccine #4 ---CCS: 2006 Cscope Dr Vladimir Faster Normal , reports had another colonoscopy in 2009 with Dr. Paulita Fujita, unable to get records, reportedly normal; s/p  Cscope 08-30-20 Dr Carlean Purl --Prostate ca screening: on HRT,recent PSA normal, DRE today normal. - Lung cancer screening: pro-cons d/w pt,  will think about it --Lifestyle:very active, diet ok -Labs: cmp flp a1c - POA d/w pt

## 2020-09-26 ENCOUNTER — Telehealth: Payer: Self-pay | Admitting: Internal Medicine

## 2020-09-26 MED ORDER — EZETIMIBE 10 MG PO TABS: 10.0000 mg | ORAL_TABLET | Freq: Every day | ORAL | 1 refills | Status: DC

## 2020-09-26 NOTE — Telephone Encounter (Signed)
Requesting: testosterone 1.62% gel Contract: n/a UDS: n/a Last Visit: 09/13/2020 Next Visit: 01/17/2021 Last Refill: 06/07/2020 #150g and 3rf  Please Advise

## 2020-09-26 NOTE — Telephone Encounter (Signed)
Medication: ezetimibe (ZETIA) 10 MG tablet [[id number]]    Testosterone 1.62 % GEL [[id number]]   metoprolol succinate (TOPROL-XL) 25 MG 24 hr tablet [[id number]   Has the patient contacted their pharmacy? no (If no, request that the patient contact the pharmacy for the refill.) (If yes, when and what did the pharmacy advise?)    Preferred Pharmacy (with phone number or street name):  [id number] - [place], [place] - [address]  [address] [place], [place] [address]  Phone:  [phone] Fax:  [phone]     Agent: Please be advised that RX refills may take up to 3 business days. We ask that you follow-up with your pharmacy.

## 2020-09-26 NOTE — Telephone Encounter (Signed)
PDMP okay, Rx sent 

## 2020-09-26 NOTE — Telephone Encounter (Signed)
Received refill request for metoprolol and testosterone earlier from pharmacy. Zetia Rx sent.

## 2020-10-01 ENCOUNTER — Telehealth: Payer: Self-pay | Admitting: Internal Medicine

## 2020-10-01 NOTE — Telephone Encounter (Signed)
PA approved through 10/01/2021.

## 2020-10-01 NOTE — Telephone Encounter (Signed)
Patient call back # [phone]  Patient states he got a call from [employer] in regards to testosterone gel. He states they have fax a form that needs to be fill asap.     [employer] medical claim phone [phone]

## 2020-10-01 NOTE — Telephone Encounter (Signed)
Ins called and needed to answer questions about prior auth.  Questions answered (we had already faxed them back).  Ref #[id number]

## 2020-10-01 NOTE — Telephone Encounter (Signed)
PA initiated via Covermymeds; KEY: [id number]. Awaiting determination.

## 2020-10-03 NOTE — Telephone Encounter (Signed)
Call back number [phone] option 3  Ref # [id number]

## 2020-10-03 NOTE — Telephone Encounter (Signed)
Complete number not given- can you check your phone call records for me please?

## 2020-10-03 NOTE — Telephone Encounter (Signed)
Ricardo Fisher ([employer]) Call back number: [phone] option 3 Ref # [id number]  Ricardo Fisher states that they have further questions.

## 2020-10-03 NOTE — Telephone Encounter (Signed)
PA denied for Tier exception.

## 2020-10-03 NOTE — Telephone Encounter (Signed)
Pt requested a Tier Exception d/t price- spoke w/ [employer]- answered further questions. Pending determination for tier exception.

## 2020-10-05 NOTE — Telephone Encounter (Signed)
The patient states he appeals the decision and they will send you another form. He would like to thank you for your time.

## 2020-10-08 NOTE — Telephone Encounter (Signed)
Caller: Lennette Bihari [facility]) Call back # [phone]   Lennette Bihari states the patient filed an appeal and they needed to know if:  1-Have patients tried and failed testestore injections?  If the answer to question 1 is no, they will need a clinical reason for using gel rather than injection.

## 2020-10-08 NOTE — Telephone Encounter (Signed)
LMOM for Ricardo Fisher- w/ answers to questions for Tier exception. Instructed to call if questions/concerns.

## 2020-10-08 NOTE — Telephone Encounter (Signed)
Do not know if he try injections before. He has tried a number of different testosterone gels and the one I am prescribing is the one that works best for him.  Recommend not to change

## 2020-10-08 NOTE — Telephone Encounter (Signed)
Please advise 

## 2020-10-09 NOTE — Telephone Encounter (Signed)
Re-determination denied.

## 2020-11-08 ENCOUNTER — Telehealth: Payer: Self-pay

## 2020-11-08 ENCOUNTER — Telehealth: Payer: Self-pay | Admitting: Internal Medicine

## 2020-11-08 NOTE — Telephone Encounter (Signed)
Pt called requesting we send in 2 bottles of testosterone at a time instead of 1- as it would be cheaper for him, says that 75g that we send monthly is actually a 20 day supply instead of 30.

## 2020-11-09 MED ORDER — TESTOSTERONE 1.62 % TD GEL: TRANSDERMAL | 5 refills | Status: DC

## 2020-11-09 NOTE — Telephone Encounter (Signed)
LMOM informing Pt that Rx resent for 2 bottles.

## 2020-11-09 NOTE — Telephone Encounter (Signed)
Sent a new prescription

## 2020-12-26 ENCOUNTER — Other Ambulatory Visit (HOSPITAL_BASED_OUTPATIENT_CLINIC_OR_DEPARTMENT_OTHER): Payer: Self-pay

## 2020-12-26 ENCOUNTER — Telehealth: Payer: Self-pay

## 2020-12-26 MED ORDER — TESTOSTERONE 1.62 % TD GEL
TRANSDERMAL | 5 refills | Status: DC
  Filled 2020-12-26 – 2021-03-22 (×2): qty 150, 40d supply, fill #0
  Filled 2021-05-06: qty 150, 40d supply, fill #1
  Filled 2021-06-14: qty 150, 40d supply, fill #2

## 2020-12-26 NOTE — Telephone Encounter (Signed)
Can you resend testosterone prescription downstairs please?

## 2020-12-26 NOTE — Telephone Encounter (Signed)
Pt called stating he had been calling various pharmacies trying to find the generic testosterone (amneal) that he can take and he found it downstairs in the pharmacy.  Please send his prescription down there and any future refills to them as well for this prescription.

## 2020-12-26 NOTE — Telephone Encounter (Signed)
sent 

## 2021-01-01 ENCOUNTER — Encounter: Payer: Self-pay | Admitting: Internal Medicine

## 2021-01-01 ENCOUNTER — Other Ambulatory Visit: Payer: Self-pay

## 2021-01-01 ENCOUNTER — Ambulatory Visit (INDEPENDENT_AMBULATORY_CARE_PROVIDER_SITE_OTHER): Payer: PPO | Admitting: Internal Medicine

## 2021-01-01 VITALS — BP 122/70 | HR 76 | Temp 97.5°F | Resp 18 | Ht 71.0 in | Wt 161.0 lb

## 2021-01-01 DIAGNOSIS — F419 Anxiety disorder, unspecified: Secondary | ICD-10-CM

## 2021-01-01 DIAGNOSIS — F4321 Adjustment disorder with depressed mood: Secondary | ICD-10-CM | POA: Diagnosis not present

## 2021-01-01 DIAGNOSIS — I10 Essential (primary) hypertension: Secondary | ICD-10-CM | POA: Diagnosis not present

## 2021-01-01 DIAGNOSIS — E785 Hyperlipidemia, unspecified: Secondary | ICD-10-CM | POA: Diagnosis not present
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
## 2021-01-01 NOTE — Assessment & Plan Note (Signed)
HTN: Well-controlled, continue metoprolol. Hyperlipidemia: Well-controlled on Zetia Anxiety: Recently lost his girlfriend unexpectedly, obviously affected, listening therapy provided.  No suicidal ideas.  If pharmacological therapy is needed, recommend to see Dr. Reece Levy (he said he already reach out to him to let him know).  Also strongly encourage counseling, list of providers provided. Vaccinations including Shingrix, COVID and influenza discussed. RTC 4 months

## 2021-01-01 NOTE — Patient Instructions (Signed)
Think about seeing a counselor  [place] back for   a checkup in 4 to 5 months

## 2021-01-02 ENCOUNTER — Encounter: Payer: Self-pay | Admitting: Internal Medicine

## 2021-01-17 ENCOUNTER — Ambulatory Visit: Payer: 59 | Admitting: Internal Medicine

## 2021-02-18 ENCOUNTER — Other Ambulatory Visit (HOSPITAL_BASED_OUTPATIENT_CLINIC_OR_DEPARTMENT_OTHER): Payer: Self-pay

## 2021-03-07 ENCOUNTER — Other Ambulatory Visit (HOSPITAL_BASED_OUTPATIENT_CLINIC_OR_DEPARTMENT_OTHER): Payer: Self-pay

## 2021-03-07 MED ORDER — INFLUENZA VAC A&B SA ADJ QUAD 0.5 ML IM PRSY
PREFILLED_SYRINGE | INTRAMUSCULAR | 0 refills | Status: DC
  Filled 2021-03-07: qty 0.5, 1d supply, fill #0

## 2021-03-22 ENCOUNTER — Other Ambulatory Visit (HOSPITAL_BASED_OUTPATIENT_CLINIC_OR_DEPARTMENT_OTHER): Payer: Self-pay

## 2021-04-04 ENCOUNTER — Other Ambulatory Visit (HOSPITAL_BASED_OUTPATIENT_CLINIC_OR_DEPARTMENT_OTHER): Payer: Self-pay

## 2021-04-04 ENCOUNTER — Telehealth: Payer: Self-pay | Admitting: Internal Medicine

## 2021-04-04 MED ORDER — METOPROLOL SUCCINATE ER 25 MG PO TB24
12.5000 mg | ORAL_TABLET | Freq: Every day | ORAL | 1 refills | Status: DC
  Filled 2021-04-04: qty 45, 90d supply, fill #0
  Filled 2021-07-26: qty 45, 90d supply, fill #1

## 2021-04-04 MED ORDER — EZETIMIBE 10 MG PO TABS
10.0000 mg | ORAL_TABLET | Freq: Every day | ORAL | 1 refills | Status: DC
  Filled 2021-04-04: qty 90, 90d supply, fill #0
  Filled 2021-11-22: qty 90, 90d supply, fill #1

## 2021-04-04 NOTE — Telephone Encounter (Signed)
Medication:  1.metoprolol succinate (TOPROL-XL) 25 MG 24 hr tablet 2.ezetimibe (ZETIA) 10 MG tablet  Has the patient contacted their pharmacy? Yes.   (If no, request that the patient contact the pharmacy for the refill.) (If yes, when and what did the pharmacy advise?)  Preferred Pharmacy (with phone number or street name):  [facility]  [address] [place], [place] [address]  Phone:  [phone]  Fax:  [phone]   Agent: Please be advised that RX refills may take up to 3 business days. We ask that you follow-up with your pharmacy.

## 2021-04-04 NOTE — Telephone Encounter (Signed)
Rxs sent

## 2021-05-01 DIAGNOSIS — F1120 Opioid dependence, uncomplicated: Secondary | ICD-10-CM | POA: Diagnosis not present

## 2021-05-02 DIAGNOSIS — F1120 Opioid dependence, uncomplicated: Secondary | ICD-10-CM | POA: Diagnosis not present

## 2021-05-03 DIAGNOSIS — F1120 Opioid dependence, uncomplicated: Secondary | ICD-10-CM | POA: Diagnosis not present

## 2021-05-06 ENCOUNTER — Other Ambulatory Visit (HOSPITAL_BASED_OUTPATIENT_CLINIC_OR_DEPARTMENT_OTHER): Payer: Self-pay

## 2021-05-09 DIAGNOSIS — F1120 Opioid dependence, uncomplicated: Secondary | ICD-10-CM | POA: Diagnosis not present

## 2021-05-14 ENCOUNTER — Ambulatory Visit (INDEPENDENT_AMBULATORY_CARE_PROVIDER_SITE_OTHER): Payer: PPO | Admitting: Internal Medicine

## 2021-05-14 ENCOUNTER — Encounter: Payer: Self-pay | Admitting: Internal Medicine

## 2021-05-14 VITALS — BP 126/68 | HR 70 | Temp 97.5°F | Resp 16 | Ht 71.0 in | Wt 163.1 lb

## 2021-05-14 DIAGNOSIS — E785 Hyperlipidemia, unspecified: Secondary | ICD-10-CM

## 2021-05-14 DIAGNOSIS — F419 Anxiety disorder, unspecified: Secondary | ICD-10-CM

## 2021-05-14 DIAGNOSIS — E291 Testicular hypofunction: Secondary | ICD-10-CM

## 2021-05-14 LAB — TESTOSTERONE: Testosterone: 621.11 ng/dL (ref 300.00–890.00)

## 2021-05-14 NOTE — Assessment & Plan Note (Signed)
Anxiety: See last visit, lost his girlfriend unexpectedly, sees a counselor, feeling somewhat better.  He is also under the care of Dr. Reece Levy. Hyperlipidemia: On Zetia, intolerant to Lipitor and pravastatin, labs on RTC Hypogonadism: Check a testosterone level. Vaccines: Had a flu shot, recommend a COVID booster.  He also is considering Shingrix. ACP: Counseling. RTC May 2023, CPX

## 2021-05-14 NOTE — Patient Instructions (Addendum)
° ° °  GO TO THE LAB : Get the blood work     [place], [place] Come back for physical exam by May 2023    "Living will", "[place] of attorney": Advanced care planning  (If you already have a living will or healthcare power of attorney, please bring the copy to be scanned in your chart.)  Advance care planning is a process that supports adults in  understanding and sharing their preferences regarding future medical care.   The patient's preferences are recorded in documents called Advance Directives.    Advanced directives are completed (and can be modified at any time) while the patient is in full mental capacity.   The documentation should be available at all times to the patient, the family and the healthcare providers.  Bring in a copy to be scanned in your chart is an excellent idea and is recommended   This legal documents direct treatment decision making and/or appoint a surrogate to make the decision if the patient is not capable to do so.    Advance directives can be documented in many types of formats,  documents have names such as:  Lliving will  Durable power of attorney for healthcare (healthcare proxy or healthcare power of attorney)  Combined directives  Physician orders for life-sustaining treatment    More information at:  [email or web address]
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
## 2021-05-15 DIAGNOSIS — F1120 Opioid dependence, uncomplicated: Secondary | ICD-10-CM | POA: Diagnosis not present

## 2021-05-16 DIAGNOSIS — F1120 Opioid dependence, uncomplicated: Secondary | ICD-10-CM | POA: Diagnosis not present

## 2021-05-29 DIAGNOSIS — F1120 Opioid dependence, uncomplicated: Secondary | ICD-10-CM | POA: Diagnosis not present

## 2021-05-30 DIAGNOSIS — F1120 Opioid dependence, uncomplicated: Secondary | ICD-10-CM | POA: Diagnosis not present

## 2021-06-06 DIAGNOSIS — F1120 Opioid dependence, uncomplicated: Secondary | ICD-10-CM | POA: Diagnosis not present

## 2021-06-12 DIAGNOSIS — F1120 Opioid dependence, uncomplicated: Secondary | ICD-10-CM | POA: Diagnosis not present

## 2021-06-13 DIAGNOSIS — F1120 Opioid dependence, uncomplicated: Secondary | ICD-10-CM | POA: Diagnosis not present

## 2021-06-14 ENCOUNTER — Other Ambulatory Visit (HOSPITAL_BASED_OUTPATIENT_CLINIC_OR_DEPARTMENT_OTHER): Payer: Self-pay

## 2021-06-14 ENCOUNTER — Other Ambulatory Visit: Payer: Self-pay | Admitting: Internal Medicine

## 2021-06-14 MED ORDER — CLINDAMYCIN PHOSPHATE 1 % EX LOTN
TOPICAL_LOTION | CUTANEOUS | 3 refills | Status: DC
  Filled 2021-06-14: qty 60, 30d supply, fill #0

## 2021-06-26 DIAGNOSIS — F1120 Opioid dependence, uncomplicated: Secondary | ICD-10-CM | POA: Diagnosis not present

## 2021-06-27 ENCOUNTER — Ambulatory Visit: Payer: PPO | Admitting: Family Medicine

## 2021-06-27 ENCOUNTER — Other Ambulatory Visit (HOSPITAL_BASED_OUTPATIENT_CLINIC_OR_DEPARTMENT_OTHER): Payer: Self-pay

## 2021-06-27 ENCOUNTER — Encounter: Payer: Self-pay | Admitting: Family Medicine

## 2021-06-27 ENCOUNTER — Ambulatory Visit (INDEPENDENT_AMBULATORY_CARE_PROVIDER_SITE_OTHER): Payer: PPO | Admitting: Family Medicine

## 2021-06-27 VITALS — BP 123/62 | HR 64 | Temp 97.7°F | Ht 71.0 in | Wt 159.0 lb

## 2021-06-27 DIAGNOSIS — J0140 Acute pansinusitis, unspecified: Secondary | ICD-10-CM | POA: Diagnosis not present

## 2021-06-27 MED ORDER — FLUTICASONE PROPIONATE 50 MCG/ACT NA SUSP
1.0000 | Freq: Every day | NASAL | 3 refills | Status: DC
  Filled 2021-06-27: qty 16, 30d supply, fill #0

## 2021-06-27 MED ORDER — DOXYCYCLINE HYCLATE 100 MG PO TABS
100.0000 mg | ORAL_TABLET | Freq: Two times a day (BID) | ORAL | 0 refills | Status: AC
  Filled 2021-06-27: qty 14, 7d supply, fill #0

## 2021-06-27 MED ORDER — PREDNISONE 20 MG PO TABS
40.0000 mg | ORAL_TABLET | Freq: Every day | ORAL | 0 refills | Status: AC
Start: 2021-06-27 — End: 2021-07-02
  Filled 2021-06-27: qty 10, 5d supply, fill #0
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
## 2021-06-27 NOTE — Patient Instructions (Signed)
Continue supportive measures including rest, hydration, humidifier use, steam showers, warm compresses to sinuses, warm liquids with lemon and honey, and over-the-counter cough, cold, and analgesics as needed.  ?Adding doxycycline (antibiotic) - take with food and consider a probiotic to help prevent stomach upset. ?Adding prednisone  ?Start Flonase in addition to the allergy medicine/nose spray you are already using.  ? ?Please contact office for follow-up if symptoms do not improve or worsen. Seek emergency care if symptoms become severe. ? ?

## 2021-07-04 ENCOUNTER — Encounter: Payer: 59 | Admitting: Internal Medicine

## 2021-07-06 ENCOUNTER — Other Ambulatory Visit: Payer: Self-pay | Admitting: Internal Medicine

## 2021-07-08 ENCOUNTER — Other Ambulatory Visit (HOSPITAL_BASED_OUTPATIENT_CLINIC_OR_DEPARTMENT_OTHER): Payer: Self-pay

## 2021-07-08 ENCOUNTER — Ambulatory Visit: Payer: PPO | Admitting: Internal Medicine

## 2021-07-10 DIAGNOSIS — F1120 Opioid dependence, uncomplicated: Secondary | ICD-10-CM | POA: Diagnosis not present

## 2021-07-11 ENCOUNTER — Encounter: Payer: Self-pay | Admitting: Internal Medicine

## 2021-07-11 ENCOUNTER — Ambulatory Visit (INDEPENDENT_AMBULATORY_CARE_PROVIDER_SITE_OTHER): Payer: PPO | Admitting: Internal Medicine

## 2021-07-11 ENCOUNTER — Ambulatory Visit: Payer: PPO | Admitting: Internal Medicine

## 2021-07-11 VITALS — BP 126/68 | HR 67 | Temp 98.0°F | Resp 16 | Ht 71.0 in | Wt 161.4 lb

## 2021-07-11 DIAGNOSIS — J0140 Acute pansinusitis, unspecified: Secondary | ICD-10-CM

## 2021-07-11 DIAGNOSIS — J301 Allergic rhinitis due to pollen: Secondary | ICD-10-CM | POA: Diagnosis not present
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
## 2021-07-12 NOTE — Assessment & Plan Note (Signed)
Sinusitis: Seen recently, status post antibiotics and prednisone, here for a recheck, likes to be sure he is okay.  He seems much better, no need for further antibiotics or prednisone. ?Allergies: History of severe allergies in the spring, weight went over prevention: Consistent use of a mask and goggles when he is outdoors,antihistaminics and nasal sprays. ?Follow-up already scheduled. ? ?  ?

## 2021-07-18 DIAGNOSIS — F1120 Opioid dependence, uncomplicated: Secondary | ICD-10-CM | POA: Diagnosis not present

## 2021-07-24 DIAGNOSIS — F1120 Opioid dependence, uncomplicated: Secondary | ICD-10-CM | POA: Diagnosis not present

## 2021-07-26 ENCOUNTER — Telehealth: Payer: Self-pay | Admitting: Internal Medicine

## 2021-07-26 ENCOUNTER — Other Ambulatory Visit (HOSPITAL_BASED_OUTPATIENT_CLINIC_OR_DEPARTMENT_OTHER): Payer: Self-pay

## 2021-07-29 ENCOUNTER — Other Ambulatory Visit (HOSPITAL_BASED_OUTPATIENT_CLINIC_OR_DEPARTMENT_OTHER): Payer: Self-pay

## 2021-07-29 MED ORDER — TESTOSTERONE 1.62 % TD GEL
TRANSDERMAL | 5 refills | Status: DC
  Filled 2021-07-29: qty 150, 40d supply, fill #0
  Filled 2021-09-10: qty 150, 40d supply, fill #1
  Filled 2021-10-30 – 2021-10-31 (×2): qty 150, 40d supply, fill #2
  Filled 2021-12-19 – 2021-12-24 (×3): qty 150, 40d supply, fill #3

## 2021-07-29 NOTE — Telephone Encounter (Signed)
Prescription sent

## 2021-07-29 NOTE — Telephone Encounter (Signed)
Requesting: testosterone 1.62% gel ?Contract: n/a ?UDS: n/a ?Last Visit: 07/11/21 ?Next Visit: 09/17/21 ?Last Refill: 12/26/20 #150g and 3RF ? ?Please Advise ? ?

## 2021-08-07 DIAGNOSIS — F1120 Opioid dependence, uncomplicated: Secondary | ICD-10-CM | POA: Diagnosis not present

## 2021-08-15 ENCOUNTER — Other Ambulatory Visit (HOSPITAL_BASED_OUTPATIENT_CLINIC_OR_DEPARTMENT_OTHER): Payer: Self-pay

## 2021-08-15 ENCOUNTER — Ambulatory Visit (INDEPENDENT_AMBULATORY_CARE_PROVIDER_SITE_OTHER): Payer: PPO | Admitting: Internal Medicine

## 2021-08-15 ENCOUNTER — Encounter: Payer: Self-pay | Admitting: Internal Medicine

## 2021-08-15 VITALS — BP 116/74 | HR 67 | Temp 97.9°F | Resp 16 | Ht 71.0 in | Wt 165.1 lb

## 2021-08-15 DIAGNOSIS — J301 Allergic rhinitis due to pollen: Secondary | ICD-10-CM | POA: Diagnosis not present

## 2021-08-15 MED ORDER — MONTELUKAST SODIUM 10 MG PO TABS
10.0000 mg | ORAL_TABLET | Freq: Every day | ORAL | 3 refills | Status: DC
  Filled 2021-08-15: qty 90, 90d supply, fill #0
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
## 2021-08-15 NOTE — Assessment & Plan Note (Signed)
Allergies: ?Currently on Zyrtec, Astelin, Flonase, using a mask and goggles when outdoors. ?Still has some symptoms, recommend to see an allergist, declined it, has seen them before. ?Also he is concerned about a "white line" at the throat.  Exam is normal. ?Plan: add Singulair.  After these if he is not satisfied with allergy control I will again recommend an allergist referral ?  ?

## 2021-08-16 ENCOUNTER — Other Ambulatory Visit (HOSPITAL_BASED_OUTPATIENT_CLINIC_OR_DEPARTMENT_OTHER): Payer: Self-pay

## 2021-08-16 DIAGNOSIS — F1120 Opioid dependence, uncomplicated: Secondary | ICD-10-CM | POA: Diagnosis not present

## 2021-08-21 DIAGNOSIS — F1120 Opioid dependence, uncomplicated: Secondary | ICD-10-CM | POA: Diagnosis not present

## 2021-09-04 DIAGNOSIS — F1120 Opioid dependence, uncomplicated: Secondary | ICD-10-CM | POA: Diagnosis not present

## 2021-09-10 ENCOUNTER — Other Ambulatory Visit (HOSPITAL_BASED_OUTPATIENT_CLINIC_OR_DEPARTMENT_OTHER): Payer: Self-pay

## 2021-09-10 ENCOUNTER — Ambulatory Visit: Payer: PPO | Admitting: Internal Medicine

## 2021-09-17 ENCOUNTER — Encounter: Payer: Self-pay | Admitting: Internal Medicine

## 2021-09-17 ENCOUNTER — Ambulatory Visit: Payer: PPO | Admitting: Internal Medicine

## 2021-09-17 ENCOUNTER — Other Ambulatory Visit (HOSPITAL_BASED_OUTPATIENT_CLINIC_OR_DEPARTMENT_OTHER): Payer: Self-pay

## 2021-09-17 ENCOUNTER — Ambulatory Visit (INDEPENDENT_AMBULATORY_CARE_PROVIDER_SITE_OTHER): Payer: PPO | Admitting: Internal Medicine

## 2021-09-17 VITALS — BP 124/68 | HR 77 | Temp 97.6°F | Resp 16 | Ht 71.0 in | Wt 158.5 lb

## 2021-09-17 DIAGNOSIS — R739 Hyperglycemia, unspecified: Secondary | ICD-10-CM

## 2021-09-17 DIAGNOSIS — Z122 Encounter for screening for malignant neoplasm of respiratory organs: Secondary | ICD-10-CM

## 2021-09-17 DIAGNOSIS — E785 Hyperlipidemia, unspecified: Secondary | ICD-10-CM | POA: Diagnosis not present

## 2021-09-17 DIAGNOSIS — I10 Essential (primary) hypertension: Secondary | ICD-10-CM | POA: Diagnosis not present

## 2021-09-17 DIAGNOSIS — E291 Testicular hypofunction: Secondary | ICD-10-CM

## 2021-09-17 DIAGNOSIS — Z0000 Encounter for general adult medical examination without abnormal findings: Secondary | ICD-10-CM | POA: Diagnosis not present

## 2021-09-17 DIAGNOSIS — F17200 Nicotine dependence, unspecified, uncomplicated: Secondary | ICD-10-CM

## 2021-09-17 LAB — COMPREHENSIVE METABOLIC PANEL
ALT: 12 U/L (ref 0–53)
AST: 18 U/L (ref 0–37)
Albumin: 4.3 g/dL (ref 3.5–5.2)
Alkaline Phosphatase: 38 U/L — ABNORMAL LOW (ref 39–117)
BUN: 22 mg/dL (ref 6–23)
CO2: 32 mEq/L (ref 19–32)
Calcium: 9.6 mg/dL (ref 8.4–10.5)
Chloride: 99 mEq/L (ref 96–112)
Creatinine, Ser: 1.05 mg/dL (ref 0.40–1.50)
GFR: 74.27 mL/min (ref >60.00)
Glucose, Bld: 109 mg/dL — ABNORMAL HIGH (ref 70–99)
Potassium: 4.9 mEq/L (ref 3.5–5.1)
Sodium: 137 mEq/L (ref 135–145)
Total Bilirubin: 0.6 mg/dL (ref 0.2–1.2)
Total Protein: 7.2 g/dL (ref 6.0–8.3)

## 2021-09-17 LAB — LIPID PANEL
Cholesterol: 171 mg/dL (ref 0–200)
HDL: 47.4 mg/dL (ref >39.00)
LDL Cholesterol: 96 mg/dL (ref 0–99)
NonHDL: 123.15
Total CHOL/HDL Ratio: 4
Triglycerides: 134 mg/dL (ref 0.0–149.0)
VLDL: 26.8 mg/dL (ref 0.0–40.0)

## 2021-09-17 LAB — CBC WITH DIFFERENTIAL/PLATELET
Basophils Absolute: 0 10*3/uL (ref 0.0–0.1)
Basophils Relative: 0.3 % (ref 0.0–3.0)
Eosinophils Absolute: 0 10*3/uL (ref 0.0–0.7)
Eosinophils Relative: 0.4 % (ref 0.0–5.0)
HCT: 42.4 % (ref 39.0–52.0)
Hemoglobin: 14.4 g/dL (ref 13.0–17.0)
Lymphocytes Relative: 13.3 % (ref 12.0–46.0)
Lymphs Abs: 1.3 10*3/uL (ref 0.7–4.0)
MCHC: 33.9 g/dL (ref 30.0–36.0)
MCV: 95.8 fl (ref 78.0–100.0)
Monocytes Absolute: 0.8 10*3/uL (ref 0.1–1.0)
Monocytes Relative: 7.6 % (ref 3.0–12.0)
Neutro Abs: 7.8 10*3/uL — ABNORMAL HIGH (ref 1.4–7.7)
Neutrophils Relative %: 78.4 % — ABNORMAL HIGH (ref 43.0–77.0)
Platelets: 247 10*3/uL (ref 150.0–400.0)
RBC: 4.42 Mil/uL (ref 4.22–5.81)
RDW: 13.3 % (ref 11.5–15.5)
WBC: 10 10*3/uL (ref 4.0–10.5)

## 2021-09-17 LAB — TESTOSTERONE: Testosterone: 497.43 ng/dL (ref 300.00–890.00)

## 2021-09-17 LAB — HEMOGLOBIN A1C: Hgb A1c MFr Bld: 5.6 % (ref 4.6–6.5)

## 2021-09-17 LAB — PSA: PSA: 0.1 ng/mL (ref 0.10–4.00)

## 2021-09-17 MED ORDER — SHINGRIX 50 MCG/0.5ML IM SUSR
0.5000 mL | Freq: Once | INTRAMUSCULAR | 1 refills | Status: AC
  Filled 2021-09-17: qty 0.5, 1d supply, fill #0
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
## 2021-09-17 NOTE — Patient Instructions (Addendum)
Recommend to proceed with the following vaccines at your pharmacy:  Covid booster (bivalent) Shingrix (shingles)- see printed prescription      GO TO THE LAB : Get the blood work     [place], [place] back for a checkup in 6 months

## 2021-09-17 NOTE — Assessment & Plan Note (Signed)
--  Td 03/2019 - pnm 23 01-2015 -  prevnar 2018   - shingrix d/w pt, rx printed  -  covid vax: booster is an option  ---CCS:  2006  Cscope Dr Vladimir Faster Normal , reports had another colonoscopy in 2009 with Dr. Paulita Fujita, unable to get records, reportedly normal; s/p  Cscope 08-30-20 Dr Carlean Purl, next per GI --Prostate ca screening: on HRT, DRE normal, check PSA - Lung cancer screening: d/w pt again today, we agreed to proceed --Lifestyle: very active, diet ok.  - Labs:CMP, FLP, CBC, A1c, total testosterone, PSA - POA package of info provided

## 2021-09-18 DIAGNOSIS — F1120 Opioid dependence, uncomplicated: Secondary | ICD-10-CM | POA: Diagnosis not present

## 2021-10-02 DIAGNOSIS — F1120 Opioid dependence, uncomplicated: Secondary | ICD-10-CM | POA: Diagnosis not present

## 2021-10-11 ENCOUNTER — Telehealth: Payer: Self-pay | Admitting: Internal Medicine

## 2021-10-11 ENCOUNTER — Other Ambulatory Visit (HOSPITAL_BASED_OUTPATIENT_CLINIC_OR_DEPARTMENT_OTHER): Payer: Self-pay

## 2021-10-11 MED ORDER — VALACYCLOVIR HCL 1 G PO TABS
2000.0000 mg | ORAL_TABLET | Freq: Two times a day (BID) | ORAL | 2 refills | Status: AC | PRN
  Filled 2021-10-11: qty 30, 8d supply, fill #0

## 2021-10-16 DIAGNOSIS — F1120 Opioid dependence, uncomplicated: Secondary | ICD-10-CM | POA: Diagnosis not present

## 2021-10-18 ENCOUNTER — Ambulatory Visit (INDEPENDENT_AMBULATORY_CARE_PROVIDER_SITE_OTHER): Payer: PPO | Admitting: Internal Medicine

## 2021-10-18 ENCOUNTER — Other Ambulatory Visit (HOSPITAL_BASED_OUTPATIENT_CLINIC_OR_DEPARTMENT_OTHER): Payer: Self-pay

## 2021-10-18 ENCOUNTER — Encounter: Payer: Self-pay | Admitting: Internal Medicine

## 2021-10-18 VITALS — BP 118/64 | HR 84 | Temp 97.8°F | Resp 18 | Ht 71.0 in | Wt 161.1 lb

## 2021-10-18 DIAGNOSIS — K219 Gastro-esophageal reflux disease without esophagitis: Secondary | ICD-10-CM

## 2021-10-18 DIAGNOSIS — K140 Glossitis: Secondary | ICD-10-CM

## 2021-10-18 MED ORDER — NYSTATIN 100000 UNIT/ML MT SUSP
5.0000 mL | Freq: Four times a day (QID) | OROMUCOSAL | 0 refills | Status: DC
  Filled 2021-10-18: qty 120, 6d supply, fill #0

## 2021-10-18 MED ORDER — PANTOPRAZOLE SODIUM 40 MG PO TBEC
40.0000 mg | DELAYED_RELEASE_TABLET | Freq: Every day | ORAL | 3 refills | Status: DC
  Filled 2021-10-18: qty 30, 30d supply, fill #0

## 2021-10-18 NOTE — Assessment & Plan Note (Signed)
Glossitis: Burning tongue for 3 months, has taking antibiotics x 3 rounds. No obvious etiology, I elected to do the following: Empiric treatment with nystatin If that fails, follow the Magic mouthwash formula provided by his dentist If that fails, he will call for ENT referral. GERD: Mild symptoms, he typically eats in a hurry and then goes to bed. Plan: PPIs for a month then as needed.  Good habits to prevent GERD see AVS.  If he is not better knows to call, given age and symptoms will need a GI referral. He verbalized understanding

## 2021-10-18 NOTE — Patient Instructions (Addendum)
For your tongue problems: Nystatin: Swish and spit 4 times a day for 5 days.  Prescription sent If you are not better, call your dentist and follow he is "Magic mouthwash" treatment. If that does not help call for a referral to ENT.  For acid reflux: Take pantoprazole 40 mg before your breakfast for a month, then as needed only. See below, these are good precautions to prevent heartburn If you are not better we may need to send you to the gastroenterologist.    Food Choices for Gastroesophageal Reflux Disease When you have gastroesophageal reflux disease (GERD), the foods you eat and your eating habits are very important. Choosing the right foods can help ease the discomfort of GERD. Consider working with a dietitian to help you make healthy food choices. What are tips for following this plan? Reading food labels Look for foods that are low in saturated fat. Foods that have less than 5% of daily value (DV) of fat and 0 g of trans fats may help with your symptoms. Cooking Cook foods using methods other than frying. This may include baking, steaming, grilling, or broiling. These are all methods that do not need a lot of fat for cooking. To add flavor, try to use herbs that are low in spice and acidity. Meal planning  Choose healthy foods that are low in fat, such as fruits, vegetables, whole grains, low-fat dairy products, lean meats, fish, and poultry. Eat frequent, small meals instead of three large meals each day. Eat your meals slowly, in a relaxed setting. Avoid bending over or lying down until 2-3 hours after eating. Limit high-fat foods such as fatty meats or fried foods. Limit your intake of fatty foods, such as oils, butter, and shortening. Avoid the following as told by your health care provider: Foods that cause symptoms. These may be different for different people. Keep a food diary to keep track of foods that cause symptoms. Alcohol. Drinking large amounts of liquid with  meals. Eating meals during the 2-3 hours before bed. Lifestyle Maintain a healthy weight. Ask your health care provider what weight is healthy for you. If you need to lose weight, work with your health care provider to do so safely. Exercise for at least 30 minutes on 5 or more days each week, or as told by your health care provider. Avoid wearing clothes that fit tightly around your waist and chest. Do not use any products that contain nicotine or tobacco. These products include cigarettes, chewing tobacco, and vaping devices, such as e-cigarettes. If you need help quitting, ask your health care provider. Sleep with the head of your bed raised. Use a wedge under the mattress or blocks under the bed frame to raise the head of the bed. Chew sugar-free gum after mealtimes. What foods should I eat?  Eat a healthy, well-balanced diet of fruits, vegetables, whole grains, low-fat dairy products, lean meats, fish, and poultry. Each person is different. Foods that may trigger symptoms in one person may not trigger any symptoms in another person. Work with your health care provider to identify foods that are safe for you. The items listed above may not be a complete list of recommended foods and beverages. Contact a dietitian for more information. What foods should I avoid? Limiting some of these foods may help manage the symptoms of GERD. Everyone is different. Consult a dietitian or your health care provider to help you identify the exact foods to avoid, if any. Fruits Any fruits prepared with added  fat. Any fruits that cause symptoms. For some people this may include citrus fruits, such as oranges, grapefruit, pineapple, and lemons. Vegetables Deep-fried vegetables. [place] fries. Any vegetables prepared with added fat. Any vegetables that cause symptoms. For some people, this may include tomatoes and tomato products, chili peppers, onions and garlic, and horseradish. Grains Pastries or quick breads  with added fat. Meats and other proteins High-fat meats, such as fatty beef or pork, hot dogs, ribs, ham, sausage, salami, and bacon. Fried meat or protein, including fried fish and fried chicken. Nuts and nut butters, in large amounts. Dairy Whole milk and chocolate milk. Sour cream. Cream. Ice cream. Cream cheese. Milkshakes. Fats and oils Butter. Margarine. Shortening. Ghee. Beverages Coffee and tea, with or without caffeine. Carbonated beverages. Sodas. Energy drinks. Fruit juice made with acidic fruits, such as orange or grapefruit. Tomato juice. Alcoholic drinks. Sweets and desserts Chocolate and cocoa. Donuts. Seasonings and condiments Pepper. Peppermint and spearmint. Added salt. Any condiments, herbs, or seasonings that cause symptoms. For some people, this may include curry, hot sauce, or vinegar-based salad dressings. The items listed above may not be a complete list of foods and beverages to avoid. Contact a dietitian for more information. Questions to ask your health care provider Diet and lifestyle changes are usually the first steps that are taken to manage symptoms of GERD. If diet and lifestyle changes do not improve your symptoms, talk with your health care provider about taking medicines. Where to find more information International Foundation for Gastrointestinal Disorders: aboutgerd.org Summary When you have gastroesophageal reflux disease (GERD), food and lifestyle choices may be very helpful in easing the discomfort of GERD. Eat frequent, small meals instead of three large meals each day. Eat your meals slowly, in a relaxed setting. Avoid bending over or lying down until 2-3 hours after eating. Limit high-fat foods such as fatty meats or fried foods. This information is not intended to replace advice given to you by your health care provider. Make sure you discuss any questions you have with your health care provider. Document Revised: 10/17/2019 Document Reviewed:  10/17/2019 Elsevier Patient Education  [place].
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
## 2021-10-28 ENCOUNTER — Other Ambulatory Visit (HOSPITAL_BASED_OUTPATIENT_CLINIC_OR_DEPARTMENT_OTHER): Payer: Self-pay

## 2021-10-28 ENCOUNTER — Other Ambulatory Visit: Payer: Self-pay | Admitting: Internal Medicine

## 2021-10-28 MED ORDER — METOPROLOL SUCCINATE ER 25 MG PO TB24
12.5000 mg | ORAL_TABLET | Freq: Every day | ORAL | 1 refills | Status: DC
  Filled 2021-10-28: qty 45, 90d supply, fill #0
  Filled 2022-01-22: qty 45, 90d supply, fill #1

## 2021-10-30 ENCOUNTER — Other Ambulatory Visit (HOSPITAL_BASED_OUTPATIENT_CLINIC_OR_DEPARTMENT_OTHER): Payer: Self-pay

## 2021-10-30 DIAGNOSIS — F1120 Opioid dependence, uncomplicated: Secondary | ICD-10-CM | POA: Diagnosis not present

## 2021-10-31 ENCOUNTER — Other Ambulatory Visit (HOSPITAL_BASED_OUTPATIENT_CLINIC_OR_DEPARTMENT_OTHER): Payer: Self-pay

## 2021-11-01 ENCOUNTER — Other Ambulatory Visit (HOSPITAL_BASED_OUTPATIENT_CLINIC_OR_DEPARTMENT_OTHER): Payer: Self-pay

## 2021-11-11 ENCOUNTER — Telehealth: Payer: Self-pay | Admitting: Internal Medicine

## 2021-11-11 DIAGNOSIS — K140 Glossitis: Secondary | ICD-10-CM

## 2021-11-11 NOTE — Telephone Encounter (Signed)
Pt called stating the would like to request a referral to an ENT specialist in regards to the on and off issues he has been having with burning tongue, Geographic fissures on the tongue, and sputum buildup.

## 2021-11-11 NOTE — Telephone Encounter (Signed)
Referral placed.

## 2021-11-13 DIAGNOSIS — F1120 Opioid dependence, uncomplicated: Secondary | ICD-10-CM | POA: Diagnosis not present

## 2021-11-22 ENCOUNTER — Other Ambulatory Visit (HOSPITAL_BASED_OUTPATIENT_CLINIC_OR_DEPARTMENT_OTHER): Payer: Self-pay

## 2021-11-26 ENCOUNTER — Encounter: Payer: Self-pay | Admitting: Internal Medicine

## 2021-11-26 ENCOUNTER — Ambulatory Visit (INDEPENDENT_AMBULATORY_CARE_PROVIDER_SITE_OTHER): Payer: PPO | Admitting: Internal Medicine

## 2021-11-26 ENCOUNTER — Other Ambulatory Visit (HOSPITAL_BASED_OUTPATIENT_CLINIC_OR_DEPARTMENT_OTHER): Payer: Self-pay

## 2021-11-26 VITALS — BP 126/76 | HR 76 | Temp 98.0°F | Resp 18 | Ht 71.0 in | Wt 159.4 lb

## 2021-11-26 DIAGNOSIS — K838 Other specified diseases of biliary tract: Secondary | ICD-10-CM | POA: Diagnosis not present

## 2021-11-26 DIAGNOSIS — R1013 Epigastric pain: Secondary | ICD-10-CM | POA: Diagnosis not present

## 2021-11-26 DIAGNOSIS — F320 Major depressive disorder, single episode, mild: Secondary | ICD-10-CM | POA: Diagnosis not present

## 2021-11-26 DIAGNOSIS — K8050 Calculus of bile duct without cholangitis or cholecystitis without obstruction: Secondary | ICD-10-CM

## 2021-11-26 DIAGNOSIS — K140 Glossitis: Secondary | ICD-10-CM | POA: Diagnosis not present

## 2021-11-26 DIAGNOSIS — I7143 Infrarenal abdominal aortic aneurysm, without rupture: Secondary | ICD-10-CM
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
## 2021-11-26 NOTE — Patient Instructions (Addendum)
Recommend to proceed with the following vaccines at your pharmacy: Shingrix (shingles) #2 Covid booster (bivalent) Flu shot this fall  We are referring you to the gastroenterology  GO TO THE LAB : Get the blood work     [place], [place] back for   a checkup in 3 months  STOP BY THE FIRST FLOOR: Schedule ultrasound

## 2021-11-27 ENCOUNTER — Ambulatory Visit (HOSPITAL_BASED_OUTPATIENT_CLINIC_OR_DEPARTMENT_OTHER)
Admission: RE | Admit: 2021-11-27 | Discharge: 2021-11-27 | Disposition: A | Discharge status: Home / Self Care | Payer: PPO | Source: Ambulatory Visit | Attending: Internal Medicine | Admitting: Internal Medicine

## 2021-11-27 DIAGNOSIS — F1120 Opioid dependence, uncomplicated: Secondary | ICD-10-CM | POA: Diagnosis not present

## 2021-11-27 DIAGNOSIS — K8020 Calculus of gallbladder without cholecystitis without obstruction: Secondary | ICD-10-CM | POA: Diagnosis not present

## 2021-11-27 DIAGNOSIS — R1013 Epigastric pain: Secondary | ICD-10-CM | POA: Diagnosis not present

## 2021-11-27 LAB — B12 AND FOLATE PANEL
Folate: 13.4 ng/mL (ref >5.9)
Vitamin B-12: >1500 pg/mL — ABNORMAL HIGH (ref 211–911)

## 2021-11-27 LAB — CBC WITH DIFFERENTIAL/PLATELET
Basophils Absolute: 0.1 10*3/uL (ref 0.0–0.1)
Basophils Relative: 1.3 % (ref 0.0–3.0)
Eosinophils Absolute: 0.2 10*3/uL (ref 0.0–0.7)
Eosinophils Relative: 1.9 % (ref 0.0–5.0)
HCT: 41.4 % (ref 39.0–52.0)
Hemoglobin: 14.1 g/dL (ref 13.0–17.0)
Lymphocytes Relative: 17.1 % (ref 12.0–46.0)
Lymphs Abs: 1.6 10*3/uL (ref 0.7–4.0)
MCHC: 34 g/dL (ref 30.0–36.0)
MCV: 95.2 fl (ref 78.0–100.0)
Monocytes Absolute: 0.8 10*3/uL (ref 0.1–1.0)
Monocytes Relative: 8.7 % (ref 3.0–12.0)
Neutro Abs: 6.5 10*3/uL (ref 1.4–7.7)
Neutrophils Relative %: 71 % (ref 43.0–77.0)
Platelets: 194 10*3/uL (ref 150.0–400.0)
RBC: 4.35 Mil/uL (ref 4.22–5.81)
RDW: 13.1 % (ref 11.5–15.5)
WBC: 9.2 10*3/uL (ref 4.0–10.5)

## 2021-11-27 LAB — COMPREHENSIVE METABOLIC PANEL
ALT: 13 U/L (ref 0–53)
AST: 22 U/L (ref 0–37)
Albumin: 4.3 g/dL (ref 3.5–5.2)
Alkaline Phosphatase: 52 U/L (ref 39–117)
BUN: 27 mg/dL — ABNORMAL HIGH (ref 6–23)
CO2: 33 mEq/L — ABNORMAL HIGH (ref 19–32)
Calcium: 9.5 mg/dL (ref 8.4–10.5)
Chloride: 98 mEq/L (ref 96–112)
Creatinine, Ser: 1.24 mg/dL (ref 0.40–1.50)
GFR: 60.75 mL/min (ref >60.00)
Glucose, Bld: 78 mg/dL (ref 70–99)
Potassium: 5 mEq/L (ref 3.5–5.1)
Sodium: 137 mEq/L (ref 135–145)
Total Bilirubin: 0.3 mg/dL (ref 0.2–1.2)
Total Protein: 7.1 g/dL (ref 6.0–8.3)

## 2021-11-27 LAB — VITAMIN D 25 HYDROXY (VIT D DEFICIENCY, FRACTURES): VITD: 100.42 ng/mL — ABNORMAL HIGH (ref 30.00–100.00)

## 2021-11-27 NOTE — Assessment & Plan Note (Addendum)
Multiple issues Glossitis: See last visit, not better, to see ENT soon.  On B12 supplements.  Plan: Check vitamin levels GERD: See last visit, reported GERD, took PPIs, not much better.  In addition is concerned about a single episode of RUQ pain.  History of hepatitis C, status post treatment. Plan: CMP, CBC, ultrasound abdomen, GI referral.  Continue PPIs Fatigue: Checking labs as above.  Possibly multifactorial.  Epworth scale negative: See next. Depression - PHQ-9 scored 8.  Despite the low score,  he feels that he has not bounced back from losing his girlfriend, c/o tah all he does is  "work and work". Offered medication, SSRI but he declined.   RTC 3 months

## 2021-11-28 ENCOUNTER — Telehealth: Payer: Self-pay | Admitting: Internal Medicine

## 2021-11-28 NOTE — Addendum Note (Signed)
Addended byDamita Dunnings D on: 11/28/2021 09:37 AM   Modules accepted: Orders

## 2021-11-28 NOTE — Telephone Encounter (Addendum)
Abdominal ultrasound:  - Dilated common bile duct, recent RUQ abdominal pain.  Discussed with GI via  message, they agree with MRI/MRCP, please arrange.  If results are unremarkable but he still has a bile duct dilatation, next step would be an endoscopy and the ultrasound.  - Infrarenal aortic aneurysm: Refer to vascular surgery, please arrange.  - Blood work: Essentially normal.    All of the above discussed with patient. ER if  RUQ pain. Also due to AA: ER if severe stomach pain. Likely will need a surgical referral after the MRCP.

## 2021-11-28 NOTE — Telephone Encounter (Signed)
MRCP ordered. Referral to vascular surgery placed.

## 2021-12-03 DIAGNOSIS — F1120 Opioid dependence, uncomplicated: Secondary | ICD-10-CM | POA: Diagnosis not present

## 2021-12-04 ENCOUNTER — Encounter: Payer: Self-pay | Admitting: Vascular Surgery

## 2021-12-04 ENCOUNTER — Ambulatory Visit: Payer: PPO | Admitting: Vascular Surgery

## 2021-12-04 VITALS — BP 109/59 | HR 77 | Temp 98.7°F | Ht 71.0 in | Wt 156.5 lb

## 2021-12-04 DIAGNOSIS — I7143 Infrarenal abdominal aortic aneurysm, without rupture: Secondary | ICD-10-CM | POA: Diagnosis not present
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
## 2021-12-06 ENCOUNTER — Other Ambulatory Visit: Payer: Self-pay

## 2021-12-06 DIAGNOSIS — I7143 Infrarenal abdominal aortic aneurysm, without rupture: Secondary | ICD-10-CM

## 2021-12-10 ENCOUNTER — Other Ambulatory Visit: Payer: Self-pay | Admitting: Internal Medicine

## 2021-12-10 ENCOUNTER — Ambulatory Visit (HOSPITAL_COMMUNITY)
Admission: RE | Admit: 2021-12-10 | Discharge: 2021-12-10 | Disposition: A | Discharge status: Home / Self Care | Payer: PPO | Source: Ambulatory Visit | Attending: Internal Medicine | Admitting: Internal Medicine

## 2021-12-10 DIAGNOSIS — K8050 Calculus of bile duct without cholangitis or cholecystitis without obstruction: Secondary | ICD-10-CM

## 2021-12-10 DIAGNOSIS — K838 Other specified diseases of biliary tract: Secondary | ICD-10-CM

## 2021-12-10 DIAGNOSIS — I714 Abdominal aortic aneurysm, without rupture, unspecified: Secondary | ICD-10-CM | POA: Diagnosis not present

## 2021-12-10 DIAGNOSIS — K8689 Other specified diseases of pancreas: Secondary | ICD-10-CM | POA: Diagnosis not present

## 2021-12-10 DIAGNOSIS — K8020 Calculus of gallbladder without cholecystitis without obstruction: Secondary | ICD-10-CM | POA: Diagnosis not present

## 2021-12-10 MED ORDER — GADOBUTROL 1 MMOL/ML IV SOLN
7.0000 mL | Freq: Once | INTRAVENOUS | Status: AC | PRN
  Administered 2021-12-10: 7 mL via INTRAVENOUS

## 2021-12-11 DIAGNOSIS — F1120 Opioid dependence, uncomplicated: Secondary | ICD-10-CM | POA: Diagnosis not present

## 2021-12-18 DIAGNOSIS — F1120 Opioid dependence, uncomplicated: Secondary | ICD-10-CM | POA: Diagnosis not present

## 2021-12-19 ENCOUNTER — Other Ambulatory Visit (HOSPITAL_BASED_OUTPATIENT_CLINIC_OR_DEPARTMENT_OTHER): Payer: Self-pay

## 2021-12-19 DIAGNOSIS — K146 Glossodynia: Secondary | ICD-10-CM | POA: Diagnosis not present

## 2021-12-19 MED ORDER — NYSTATIN 100000 UNIT/ML MT SUSP
OROMUCOSAL | 0 refills | Status: DC
  Filled 2021-12-19: qty 60, 6d supply, fill #0

## 2021-12-20 ENCOUNTER — Other Ambulatory Visit (HOSPITAL_BASED_OUTPATIENT_CLINIC_OR_DEPARTMENT_OTHER): Payer: Self-pay

## 2021-12-24 ENCOUNTER — Other Ambulatory Visit (HOSPITAL_COMMUNITY): Payer: Self-pay

## 2021-12-24 ENCOUNTER — Other Ambulatory Visit (HOSPITAL_BASED_OUTPATIENT_CLINIC_OR_DEPARTMENT_OTHER): Payer: Self-pay

## 2021-12-25 DIAGNOSIS — F1120 Opioid dependence, uncomplicated: Secondary | ICD-10-CM | POA: Diagnosis not present

## 2021-12-27 ENCOUNTER — Ambulatory Visit: Payer: PPO | Admitting: Gastroenterology

## 2021-12-27 ENCOUNTER — Other Ambulatory Visit (INDEPENDENT_AMBULATORY_CARE_PROVIDER_SITE_OTHER): Payer: PPO

## 2021-12-27 ENCOUNTER — Encounter: Payer: Self-pay | Admitting: Gastroenterology

## 2021-12-27 VITALS — BP 118/82 | HR 66 | Ht 69.0 in | Wt 158.0 lb

## 2021-12-27 DIAGNOSIS — K831 Obstruction of bile duct: Secondary | ICD-10-CM | POA: Diagnosis not present

## 2021-12-27 DIAGNOSIS — K838 Other specified diseases of biliary tract: Secondary | ICD-10-CM | POA: Diagnosis not present

## 2021-12-27 DIAGNOSIS — R634 Abnormal weight loss: Secondary | ICD-10-CM | POA: Diagnosis not present

## 2021-12-27 LAB — HEPATIC FUNCTION PANEL
ALT: 14 U/L (ref 0–53)
AST: 22 U/L (ref 0–37)
Albumin: 4.1 g/dL (ref 3.5–5.2)
Alkaline Phosphatase: 52 U/L (ref 39–117)
Bilirubin, Direct: 0.1 mg/dL (ref 0.0–0.3)
Total Bilirubin: 0.4 mg/dL (ref 0.2–1.2)
Total Protein: 7.6 g/dL (ref 6.0–8.3)

## 2021-12-27 NOTE — Patient Instructions (Signed)
If you are age 66 or older, your body mass index should be between 23-30. Your Body mass index is 23.33 kg/m. If this is out of the aforementioned range listed, please consider follow up with your Primary Care Provider.  If you are age 51 or younger, your body mass index should be between 19-25. Your Body mass index is 23.33 kg/m. If this is out of the aformentioned range listed, please consider follow up with your Primary Care Provider.   ________________________________________________________  The [facility] providers would like to encourage you to use [facility] to communicate with providers for non-urgent requests or questions.  Due to long hold times on the telephone, sending your provider a message by [facility] may be a faster and more efficient way to get a response.  Please allow 48 business hours for a response.  Please remember that this is for non-urgent requests.  _______________________________________________________   Your provider has requested that you go to the basement level for lab work before leaving today. Press "B" on the elevator. The lab is located at the first door on the left as you exit the elevator.   Our office will contact you regarding an ERCP.  Due to recent changes in healthcare laws, you may see the results of your imaging and laboratory studies on MyChart before your provider has had a chance to review them.  We understand that in some cases there may be results that are confusing or concerning to you. Not all laboratory results come back in the same time frame and the provider may be waiting for multiple results in order to interpret others.  Please give [place] 48 hours in order for your provider to thoroughly review all the results before contacting the office for clarification of your results.    It was a pleasure to see you today!  Thank you for trusting me with your gastrointestinal care!
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
## 2021-12-30 ENCOUNTER — Other Ambulatory Visit: Payer: Self-pay

## 2021-12-30 DIAGNOSIS — K831 Obstruction of bile duct: Secondary | ICD-10-CM

## 2021-12-30 DIAGNOSIS — K861 Other chronic pancreatitis: Secondary | ICD-10-CM

## 2021-12-30 LAB — CANCER ANTIGEN 19-9: CA 19-9: 46 U/mL — ABNORMAL HIGH (ref <34)

## 2022-01-08 DIAGNOSIS — F1120 Opioid dependence, uncomplicated: Secondary | ICD-10-CM | POA: Diagnosis not present

## 2022-01-13 ENCOUNTER — Telehealth: Payer: Self-pay | Admitting: Gastroenterology

## 2022-01-13 NOTE — Telephone Encounter (Signed)
Inbound call from patient requesting a call back. He states he's not sure he will be "around" for his eus procedure in October, states he is going down hill fast. Please advise.

## 2022-01-13 NOTE — Telephone Encounter (Signed)
The pt is calling to report that he is very anxious and wanted to move his appt sooner for EUS. I explained that we do not have any sooner appts at this time. He wants me to pass on to [place] that his skin is getting very dry, he has some vision changes and has lost 4 pounds since last office visit.  Please advise if there are any recommendations  (she is aware that Janett Billow is not in the office today)

## 2022-01-14 NOTE — Telephone Encounter (Signed)
Left message on machine to call back  

## 2022-01-15 NOTE — Telephone Encounter (Signed)
The pt has been advised.  He will call back if he has any further concerns.

## 2022-01-15 NOTE — Telephone Encounter (Signed)
Left message on machine to call back  

## 2022-01-21 DIAGNOSIS — L820 Inflamed seborrheic keratosis: Secondary | ICD-10-CM | POA: Diagnosis not present

## 2022-01-22 ENCOUNTER — Other Ambulatory Visit (HOSPITAL_BASED_OUTPATIENT_CLINIC_OR_DEPARTMENT_OTHER): Payer: Self-pay

## 2022-01-22 DIAGNOSIS — F1120 Opioid dependence, uncomplicated: Secondary | ICD-10-CM | POA: Diagnosis not present

## 2022-02-05 DIAGNOSIS — F1120 Opioid dependence, uncomplicated: Secondary | ICD-10-CM | POA: Diagnosis not present

## 2022-02-06 ENCOUNTER — Encounter (HOSPITAL_COMMUNITY): Payer: Self-pay | Admitting: Gastroenterology

## 2022-02-06 NOTE — Progress Notes (Signed)
Attempted to obtain medical history via telephone, unable to reach at this time. HIPAA compliant voicemail message left requesting return call to pre surgical testing department. 

## 2022-02-10 ENCOUNTER — Telehealth: Payer: Self-pay | Admitting: Gastroenterology

## 2022-02-10 ENCOUNTER — Telehealth: Payer: Self-pay | Admitting: Internal Medicine

## 2022-02-10 ENCOUNTER — Other Ambulatory Visit (HOSPITAL_BASED_OUTPATIENT_CLINIC_OR_DEPARTMENT_OTHER): Payer: Self-pay

## 2022-02-10 DIAGNOSIS — K831 Obstruction of bile duct: Secondary | ICD-10-CM

## 2022-02-10 MED ORDER — TESTOSTERONE 1.62 % TD GEL
3.0000 | Freq: Every day | TRANSDERMAL | 5 refills | Status: DC
  Filled 2022-02-10 – 2022-02-25 (×2): qty 150, 40d supply, fill #0
  Filled 2022-03-31 – 2022-04-16 (×3): qty 150, 40d supply, fill #1
  Filled 2022-04-22: qty 75, 20d supply, fill #0
  Filled 2022-05-26: qty 150, 40d supply, fill #1

## 2022-02-10 NOTE — Progress Notes (Signed)
At approximately 1230, pt called [facility] endo with questions regarding his EUS procedure for 10/26. Pt has concerns about whether he will get relief from procedure and is asking to speak to the MD. MD [place] notified. Staff message sent to MD Mansouraty and MD Carlean Purl.  Debarah Crape, RN 02/10/22 1:18 PM

## 2022-02-10 NOTE — Telephone Encounter (Signed)
Requesting: testosterone 1.62% gel Contract: n/a UDS: n/a  Last Visit: 11/26/21 Next Visit: 03/17/22 Last Refill: 07/29/21 #150g and 2RF   Please Advise

## 2022-02-10 NOTE — Telephone Encounter (Signed)
Left message on machine to call back  

## 2022-02-10 NOTE — Telephone Encounter (Signed)
I spoke to him  He has pruritus and some abdominal pain and also weight loss. Thinks vision is off also.  I told him vision issue not related to his GI-biliary issues but that we should get some labs  He will come tomorrow for CBC, CMET, lipase, amylase and INR which I have ordered.  He was advised to go to ED if he felt symptoms were severe.

## 2022-02-10 NOTE — Telephone Encounter (Signed)
CG, Lets see what the labs look like and go from there. Thanks. GM

## 2022-02-10 NOTE — Telephone Encounter (Signed)
Inbound call from patient stating that he had seen Jessica back on 9/8. Patient stated that he has had ultrasounds, MRI's and had blood work. Patient stated that he is scheduled for an EUS on 10/26 with Dr. Rush Landmark and was under the impression that he was going to be getting relief from the procedure,but found out that he would not be getting relief from someone at [facility]. Patient stated that he has went down hill since he was last seen here and does not feel comfortable with this procedure because he is needing relief, not another ultrasound. Patient is requesting a call back to discuss and to see if there is anyway his bile duct can be drained. Please advise.

## 2022-02-10 NOTE — Telephone Encounter (Signed)
Spoke to [place], I have relayed the return communication from Dr Rush Landmark. Patient states he is just confused about why he cant just have an ERCP with stent placement. Explained Dr Rush Landmark would need go gather additional information before proceeding. Advised him to have the doctor explain on more detail on the procedure date 02-13-2022.

## 2022-02-10 NOTE — Telephone Encounter (Signed)
PDMP okay, Rx sent 

## 2022-02-10 NOTE — Telephone Encounter (Signed)
Dr Rush Landmark this pt called because he is very upset about a call he had with [facility] endo.  He was told that you would not be able to do anything to help him at his upcoming EUS.  He says he was told that this is "exploratory" and would need another procedure scheduled to do anything about his bile duct.  He would appreciate a call at [phone] to discuss.  He is very upset about the call with [facility] endo.

## 2022-02-10 NOTE — Telephone Encounter (Signed)
Patty, Thank you for forwarding this to me.  I am sorry that the patient feels this way.  I have never met him and certainly it is not "just exploratory" but rather to try and obtain a potential diagnosis which is the reason why Dr. Carlean Purl and an [place] and I had felt EUS was the next step for his evaluation.  I will let them know (put them on this note) and they can reach out to speak with the patient further about things.  For now keep the current slot that is scheduled so that they have opportunity to talk with him and let him know why we are trying to do the EUS to evaluate the pancreas.  Correct I will not be able to "fix things for him" but certainly need to try to understand things better. GM  FYI JZ and CG, please reach out to patient and let me know if we are still keeping the EUS slot or if I am giving it away.

## 2022-02-10 NOTE — Addendum Note (Signed)
Addended by: Gatha Mayer on: 02/10/2022 04:44 PM   Modules accepted: Orders

## 2022-02-11 ENCOUNTER — Other Ambulatory Visit (INDEPENDENT_AMBULATORY_CARE_PROVIDER_SITE_OTHER): Payer: PPO

## 2022-02-11 DIAGNOSIS — K831 Obstruction of bile duct: Secondary | ICD-10-CM

## 2022-02-11 LAB — COMPREHENSIVE METABOLIC PANEL
ALT: 11 U/L (ref 0–53)
AST: 17 U/L (ref 0–37)
Albumin: 4.2 g/dL (ref 3.5–5.2)
Alkaline Phosphatase: 43 U/L (ref 39–117)
BUN: 25 mg/dL — ABNORMAL HIGH (ref 6–23)
CO2: 34 mEq/L — ABNORMAL HIGH (ref 19–32)
Calcium: 9.9 mg/dL (ref 8.4–10.5)
Chloride: 99 mEq/L (ref 96–112)
Creatinine, Ser: 1.13 mg/dL (ref 0.40–1.50)
GFR: 67.82 mL/min (ref >60.00)
Glucose, Bld: 110 mg/dL — ABNORMAL HIGH (ref 70–99)
Potassium: 4.7 mEq/L (ref 3.5–5.1)
Sodium: 137 mEq/L (ref 135–145)
Total Bilirubin: 0.5 mg/dL (ref 0.2–1.2)
Total Protein: 7.4 g/dL (ref 6.0–8.3)

## 2022-02-11 LAB — CBC WITH DIFFERENTIAL/PLATELET
Basophils Absolute: 0 10*3/uL (ref 0.0–0.1)
Basophils Relative: 0.4 % (ref 0.0–3.0)
Eosinophils Absolute: 0.2 10*3/uL (ref 0.0–0.7)
Eosinophils Relative: 1.6 % (ref 0.0–5.0)
HCT: 44.6 % (ref 39.0–52.0)
Hemoglobin: 15 g/dL (ref 13.0–17.0)
Lymphocytes Relative: 16.7 % (ref 12.0–46.0)
Lymphs Abs: 1.7 10*3/uL (ref 0.7–4.0)
MCHC: 33.5 g/dL (ref 30.0–36.0)
MCV: 95 fl (ref 78.0–100.0)
Monocytes Absolute: 0.9 10*3/uL (ref 0.1–1.0)
Monocytes Relative: 8.2 % (ref 3.0–12.0)
Neutro Abs: 7.6 10*3/uL (ref 1.4–7.7)
Neutrophils Relative %: 73.1 % (ref 43.0–77.0)
Platelets: 252 10*3/uL (ref 150.0–400.0)
RBC: 4.7 Mil/uL (ref 4.22–5.81)
RDW: 13 % (ref 11.5–15.5)
WBC: 10.4 10*3/uL (ref 4.0–10.5)

## 2022-02-11 LAB — PROTIME-INR
INR: 1 ratio (ref 0.8–1.0)
Prothrombin Time: 10.9 s (ref 9.6–13.1)

## 2022-02-11 LAB — AMYLASE: Amylase: 130 U/L (ref 27–131)

## 2022-02-11 LAB — LIPASE: Lipase: 68 U/L — ABNORMAL HIGH (ref 11.0–59.0)

## 2022-02-13 ENCOUNTER — Ambulatory Visit (HOSPITAL_COMMUNITY): Payer: PPO | Admitting: Certified Registered Nurse Anesthetist

## 2022-02-13 ENCOUNTER — Encounter (HOSPITAL_COMMUNITY): Payer: Self-pay | Admitting: Gastroenterology

## 2022-02-13 ENCOUNTER — Ambulatory Visit (HOSPITAL_BASED_OUTPATIENT_CLINIC_OR_DEPARTMENT_OTHER): Payer: PPO | Admitting: Certified Registered Nurse Anesthetist

## 2022-02-13 ENCOUNTER — Other Ambulatory Visit (HOSPITAL_BASED_OUTPATIENT_CLINIC_OR_DEPARTMENT_OTHER): Payer: Self-pay

## 2022-02-13 ENCOUNTER — Ambulatory Visit (HOSPITAL_COMMUNITY)
Admission: RE | Admit: 2022-02-13 | Discharge: 2022-02-13 | Disposition: A | Discharge status: Home / Self Care | Payer: PPO | Attending: Gastroenterology | Admitting: Gastroenterology

## 2022-02-13 ENCOUNTER — Other Ambulatory Visit: Payer: Self-pay

## 2022-02-13 ENCOUNTER — Encounter (HOSPITAL_COMMUNITY)
Admission: RE | Disposition: A | Discharge status: Home / Self Care | Payer: Self-pay | Source: Home / Self Care | Attending: Gastroenterology

## 2022-02-13 DIAGNOSIS — R978 Other abnormal tumor markers: Secondary | ICD-10-CM

## 2022-02-13 DIAGNOSIS — K2289 Other specified disease of esophagus: Secondary | ICD-10-CM | POA: Diagnosis not present

## 2022-02-13 DIAGNOSIS — I899 Noninfective disorder of lymphatic vessels and lymph nodes, unspecified: Secondary | ICD-10-CM | POA: Diagnosis not present

## 2022-02-13 DIAGNOSIS — K861 Other chronic pancreatitis: Secondary | ICD-10-CM

## 2022-02-13 DIAGNOSIS — R1013 Epigastric pain: Secondary | ICD-10-CM | POA: Insufficient documentation

## 2022-02-13 DIAGNOSIS — R1011 Right upper quadrant pain: Secondary | ICD-10-CM | POA: Diagnosis not present

## 2022-02-13 DIAGNOSIS — K3189 Other diseases of stomach and duodenum: Secondary | ICD-10-CM | POA: Diagnosis not present

## 2022-02-13 DIAGNOSIS — K831 Obstruction of bile duct: Secondary | ICD-10-CM

## 2022-02-13 DIAGNOSIS — K8020 Calculus of gallbladder without cholecystitis without obstruction: Secondary | ICD-10-CM

## 2022-02-13 DIAGNOSIS — I10 Essential (primary) hypertension: Secondary | ICD-10-CM

## 2022-02-13 DIAGNOSIS — F17210 Nicotine dependence, cigarettes, uncomplicated: Secondary | ICD-10-CM

## 2022-02-13 DIAGNOSIS — K869 Disease of pancreas, unspecified: Secondary | ICD-10-CM | POA: Insufficient documentation

## 2022-02-13 DIAGNOSIS — K8010 Calculus of gallbladder with chronic cholecystitis without obstruction: Secondary | ICD-10-CM | POA: Diagnosis not present

## 2022-02-13 DIAGNOSIS — K838 Other specified diseases of biliary tract: Secondary | ICD-10-CM | POA: Diagnosis not present

## 2022-02-13 DIAGNOSIS — F17200 Nicotine dependence, unspecified, uncomplicated: Secondary | ICD-10-CM | POA: Diagnosis not present

## 2022-02-13 DIAGNOSIS — K449 Diaphragmatic hernia without obstruction or gangrene: Secondary | ICD-10-CM | POA: Diagnosis not present

## 2022-02-13 DIAGNOSIS — R933 Abnormal findings on diagnostic imaging of other parts of digestive tract: Secondary | ICD-10-CM | POA: Insufficient documentation

## 2022-02-13 DIAGNOSIS — K839 Disease of biliary tract, unspecified: Secondary | ICD-10-CM | POA: Diagnosis not present

## 2022-02-13 DIAGNOSIS — R935 Abnormal findings on diagnostic imaging of other abdominal regions, including retroperitoneum: Secondary | ICD-10-CM | POA: Insufficient documentation

## 2022-02-13 DIAGNOSIS — M86679 Other chronic osteomyelitis, unspecified ankle and foot: Secondary | ICD-10-CM | POA: Diagnosis not present

## 2022-02-13 HISTORY — PX: BIOPSY: SHX5522

## 2022-02-13 HISTORY — PX: ESOPHAGOGASTRODUODENOSCOPY: SHX5428

## 2022-02-13 HISTORY — PX: EUS: SHX5427

## 2022-02-13 SURGERY — UPPER ENDOSCOPIC ULTRASOUND (EUS) RADIAL
Anesthesia: Monitor Anesthesia Care

## 2022-02-13 MED ORDER — SODIUM CHLORIDE 0.9 % IV SOLN: INTRAVENOUS | Status: DC

## 2022-02-13 MED ORDER — SUCRALFATE 1 G PO TABS
1.0000 g | ORAL_TABLET | Freq: Two times a day (BID) | ORAL | 1 refills | Status: DC
  Filled 2022-02-13: qty 60, 30d supply, fill #0

## 2022-02-13 MED ORDER — PROPOFOL 10 MG/ML IV BOLUS
INTRAVENOUS | Status: DC | PRN
  Administered 2022-02-13: 20 mg via INTRAVENOUS

## 2022-02-13 MED ORDER — DEXMEDETOMIDINE HCL IN NACL 80 MCG/20ML IV SOLN
INTRAVENOUS | Status: DC | PRN
  Administered 2022-02-13: 8 ug via BUCCAL

## 2022-02-13 MED ORDER — LACTATED RINGERS IV SOLN
INTRAVENOUS | Status: AC | PRN
  Administered 2022-02-13: 10 mL/h via INTRAVENOUS

## 2022-02-13 MED ORDER — PROPOFOL 1000 MG/100ML IV EMUL
INTRAVENOUS | Status: AC
  Filled 2022-02-13: qty 100

## 2022-02-13 MED ORDER — ONDANSETRON HCL 4 MG/2ML IJ SOLN
INTRAMUSCULAR | Status: DC | PRN
  Administered 2022-02-13: 4 mg via INTRAVENOUS

## 2022-02-13 MED ORDER — LIDOCAINE 2% (20 MG/ML) 5 ML SYRINGE
INTRAMUSCULAR | Status: DC | PRN
  Administered 2022-02-13: 100 mg via INTRAVENOUS

## 2022-02-13 MED ORDER — PROPOFOL 500 MG/50ML IV EMUL
INTRAVENOUS | Status: DC | PRN
  Administered 2022-02-13: 150 ug/kg/min via INTRAVENOUS

## 2022-02-13 MED ORDER — PANTOPRAZOLE SODIUM 40 MG PO TBEC
40.0000 mg | DELAYED_RELEASE_TABLET | Freq: Two times a day (BID) | ORAL | 3 refills | Status: DC
  Filled 2022-02-13: qty 30, 15d supply, fill #0
  Filled 2022-03-11: qty 30, 15d supply, fill #1
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
## 2022-02-13 NOTE — Anesthesia Preprocedure Evaluation (Signed)
Anesthesia Evaluation  Patient identified by MRN, date of birth, ID band Patient awake    Reviewed: Allergy & Precautions, NPO status , Patient's Chart, lab work & pertinent test results  Airway Mallampati: II  TM Distance: >3 FB Neck ROM: Full    Dental no notable dental hx.    Pulmonary neg pulmonary ROS, Current Smoker and Patient abstained from smoking.,    Pulmonary exam normal breath sounds clear to auscultation       Cardiovascular hypertension, Pt. on medications negative cardio ROS Normal cardiovascular exam Rhythm:Regular Rate:Normal     Neuro/Psych Anxiety negative neurological ROS  negative psych ROS   GI/Hepatic negative GI ROS, Neg liver ROS,   Endo/Other  negative endocrine ROS  Renal/GU negative Renal ROS  negative genitourinary   Musculoskeletal  (+) Arthritis , Osteoarthritis,    Abdominal   Peds negative pediatric ROS (+)  Hematology negative hematology ROS (+)   Anesthesia Other Findings   Reproductive/Obstetrics negative OB ROS                             Anesthesia Physical Anesthesia Plan  ASA: 2  Anesthesia Plan: MAC   Post-op Pain Management: Minimal or no pain anticipated   Induction: Intravenous  PONV Risk Score and Plan: 0 and Treatment may vary due to age or medical condition  Airway Management Planned: Nasal Cannula  Additional Equipment:   Intra-op Plan:   Post-operative Plan:   Informed Consent: I have reviewed the patients History and Physical, chart, labs and discussed the procedure including the risks, benefits and alternatives for the proposed anesthesia with the patient or authorized representative who has indicated his/her understanding and acceptance.     Dental advisory given  Plan Discussed with: CRNA  Anesthesia Plan Comments:         Anesthesia Quick Evaluation

## 2022-02-13 NOTE — Anesthesia Postprocedure Evaluation (Signed)
Anesthesia Post Note  Patient: AIDRIC ENDICOTT  Procedure(s) Performed: UPPER ENDOSCOPIC ULTRASOUND (EUS) RADIAL ESOPHAGOGASTRODUODENOSCOPY (EGD) BIOPSY     Patient location during evaluation: PACU Anesthesia Type: MAC Level of consciousness: awake and alert Pain management: pain level controlled Vital Signs Assessment: post-procedure vital signs reviewed and stable Respiratory status: spontaneous breathing, nonlabored ventilation and respiratory function stable Cardiovascular status: blood pressure returned to baseline and stable Postop Assessment: no apparent nausea or vomiting Anesthetic complications: no   No notable events documented.  Last Vitals:  Vitals:   02/13/22 1327 02/13/22 1337  BP: 104/61 (!) 144/82  Pulse: 66 64  Resp: 18 20  Temp:    SpO2: 100% 100%    Last Pain:  Vitals:   02/13/22 1337  TempSrc:   PainSc: 0-No pain                 Lynda Rainwater

## 2022-02-13 NOTE — Discharge Instructions (Signed)
YOU HAD AN ENDOSCOPIC PROCEDURE TODAY: Refer to the procedure report and other information in the discharge instructions given to you for any specific questions about what was found during the examination. If this information does not answer your questions, please call [place] office at [phone] to clarify.  ° °YOU SHOULD EXPECT: Some feelings of bloating in the abdomen. Passage of more gas than usual. Walking can help get rid of the air that was put into your GI tract during the procedure and reduce the bloating. If you had a lower endoscopy (such as a colonoscopy or flexible sigmoidoscopy) you may notice spotting of blood in your stool or on the toilet paper. Some abdominal soreness may be present for a day or two, also. ° °DIET: Your first meal following the procedure should be a light meal and then it is ok to progress to your normal diet. A half-sandwich or bowl of soup is an example of a good first meal. Heavy or fried foods are harder to digest and may make you feel nauseous or bloated. Drink plenty of fluids but you should avoid alcoholic beverages for 24 hours. If you had a esophageal dilation, please see attached instructions for diet.   ° °ACTIVITY: Your care partner should take you home directly after the procedure. You should plan to take it easy, moving slowly for the rest of the day. You can resume normal activity the day after the procedure however YOU SHOULD NOT DRIVE, use power tools, machinery or perform tasks that involve climbing or major physical exertion for 24 hours (because of the sedation medicines used during the test).  ° °SYMPTOMS TO REPORT IMMEDIATELY: °A gastroenterologist can be reached at any hour. Please call [phone]  for any of the following symptoms:  °Following lower endoscopy (colonoscopy, flexible sigmoidoscopy) °Excessive amounts of blood in the stool  °Significant tenderness, worsening of abdominal pains  °Swelling of the abdomen that is new, acute  °Fever of 100° or  higher  °Following upper endoscopy (EGD, EUS, ERCP, esophageal dilation) °Vomiting of blood or coffee ground material  °New, significant abdominal pain  °New, significant chest pain or pain under the shoulder blades  °Painful or persistently difficult swallowing  °New shortness of breath  °Black, tarry-looking or red, bloody stools ° °FOLLOW UP:  °If any biopsies were taken you will be contacted by phone or by letter within the next 1-3 weeks. Call [phone]  if you have not heard about the biopsies in 3 weeks.  °Please also call with any specific questions about appointments or follow up tests. ° °

## 2022-02-13 NOTE — Transfer of Care (Signed)
Immediate Anesthesia Transfer of Care Note  Patient: Ricardo Fisher  Procedure(s) Performed: UPPER ENDOSCOPIC ULTRASOUND (EUS) RADIAL ESOPHAGOGASTRODUODENOSCOPY (EGD) BIOPSY  Patient Location: Endoscopy Unit  Anesthesia Type:MAC  Level of Consciousness: awake and patient cooperative  Airway & Oxygen Therapy: Patient Spontanous Breathing and Patient connected to face mask  Post-op Assessment: Report given to RN and Post -op Vital signs reviewed and stable  Post vital signs: Reviewed and stable  Last Vitals:  Vitals Value Taken Time  BP    Temp    Pulse 65 02/13/22 1317  Resp 22 02/13/22 1317  SpO2 100 % 02/13/22 1317  Vitals shown include unvalidated device data.  Last Pain:  Vitals:   02/13/22 1145  TempSrc: Temporal  PainSc: 0-No pain         Complications: No notable events documented.

## 2022-02-13 NOTE — Anesthesia Procedure Notes (Addendum)
Procedure Name: MAC Date/Time: 02/13/2022 12:21 PM  Performed by: West Pugh, CRNAPre-anesthesia Checklist: Patient identified, Emergency Drugs available, Suction available, Patient being monitored and Timeout performed Patient Re-evaluated:Patient Re-evaluated prior to induction Oxygen Delivery Method: Simple face mask Preoxygenation: Pre-oxygenation with 100% oxygen Placement Confirmation: positive ETCO2 Comments: pom

## 2022-02-13 NOTE — Op Note (Signed)
[facility] Patient Name: Ricardo Fisher Procedure Date: 02/13/2022 MRN: [id number] Attending MD: Justice Britain , MD, [id number] Date of Birth: 1955-08-22 CSN: [id number] Age: 66 Admit Type: Outpatient Procedure:                Upper EUS Indications:              Common bile duct dilation (acquired) seen on MRCP,                            Abnormal abdominal MRI, Elevated CA 19-9,                            Epigastric abdominal pain, Abdominal pain in the                            right upper quadrant Providers:                Justice Britain, MD, Doristine Johns, RN, [facility] Technician, Technician, Cletis Athens,                            Technician, Brien Mates, RNFA Referring MD:             Gatha Mayer, MD, Alonza Bogus PA, PA, Kathlene November,                            MD Medicines:                Monitored Anesthesia Care Complications:            No immediate complications. Estimated Blood Loss:     Estimated blood loss was minimal. Procedure:                Pre-Anesthesia Assessment:                           - Prior to the procedure, a History and Physical                            was performed, and patient medications and                            allergies were reviewed. The patient's tolerance of                            previous anesthesia was also reviewed. The risks                            and benefits of the procedure and the sedation                            options and risks were discussed with the patient.  All questions were answered, and informed consent                            was obtained. Prior Anticoagulants: The patient has                            taken no anticoagulant or antiplatelet agents. ASA                            Grade Assessment: III - A patient with severe                            systemic disease. After reviewing the risks and                             benefits, the patient was deemed in satisfactory                            condition to undergo the procedure.                           After obtaining informed consent, the endoscope was                            passed under direct vision. Throughout the                            procedure, the patient's blood pressure, pulse, and                            oxygen saturations were monitored continuously. The                            [id number]-H190 ([id number]) Olympus endoscope was introduced                            through the mouth, and advanced to the second part                            of duodenum. The [id number]-[id number] ([id number]) Olympus                            duodenoscope was introduced through the mouth, and                            advanced to the area of papilla. The [id number]                            ([id number]) Olympus linear ultrasound scope was                            introduced through the mouth, and advanced to the  duodenum for ultrasound examination from the                            stomach and duodenum. The upper EUS was                            accomplished without difficulty. The patient                            tolerated the procedure. Scope In: Scope Out: Findings:      ENDOSCOPIC FINDING: :      No gross lesions were noted in the entire esophagus.      The Z-line was irregular and was found 42 cm from the incisors.      A 1 cm hiatal hernia was present.      Multiple dispersed small erosions with no bleeding and no stigmata of       recent bleeding were found in the gastric body, at the incisura and in       the gastric antrum.      Diffuse granular mucosa was found in the entire examined stomach - query       portal hypertensive gastropathy. Biopsies were taken with a cold forceps       for histology and Helicobacter pylori testing.      No gross lesions were noted in the duodenal bulb, in the first portion       of  the duodenum and in the second portion of the duodenum. Biopsies were       taken with a cold forceps for histology.      The major papilla was normal and hidden under a hood.      ENDOSONOGRAPHIC FINDING: :      Pancreatic parenchymal abnormalities were noted in the entire pancreas.       These consisted of hyperechoic foci with shadowing and hyperechoic       strands.      The pancreatic duct had a dilated endosonographic appearance in the       pancreatic head (1.9 mm to 3.4 mm), genu of the pancreas (3.8 mm), body       of the pancreas (3.6 mm) and tail of the pancreas (3.1 mm to 1.8 mm).       The uncinate pancreas duct was normal in appearance but overall there       appeared to be prominence of the sidebranches throughout.      Multiple stones were visualized endosonographically in the gallbladder       and moderate amount of sludge. The stones were round. They were       hyperechoic and characterized by shadowing.      There was dilation in the common bile duct (2.4 mm to 7.7 mm to 9.5 mm)       and in the common hepatic duct (12.9 mm).      Endosonographic imaging of the ampulla showed no intramural       (subepithelial) lesion.      Endosonographic imaging in the visualized portion of the liver showed no       mass.      No malignant-appearing lymph nodes were visualized in the celiac region       (level 20), peripancreatic region and porta hepatis region.      The celiac  region was visualized. Impression:               EGD impression:                           - No gross lesions in the entire esophagus. Z-line                            irregular, 42 cm from the incisors.                           - 1 cm hiatal hernia.                           - Erosive gastropathy with no bleeding and no                            stigmata of recent bleeding in the antrum/incisura.                           - Diffuse granular gastric mucosa - query portal                             gastropathy. Biopsied.                           - No gross lesions in the duodenal bulb, in the                            first portion of the duodenum and in the second                            portion of the duodenum. Biopsied.                           - Normal major papilla hidden under a hood.                           EUS impression:                           - Pancreatic parenchymal abnormalities consisting                            of hyperechoic foci and hyperechoic strands were                            noted in the entire pancreas.                           - The pancreatic duct had a dilated endosonographic                            appearance and prominent sidebranches throughout.                           -  No evidence of any pancreatic mass or lesion was                            noted.                           - No ampullary mass or lesion noted.                           - Multiple stones including were visualized                            endosonographically in the gallbladder.                           - There was dilation in the common bile duct and in                            the common hepatic duct.                           - No malignant-appearing lymph nodes were                            visualized in the celiac region (level 20),                            peripancreatic region and porta hepatis region. Moderate Sedation:      Not Applicable - Patient had care per Anesthesia. Recommendation:           - The patient will be observed post-procedure,                            until all discharge criteria are met.                           - Discharge patient to home.                           - Patient has a contact number available for                            emergencies. The signs and symptoms of potential                            delayed complications were discussed with the                            patient. Return to normal activities  tomorrow.                            Written discharge instructions were provided to the                            patient.                           -  Low fat diet.                           - Observe patient's clinical course.                           - Await path results.                           - Based on [place], the patient meets criteria for                            diagnosis of chronic pancreatitis.                           - Consider fecal elastase testing (though patient                            notes his bowels are regular) to see if there is a                            insufficiency or not.                           - Recommend repeat imaging with a pancreas protocol                            CT abdomen in 3 months with repeat CA 19?"9.                           - I am not sure that an ERCP is indicated at this                            point without liver test abnormalities but if at                            any time point the liver tests rise, would                            certainly consider the role of ERCP in the setting                            of him having known biliary sludge and gallstones.                           - May be reasonable to consider whether he may be a                            candidate for cholecystectomy in the setting of his                            abdominal discomfort.                           -  Recommend for 1 month doing PPI twice daily and                            Carafate twice daily and then decreasing back down                            to once daily PPI.                           - Follow up with Dr. Carlean Purl in near future.                           - Recommendations were discussed with the patient.                           - The findings and recommendations were discussed                            with the patient's family. Procedure Code(s):        --- Professional ---                           [phone],  Esophagogastroduodenoscopy, flexible,                            transoral; with endoscopic ultrasound examination                            limited to the esophagus, stomach or duodenum, and                            adjacent structures                           43239, Esophagogastroduodenoscopy, flexible,                            transoral; with biopsy, single or multiple Diagnosis Code(s):        --- Professional ---                           K22.89, Other specified disease of esophagus                           K44.9, Diaphragmatic hernia without obstruction or                            gangrene                           K31.89, Other diseases of stomach and duodenum                           K86.9, Disease of pancreas, unspecified  K80.20, Calculus of gallbladder without                            cholecystitis without obstruction                           K83.8, Other specified diseases of biliary tract                           I89.9, Noninfective disorder of lymphatic vessels                            and lymph nodes, unspecified                           R97.8, Other abnormal tumor markers                           R10.13, Epigastric pain                           R10.11, Right upper quadrant pain                           R93.3, Abnormal findings on diagnostic imaging of                            other parts of digestive tract                           R93.5, Abnormal findings on diagnostic imaging of                            other abdominal regions, including retroperitoneum CPT copyright 2022 [employer]. All rights reserved. The codes documented in this report are preliminary and upon coder review may  be revised to meet current compliance requirements. Justice Britain, MD 02/13/2022 1:39:36 PM Number of Addenda: 0

## 2022-02-14 ENCOUNTER — Other Ambulatory Visit (HOSPITAL_BASED_OUTPATIENT_CLINIC_OR_DEPARTMENT_OTHER): Payer: Self-pay

## 2022-02-14 LAB — SURGICAL PATHOLOGY

## 2022-02-16 ENCOUNTER — Encounter (HOSPITAL_COMMUNITY): Payer: Self-pay | Admitting: Gastroenterology

## 2022-02-16 ENCOUNTER — Encounter: Payer: Self-pay | Admitting: Gastroenterology

## 2022-02-17 ENCOUNTER — Other Ambulatory Visit: Payer: Self-pay

## 2022-02-19 DIAGNOSIS — F1120 Opioid dependence, uncomplicated: Secondary | ICD-10-CM | POA: Diagnosis not present

## 2022-02-24 ENCOUNTER — Other Ambulatory Visit (HOSPITAL_BASED_OUTPATIENT_CLINIC_OR_DEPARTMENT_OTHER): Payer: Self-pay

## 2022-02-25 ENCOUNTER — Other Ambulatory Visit (HOSPITAL_BASED_OUTPATIENT_CLINIC_OR_DEPARTMENT_OTHER): Payer: Self-pay

## 2022-02-26 ENCOUNTER — Telehealth: Payer: Self-pay | Admitting: Internal Medicine

## 2022-02-26 NOTE — Telephone Encounter (Signed)
[facility] rep called stating that pt referral to their office is considered out of network and needs to be rerouted to be in network with [facility].

## 2022-02-27 ENCOUNTER — Ambulatory Visit: Payer: PPO | Admitting: Internal Medicine

## 2022-02-27 NOTE — Telephone Encounter (Signed)
Reviewed referrals for pt and was unable to locate [facility] referral. Attempted to call back on number recorded and was connected to [place]. Unable to locate office where referral was sent to.

## 2022-03-05 DIAGNOSIS — F1120 Opioid dependence, uncomplicated: Secondary | ICD-10-CM | POA: Diagnosis not present

## 2022-03-10 ENCOUNTER — Other Ambulatory Visit (HOSPITAL_BASED_OUTPATIENT_CLINIC_OR_DEPARTMENT_OTHER): Payer: Self-pay

## 2022-03-11 ENCOUNTER — Other Ambulatory Visit (HOSPITAL_BASED_OUTPATIENT_CLINIC_OR_DEPARTMENT_OTHER): Payer: Self-pay

## 2022-03-11 MED ORDER — FLUAD QUADRIVALENT 0.5 ML IM PRSY
PREFILLED_SYRINGE | INTRAMUSCULAR | 0 refills | Status: DC
  Filled 2022-03-11: qty 0.5, 1d supply, fill #0

## 2022-03-12 ENCOUNTER — Telehealth: Payer: Self-pay | Admitting: Internal Medicine

## 2022-03-12 ENCOUNTER — Other Ambulatory Visit (HOSPITAL_BASED_OUTPATIENT_CLINIC_OR_DEPARTMENT_OTHER): Payer: Self-pay

## 2022-03-12 ENCOUNTER — Other Ambulatory Visit: Payer: Self-pay

## 2022-03-12 NOTE — Telephone Encounter (Signed)
error 

## 2022-03-17 ENCOUNTER — Ambulatory Visit (INDEPENDENT_AMBULATORY_CARE_PROVIDER_SITE_OTHER): Payer: PPO | Admitting: Internal Medicine

## 2022-03-17 ENCOUNTER — Encounter: Payer: Self-pay | Admitting: Internal Medicine

## 2022-03-17 VITALS — BP 132/70 | HR 69 | Temp 97.5°F | Resp 18 | Ht 69.0 in | Wt 162.5 lb

## 2022-03-17 DIAGNOSIS — L299 Pruritus, unspecified: Secondary | ICD-10-CM | POA: Diagnosis not present

## 2022-03-17 DIAGNOSIS — R5383 Other fatigue: Secondary | ICD-10-CM | POA: Diagnosis not present

## 2022-03-17 DIAGNOSIS — R1013 Epigastric pain: Secondary | ICD-10-CM | POA: Diagnosis not present

## 2022-03-17 DIAGNOSIS — R7989 Other specified abnormal findings of blood chemistry: Secondary | ICD-10-CM

## 2022-03-17 DIAGNOSIS — R634 Abnormal weight loss: Secondary | ICD-10-CM | POA: Diagnosis not present

## 2022-03-17 DIAGNOSIS — R21 Rash and other nonspecific skin eruption: Secondary | ICD-10-CM | POA: Diagnosis not present
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
## 2022-03-17 NOTE — Patient Instructions (Addendum)
Please come back tomorrow or the next day for blood work.  Make an appointment  Schedule office visit in 2 months from today  Stop taking vitamin D supplements

## 2022-03-18 ENCOUNTER — Other Ambulatory Visit (INDEPENDENT_AMBULATORY_CARE_PROVIDER_SITE_OTHER): Payer: PPO

## 2022-03-18 DIAGNOSIS — L299 Pruritus, unspecified: Secondary | ICD-10-CM | POA: Diagnosis not present

## 2022-03-18 DIAGNOSIS — R634 Abnormal weight loss: Secondary | ICD-10-CM

## 2022-03-18 DIAGNOSIS — R7989 Other specified abnormal findings of blood chemistry: Secondary | ICD-10-CM

## 2022-03-18 NOTE — Assessment & Plan Note (Signed)
Since the last visit: US abdomen: - Infrarenal abdominal aortic aneurysm.    - Cholelithiasis - Dilated common bile duct. MRCP 12/10/2021: Diffuse bilaterally ductal dilatation.  Focal narrowing of the distal common bile duct. Infrarenal aneurysm confirmed. Saw GI, upper endoscopy ultrasound:  They found erosive gastropathy, also Endoscopy interpretation show chronic pancreatitis.  No mass.  Dilatation of the bile ducts probably related to methadone use. Was recommended to redo a CT pancreas protocol and a CA 19-9 by the end of January 2024. Continue with multiple symptoms: Fatigue, R sided abdominal pain, depression/stress (see LOV) Has developed generalized itching and a rash, see pictures. Glossitis finally improved.  He saw ENT. He thinks rash started after he started sucralfate.   It is hard to find a unifying diagnosis to explain all of the above. Plan: -LFTs, amylase lipase, TSH, CBC with smear, iron ferritin (generalized itching: Hemochromatosis?). - Last B12 normal but will check a folic acid and homocystine -Vitamin D was elevated recommend to stop vitamin D supplements. -As far as a rash, if he is truly allergic to sucralfate should improve after it is hold for 2 weeks.  Given multitude of symptoms will also refer to dermatology, BX?Marland Kitchen  Aortic enlargement: saw surgery RTC 2 months

## 2022-03-19 ENCOUNTER — Other Ambulatory Visit: Payer: Self-pay | Admitting: Internal Medicine

## 2022-03-19 ENCOUNTER — Other Ambulatory Visit (HOSPITAL_BASED_OUTPATIENT_CLINIC_OR_DEPARTMENT_OTHER): Payer: Self-pay

## 2022-03-19 DIAGNOSIS — F1120 Opioid dependence, uncomplicated: Secondary | ICD-10-CM | POA: Diagnosis not present

## 2022-03-19 LAB — CBC WITH DIFFERENTIAL/PLATELET
Absolute Monocytes: 624 cells/uL (ref 200–950)
Basophils Absolute: 40 cells/uL (ref 0–200)
Basophils Relative: 0.5 %
Eosinophils Absolute: 112 cells/uL (ref 15–500)
Eosinophils Relative: 1.4 %
HCT: 38.9 % (ref 38.5–50.0)
Hemoglobin: 13.4 g/dL (ref 13.2–17.1)
Lymphs Abs: 1264 cells/uL (ref 850–3900)
MCH: 32.9 pg (ref 27.0–33.0)
MCHC: 34.4 g/dL (ref 32.0–36.0)
MCV: 95.6 fL (ref 80.0–100.0)
MPV: 10 fL (ref 7.5–12.5)
Monocytes Relative: 7.8 %
Neutro Abs: 5960 cells/uL (ref 1500–7800)
Neutrophils Relative %: 74.5 %
Platelets: 187 10*3/uL (ref 140–400)
RBC: 4.07 10*6/uL — ABNORMAL LOW (ref 4.20–5.80)
RDW: 12 % (ref 11.0–15.0)
Total Lymphocyte: 15.8 %
WBC: 8 10*3/uL (ref 3.8–10.8)

## 2022-03-19 LAB — HEPATIC FUNCTION PANEL
AG Ratio: 1.3 (calc) (ref 1.0–2.5)
ALT: 16 U/L (ref 9–46)
AST: 22 U/L (ref 10–35)
Albumin: 4 g/dL (ref 3.6–5.1)
Alkaline phosphatase (APISO): 66 U/L (ref 35–144)
Bilirubin, Direct: 0.1 mg/dL (ref 0.0–0.2)
Globulin: 3 g/dL (calc) (ref 1.9–3.7)
Indirect Bilirubin: 0.2 mg/dL (calc) (ref 0.2–1.2)
Total Bilirubin: 0.3 mg/dL (ref 0.2–1.2)
Total Protein: 7 g/dL (ref 6.1–8.1)

## 2022-03-19 LAB — PATHOLOGIST SMEAR REVIEW

## 2022-03-19 LAB — TSH: TSH: 2.15 mIU/L (ref 0.40–4.50)

## 2022-03-19 LAB — LIPASE: Lipase: 69 U/L — ABNORMAL HIGH (ref 7–60)

## 2022-03-19 LAB — AMYLASE: Amylase: 130 U/L — ABNORMAL HIGH (ref 21–101)

## 2022-03-19 LAB — IRON: Iron: 70 ug/dL (ref 50–180)

## 2022-03-19 LAB — FERRITIN: Ferritin: 173 ng/mL (ref 24–380)

## 2022-03-19 MED ORDER — BETAMETHASONE DIPROPIONATE AUG 0.05 % EX CREA
TOPICAL_CREAM | Freq: Two times a day (BID) | CUTANEOUS | 0 refills | Status: DC
  Filled 2022-03-19: qty 30, 30d supply, fill #0

## 2022-03-20 ENCOUNTER — Ambulatory Visit: Payer: PPO | Admitting: Internal Medicine

## 2022-03-31 ENCOUNTER — Other Ambulatory Visit (HOSPITAL_BASED_OUTPATIENT_CLINIC_OR_DEPARTMENT_OTHER): Payer: Self-pay

## 2022-04-01 ENCOUNTER — Encounter: Payer: Self-pay | Admitting: Internal Medicine

## 2022-04-01 ENCOUNTER — Other Ambulatory Visit (HOSPITAL_BASED_OUTPATIENT_CLINIC_OR_DEPARTMENT_OTHER): Payer: Self-pay

## 2022-04-01 ENCOUNTER — Telehealth: Payer: Self-pay

## 2022-04-01 ENCOUNTER — Ambulatory Visit (INDEPENDENT_AMBULATORY_CARE_PROVIDER_SITE_OTHER): Payer: PPO | Admitting: Internal Medicine

## 2022-04-01 VITALS — BP 132/76 | HR 76 | Ht 69.0 in | Wt 158.0 lb

## 2022-04-01 DIAGNOSIS — R12 Heartburn: Secondary | ICD-10-CM | POA: Diagnosis not present

## 2022-04-01 DIAGNOSIS — F1120 Opioid dependence, uncomplicated: Secondary | ICD-10-CM | POA: Diagnosis not present

## 2022-04-01 DIAGNOSIS — R1011 Right upper quadrant pain: Secondary | ICD-10-CM | POA: Diagnosis not present

## 2022-04-01 DIAGNOSIS — K8020 Calculus of gallbladder without cholecystitis without obstruction: Secondary | ICD-10-CM | POA: Diagnosis not present

## 2022-04-01 DIAGNOSIS — I7143 Infrarenal abdominal aortic aneurysm, without rupture: Secondary | ICD-10-CM

## 2022-04-01 DIAGNOSIS — K8689 Other specified diseases of pancreas: Secondary | ICD-10-CM

## 2022-04-01 DIAGNOSIS — K838 Other specified diseases of biliary tract: Secondary | ICD-10-CM

## 2022-04-01 DIAGNOSIS — F1190 Opioid use, unspecified, uncomplicated: Secondary | ICD-10-CM

## 2022-04-01 MED ORDER — PANTOPRAZOLE SODIUM 40 MG PO TBEC
40.0000 mg | DELAYED_RELEASE_TABLET | Freq: Every day | ORAL | 1 refills | Status: DC
  Filled 2022-04-01 – 2022-04-22 (×2): qty 90, 90d supply, fill #0
  Filled 2022-11-14: qty 90, 90d supply, fill #1
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
## 2022-04-01 NOTE — Patient Instructions (Signed)
Stop your generic Carafate per Dr Carlean Purl.  Take pantoprazole as follows: one pill daily 30 minutes to an hour before eating.  Come in early March 2024 to have blood drawn. No appointment needed. The lab is open 8-5 Mon-Fri.   I appreciate the opportunity to care for you. Silvano Rusk, MD

## 2022-04-01 NOTE — Telephone Encounter (Signed)
Referral placed in epic.

## 2022-04-01 NOTE — Telephone Encounter (Signed)
-----   Message from Gatha Mayer, MD sent at 04/01/2022  1:32 PM EST ----- Regarding: Vascular surgery referral Please refer him to vascular surgery regarding infrarenal abdominal aortic aneurysm I will let the patient know we are doing this by MyChart  Thanx  CEG

## 2022-04-02 ENCOUNTER — Other Ambulatory Visit (HOSPITAL_BASED_OUTPATIENT_CLINIC_OR_DEPARTMENT_OTHER): Payer: Self-pay

## 2022-04-02 DIAGNOSIS — F1120 Opioid dependence, uncomplicated: Secondary | ICD-10-CM | POA: Diagnosis not present

## 2022-04-08 ENCOUNTER — Other Ambulatory Visit: Payer: Self-pay | Admitting: Internal Medicine

## 2022-04-08 ENCOUNTER — Other Ambulatory Visit (HOSPITAL_BASED_OUTPATIENT_CLINIC_OR_DEPARTMENT_OTHER): Payer: Self-pay

## 2022-04-08 MED ORDER — EZETIMIBE 10 MG PO TABS
10.0000 mg | ORAL_TABLET | Freq: Every day | ORAL | 1 refills | Status: DC
  Filled 2022-04-08 – 2022-05-27 (×2): qty 90, 90d supply, fill #0
  Filled 2022-11-14: qty 90, 90d supply, fill #1

## 2022-04-09 ENCOUNTER — Other Ambulatory Visit (HOSPITAL_BASED_OUTPATIENT_CLINIC_OR_DEPARTMENT_OTHER): Payer: Self-pay

## 2022-04-10 ENCOUNTER — Other Ambulatory Visit (HOSPITAL_BASED_OUTPATIENT_CLINIC_OR_DEPARTMENT_OTHER): Payer: Self-pay

## 2022-04-11 ENCOUNTER — Other Ambulatory Visit (HOSPITAL_BASED_OUTPATIENT_CLINIC_OR_DEPARTMENT_OTHER): Payer: Self-pay

## 2022-04-15 ENCOUNTER — Other Ambulatory Visit (HOSPITAL_BASED_OUTPATIENT_CLINIC_OR_DEPARTMENT_OTHER): Payer: Self-pay

## 2022-04-16 ENCOUNTER — Other Ambulatory Visit (HOSPITAL_BASED_OUTPATIENT_CLINIC_OR_DEPARTMENT_OTHER): Payer: Self-pay

## 2022-04-16 ENCOUNTER — Other Ambulatory Visit: Payer: Self-pay

## 2022-04-16 DIAGNOSIS — F1120 Opioid dependence, uncomplicated: Secondary | ICD-10-CM | POA: Diagnosis not present

## 2022-04-17 ENCOUNTER — Other Ambulatory Visit (HOSPITAL_BASED_OUTPATIENT_CLINIC_OR_DEPARTMENT_OTHER): Payer: Self-pay

## 2022-04-22 ENCOUNTER — Other Ambulatory Visit (HOSPITAL_BASED_OUTPATIENT_CLINIC_OR_DEPARTMENT_OTHER): Payer: Self-pay

## 2022-04-22 ENCOUNTER — Other Ambulatory Visit: Payer: Self-pay | Admitting: Internal Medicine

## 2022-04-22 ENCOUNTER — Other Ambulatory Visit (HOSPITAL_COMMUNITY): Payer: Self-pay

## 2022-04-22 MED ORDER — METOPROLOL SUCCINATE ER 25 MG PO TB24
12.5000 mg | ORAL_TABLET | Freq: Every day | ORAL | 1 refills | Status: DC
  Filled 2022-04-22: qty 45, 90d supply, fill #0
  Filled 2022-07-17: qty 45, 90d supply, fill #1

## 2022-04-30 DIAGNOSIS — F1120 Opioid dependence, uncomplicated: Secondary | ICD-10-CM | POA: Diagnosis not present

## 2022-05-02 NOTE — Telephone Encounter (Signed)
Recall in epic for February for the vascular office.

## 2022-05-07 ENCOUNTER — Other Ambulatory Visit (HOSPITAL_COMMUNITY): Payer: Self-pay

## 2022-05-07 ENCOUNTER — Other Ambulatory Visit (HOSPITAL_BASED_OUTPATIENT_CLINIC_OR_DEPARTMENT_OTHER): Payer: Self-pay

## 2022-05-12 ENCOUNTER — Other Ambulatory Visit (HOSPITAL_BASED_OUTPATIENT_CLINIC_OR_DEPARTMENT_OTHER): Payer: Self-pay

## 2022-05-14 ENCOUNTER — Other Ambulatory Visit (HOSPITAL_BASED_OUTPATIENT_CLINIC_OR_DEPARTMENT_OTHER): Payer: Self-pay

## 2022-05-14 DIAGNOSIS — F1120 Opioid dependence, uncomplicated: Secondary | ICD-10-CM | POA: Diagnosis not present

## 2022-05-16 ENCOUNTER — Other Ambulatory Visit (HOSPITAL_BASED_OUTPATIENT_CLINIC_OR_DEPARTMENT_OTHER): Payer: Self-pay

## 2022-05-19 ENCOUNTER — Other Ambulatory Visit (HOSPITAL_COMMUNITY): Payer: Self-pay

## 2022-05-19 ENCOUNTER — Ambulatory Visit (INDEPENDENT_AMBULATORY_CARE_PROVIDER_SITE_OTHER): Payer: PPO | Admitting: Internal Medicine

## 2022-05-19 ENCOUNTER — Encounter: Payer: Self-pay | Admitting: Internal Medicine

## 2022-05-19 ENCOUNTER — Other Ambulatory Visit (HOSPITAL_BASED_OUTPATIENT_CLINIC_OR_DEPARTMENT_OTHER): Payer: Self-pay

## 2022-05-19 ENCOUNTER — Ambulatory Visit: Payer: PPO | Admitting: Internal Medicine

## 2022-05-19 VITALS — BP 122/74 | HR 68 | Temp 98.1°F | Resp 16 | Ht 69.0 in | Wt 157.2 lb

## 2022-05-19 DIAGNOSIS — R748 Abnormal levels of other serum enzymes: Secondary | ICD-10-CM

## 2022-05-19 DIAGNOSIS — R933 Abnormal findings on diagnostic imaging of other parts of digestive tract: Secondary | ICD-10-CM | POA: Diagnosis not present

## 2022-05-19 DIAGNOSIS — E673 Hypervitaminosis D: Secondary | ICD-10-CM

## 2022-05-19 LAB — COMPREHENSIVE METABOLIC PANEL
ALT: 11 U/L (ref 0–53)
AST: 18 U/L (ref 0–37)
Albumin: 4.2 g/dL (ref 3.5–5.2)
Alkaline Phosphatase: 45 U/L (ref 39–117)
BUN: 20 mg/dL (ref 6–23)
CO2: 32 mEq/L (ref 19–32)
Calcium: 9.4 mg/dL (ref 8.4–10.5)
Chloride: 99 mEq/L (ref 96–112)
Creatinine, Ser: 1.05 mg/dL (ref 0.40–1.50)
GFR: 73.92 mL/min (ref >60.00)
Glucose, Bld: 93 mg/dL (ref 70–99)
Potassium: 5.3 mEq/L — ABNORMAL HIGH (ref 3.5–5.1)
Sodium: 137 mEq/L (ref 135–145)
Total Bilirubin: 0.4 mg/dL (ref 0.2–1.2)
Total Protein: 7.3 g/dL (ref 6.0–8.3)

## 2022-05-19 LAB — LIPASE: Lipase: 70 U/L — ABNORMAL HIGH (ref 11.0–59.0)

## 2022-05-19 LAB — AMYLASE: Amylase: 143 U/L — ABNORMAL HIGH (ref 27–131)

## 2022-05-19 LAB — VITAMIN D 25 HYDROXY (VIT D DEFICIENCY, FRACTURES): VITD: 72.89 ng/mL (ref 30.00–100.00)

## 2022-05-19 MED ORDER — METHADONE HCL 5 MG PO TABS: 5.0000 mg | ORAL_TABLET | Freq: Every day | ORAL | Status: DC

## 2022-05-19 NOTE — Telephone Encounter (Signed)
I see appointment has been made for 06/11/22 with vascular office.

## 2022-05-19 NOTE — Patient Instructions (Addendum)
Vaccines I recommend:  Covid booster RSV vaccine Shingrix (shingles) #2  For skin dryness of the legs: Use Aveeno over-the-counter moisturizing lotion twice daily.  GO TO THE LAB : Get the blood work     Leonore, PLEASE SCHEDULE YOUR APPOINTMENTS Come back for a physical exam by May 2024
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
## 2022-05-20 ENCOUNTER — Other Ambulatory Visit: Payer: PPO

## 2022-05-20 ENCOUNTER — Telehealth: Payer: Self-pay

## 2022-05-20 DIAGNOSIS — R933 Abnormal findings on diagnostic imaging of other parts of digestive tract: Secondary | ICD-10-CM | POA: Diagnosis not present

## 2022-05-20 NOTE — Telephone Encounter (Signed)
Personal reminder placed in Epic set up a CA 19-9 in 3 months with a pancreas protocol CT abdomen to be performed

## 2022-05-20 NOTE — Assessment & Plan Note (Signed)
Multiple symptoms, see LOV: Since the last visit, blood work was okay except for his lipase was slightly elevated.  Pathology review of CBC was negative except for normocytic anemia but hemoglobin, iron and ferritin were normal Overall better with less noticeable fatigue, abdominal pain now being only intermittent, itching resolved. He did report some "rash" at the lower extremity, it has no particular pattern, suspect this is rather dry skin thus  recommend Aveeno. Abnormal MRCP: Noted diffuse bilateral ductal dilatation on MRCP August 2023, per GI notes plan was to do a CT pancreas protocol and a CA 19-9 by the end of January 2024 Amylase lipase were slightly elevated inguina check it few months ago.  Plan: CMP amylase lipase. Abdominal aortic aneurysm: Incidental finding on MRI 12/10/2021.  4.9 cm, has been referred to vascular by GI. Elevated vitamin D: Was advised to stop supplements.  Rechecking today. Hypogonadism: Unable to take a full dose of the testosterone (the brand he can tolerate is not available) consequently won't check levels today. Preventive care: Vaccine advice provided. RTC 4 months CPX

## 2022-05-20 NOTE — Telephone Encounter (Signed)
Message Received: Today Timothy Lasso, RN  Gillermina Hu, RN  Please set up a CA 19-9 in 3 months with a pancreas protocol CT abdomen to be performed.  These can be put under Dr. Celesta Aver name.

## 2022-05-21 ENCOUNTER — Telehealth: Payer: Self-pay | Admitting: Internal Medicine

## 2022-05-21 LAB — CANCER ANTIGEN 19-9: CA 19-9: 15 U/mL (ref <34)

## 2022-05-23 ENCOUNTER — Other Ambulatory Visit (HOSPITAL_BASED_OUTPATIENT_CLINIC_OR_DEPARTMENT_OTHER): Payer: Self-pay

## 2022-05-23 ENCOUNTER — Other Ambulatory Visit: Payer: Self-pay

## 2022-05-23 ENCOUNTER — Other Ambulatory Visit (HOSPITAL_COMMUNITY): Payer: Self-pay

## 2022-05-26 ENCOUNTER — Other Ambulatory Visit (HOSPITAL_BASED_OUTPATIENT_CLINIC_OR_DEPARTMENT_OTHER): Payer: Self-pay

## 2022-05-27 ENCOUNTER — Encounter (HOSPITAL_BASED_OUTPATIENT_CLINIC_OR_DEPARTMENT_OTHER): Payer: Self-pay

## 2022-05-27 ENCOUNTER — Other Ambulatory Visit (HOSPITAL_BASED_OUTPATIENT_CLINIC_OR_DEPARTMENT_OTHER): Payer: Self-pay

## 2022-05-27 ENCOUNTER — Other Ambulatory Visit: Payer: Self-pay | Admitting: Internal Medicine

## 2022-05-27 NOTE — Telephone Encounter (Signed)
error 

## 2022-05-27 NOTE — Telephone Encounter (Signed)
Refill request for augmented betamethasone 0.05% cream. No longer on med list. Please advise?

## 2022-05-28 ENCOUNTER — Other Ambulatory Visit (HOSPITAL_BASED_OUTPATIENT_CLINIC_OR_DEPARTMENT_OTHER): Payer: Self-pay

## 2022-05-28 DIAGNOSIS — F1120 Opioid dependence, uncomplicated: Secondary | ICD-10-CM | POA: Diagnosis not present

## 2022-05-28 NOTE — Telephone Encounter (Signed)
betamethasone  is not a chronic medication. If he has a new rash needs to be seen. If is a minor rash may like to take OTC hydrocortisone 1%.

## 2022-05-29 ENCOUNTER — Other Ambulatory Visit (HOSPITAL_BASED_OUTPATIENT_CLINIC_OR_DEPARTMENT_OTHER): Payer: Self-pay

## 2022-05-30 ENCOUNTER — Other Ambulatory Visit (HOSPITAL_BASED_OUTPATIENT_CLINIC_OR_DEPARTMENT_OTHER): Payer: Self-pay

## 2022-06-05 DIAGNOSIS — F1120 Opioid dependence, uncomplicated: Secondary | ICD-10-CM | POA: Diagnosis not present

## 2022-06-11 ENCOUNTER — Ambulatory Visit (HOSPITAL_COMMUNITY)
Admission: RE | Admit: 2022-06-11 | Discharge: 2022-06-11 | Disposition: A | Discharge status: Home / Self Care | Payer: PPO | Source: Ambulatory Visit | Attending: Vascular Surgery | Admitting: Vascular Surgery

## 2022-06-11 ENCOUNTER — Encounter: Payer: Self-pay | Admitting: Vascular Surgery

## 2022-06-11 ENCOUNTER — Ambulatory Visit: Payer: PPO | Admitting: Vascular Surgery

## 2022-06-11 VITALS — BP 116/77 | HR 69 | Temp 98.2°F | Resp 20 | Ht 69.0 in | Wt 155.0 lb

## 2022-06-11 DIAGNOSIS — I7143 Infrarenal abdominal aortic aneurysm, without rupture: Secondary | ICD-10-CM

## 2022-06-11 DIAGNOSIS — F1120 Opioid dependence, uncomplicated: Secondary | ICD-10-CM | POA: Diagnosis not present
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
## 2022-06-24 ENCOUNTER — Other Ambulatory Visit: Payer: Self-pay | Admitting: Internal Medicine

## 2022-06-24 ENCOUNTER — Other Ambulatory Visit (HOSPITAL_BASED_OUTPATIENT_CLINIC_OR_DEPARTMENT_OTHER): Payer: Self-pay

## 2022-06-24 MED ORDER — TESTOSTERONE 1.62 % TD GEL
3.0000 | Freq: Every day | TRANSDERMAL | 5 refills | Status: DC
  Filled 2022-06-24 – 2022-07-08 (×2): qty 150, 40d supply, fill #0
  Filled 2022-08-15: qty 150, 40d supply, fill #1
  Filled 2022-09-29: qty 150, 40d supply, fill #2
  Filled 2022-11-03 – 2022-11-07 (×3): qty 150, 40d supply, fill #3
  Filled ????-??-??: fill #3

## 2022-06-24 NOTE — Progress Notes (Signed)
Rx sent to [facility] in [facility] per pt  request
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
## 2022-06-25 ENCOUNTER — Other Ambulatory Visit: Payer: Self-pay

## 2022-06-25 DIAGNOSIS — I7143 Infrarenal abdominal aortic aneurysm, without rupture: Secondary | ICD-10-CM

## 2022-06-25 DIAGNOSIS — F1120 Opioid dependence, uncomplicated: Secondary | ICD-10-CM | POA: Diagnosis not present

## 2022-07-02 ENCOUNTER — Other Ambulatory Visit (HOSPITAL_BASED_OUTPATIENT_CLINIC_OR_DEPARTMENT_OTHER): Payer: Self-pay

## 2022-07-03 ENCOUNTER — Ambulatory Visit (HOSPITAL_BASED_OUTPATIENT_CLINIC_OR_DEPARTMENT_OTHER)
Admission: RE | Admit: 2022-07-03 | Discharge: 2022-07-03 | Disposition: A | Discharge status: Home / Self Care | Payer: PPO | Source: Ambulatory Visit | Attending: Vascular Surgery | Admitting: Vascular Surgery

## 2022-07-03 DIAGNOSIS — I7143 Infrarenal abdominal aortic aneurysm, without rupture: Secondary | ICD-10-CM | POA: Diagnosis not present

## 2022-07-03 DIAGNOSIS — I700 Atherosclerosis of aorta: Secondary | ICD-10-CM | POA: Diagnosis not present

## 2022-07-03 MED ORDER — IOHEXOL 350 MG/ML SOLN
125.0000 mL | Freq: Once | INTRAVENOUS | Status: AC | PRN
  Administered 2022-07-03: 125 mL via INTRAVENOUS

## 2022-07-04 ENCOUNTER — Other Ambulatory Visit (HOSPITAL_BASED_OUTPATIENT_CLINIC_OR_DEPARTMENT_OTHER): Payer: PPO

## 2022-07-08 ENCOUNTER — Other Ambulatory Visit (HOSPITAL_BASED_OUTPATIENT_CLINIC_OR_DEPARTMENT_OTHER): Payer: Self-pay

## 2022-07-09 ENCOUNTER — Ambulatory Visit (INDEPENDENT_AMBULATORY_CARE_PROVIDER_SITE_OTHER): Payer: PPO | Admitting: Vascular Surgery

## 2022-07-09 ENCOUNTER — Encounter: Payer: Self-pay | Admitting: Vascular Surgery

## 2022-07-09 VITALS — BP 116/77 | HR 59 | Temp 98.3°F | Resp 20 | Ht 69.0 in | Wt 155.0 lb

## 2022-07-09 DIAGNOSIS — I7143 Infrarenal abdominal aortic aneurysm, without rupture: Secondary | ICD-10-CM

## 2022-07-09 DIAGNOSIS — F1120 Opioid dependence, uncomplicated: Secondary | ICD-10-CM | POA: Diagnosis not present
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
## 2022-07-11 ENCOUNTER — Ambulatory Visit: Payer: PPO | Admitting: Internal Medicine

## 2022-07-17 ENCOUNTER — Other Ambulatory Visit (HOSPITAL_BASED_OUTPATIENT_CLINIC_OR_DEPARTMENT_OTHER): Payer: Self-pay

## 2022-07-23 DIAGNOSIS — F1120 Opioid dependence, uncomplicated: Secondary | ICD-10-CM | POA: Diagnosis not present

## 2022-07-29 ENCOUNTER — Ambulatory Visit: Payer: PPO | Admitting: Gastroenterology

## 2022-07-31 ENCOUNTER — Ambulatory Visit: Payer: PPO | Admitting: Internal Medicine

## 2022-08-05 ENCOUNTER — Encounter: Payer: Self-pay | Admitting: Cardiology

## 2022-08-05 ENCOUNTER — Ambulatory Visit: Payer: PPO | Attending: Cardiology | Admitting: Cardiology

## 2022-08-05 VITALS — BP 138/86 | HR 62 | Ht 70.0 in | Wt 152.0 lb

## 2022-08-05 DIAGNOSIS — I7143 Infrarenal abdominal aortic aneurysm, without rupture: Secondary | ICD-10-CM

## 2022-08-05 DIAGNOSIS — Z01818 Encounter for other preprocedural examination: Secondary | ICD-10-CM | POA: Diagnosis not present

## 2022-08-05 DIAGNOSIS — F17210 Nicotine dependence, cigarettes, uncomplicated: Secondary | ICD-10-CM | POA: Diagnosis not present

## 2022-08-05 DIAGNOSIS — I10 Essential (primary) hypertension: Secondary | ICD-10-CM

## 2022-08-05 DIAGNOSIS — Z01810 Encounter for preprocedural cardiovascular examination: Secondary | ICD-10-CM

## 2022-08-05 DIAGNOSIS — E782 Mixed hyperlipidemia: Secondary | ICD-10-CM
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
## 2022-08-05 NOTE — Patient Instructions (Addendum)
Medication Instructions:   No changes  *If you need a refill on your cardiac medications before your next appointment, please call your pharmacy*   Lab Work: Not needed   Testing/Procedures:  Not needed  Follow-Up: At [facility], you and your health needs are our priority.  As part of our continuing mission to provide you with exceptional heart care, we have created designated Provider Care Teams.  These Care Teams include your primary Cardiologist (physician) and Advanced Practice Providers (APPs -  Physician Assistants and Nurse Practitioners) who all work together to provide you with the care you need, when you need it.     Your next appointment:    4 to 6 month(s)  The format for your next appointment:   In Person  Provider:   Bryan Lemma, MD    Other Instructions    From a  cardaic standpoint okay to proceed with vascular procedure  as long as it is not an possible open procedure

## 2022-08-06 ENCOUNTER — Other Ambulatory Visit: Payer: Self-pay

## 2022-08-06 ENCOUNTER — Telehealth: Payer: Self-pay

## 2022-08-06 DIAGNOSIS — F1120 Opioid dependence, uncomplicated: Secondary | ICD-10-CM | POA: Diagnosis not present

## 2022-08-06 DIAGNOSIS — I7143 Infrarenal abdominal aortic aneurysm, without rupture: Secondary | ICD-10-CM

## 2022-08-06 NOTE — Telephone Encounter (Signed)
Attempted to reach patient to schedule surgery, no answer. LVM for patient to return call.

## 2022-08-10 ENCOUNTER — Encounter: Payer: Self-pay | Admitting: Cardiology

## 2022-08-10 DIAGNOSIS — Z01810 Encounter for preprocedural cardiovascular examination: Secondary | ICD-10-CM | POA: Insufficient documentation

## 2022-08-10 NOTE — Assessment & Plan Note (Addendum)
Discussed importance of smoking cessation.  This is probably the biggest risk factor for his AAA. He does not seem quite interested in stopping yet.  He says he was able to get off of alcohol medications including pain meds but smoking has been much harder.

## 2022-08-10 NOTE — Assessment & Plan Note (Addendum)
Aneurysm seems to be growing more rapid than expected.  Plans now to consider endovascular repair.  Provided this is the planned procedure, it is relatively low risk as far as cardiac complications.  However this is given the converted to an open procedure, then there is more notable risk-perhaps the most notably high risk noncardiac surgery from a car standpoint. He is already on a standing dose of Toprol which I would simply continue with.  His heart rate is 62 beats minute.  If there is a strong consideration of possibly converting to open repair, may not be unreasonable to at least consider noninvasive testing preoperatively, however would likely not change therapy.  As such, the recommendation would be to proceed with AAA repair regardless, without additional evaluation.  We can consider more detailed evaluation of Cardiovascular Risk Stratification and follow-up based on the presence of PAD.

## 2022-08-10 NOTE — Assessment & Plan Note (Signed)
Had previously been on Zocor but no longer on it because of significant myalgias.  He is on Zetia which he is only taking half tablet daily. Last LDL was 96.  With AAA, probably should have a more aggressive management plan.  I think we may want to build try increasing to full dose Zetia plus or minus adding Nexletol.  With his chronic leg otitis issues and biliary issues.  Very leery of being too aggressive with statins although we could consider rosuvastatin at low-dose.

## 2022-08-10 NOTE — Assessment & Plan Note (Addendum)
  Revised Cardiac Risk Index:no history of ischemic heart disease, congestive heart failure, cerebrovascular disease, insulin-dependent diabetes or renal insufficiency with Cr< 2.   For Closed Endovascular AAA Repair-= -> Low Risk Procedure => Class I Risk (~3.9%) Open Endovascular Repair -> High Risk Surgery => Class II Risk (6%)  Based on ACC/AHA screening guidelines-able to achieve more than 8 METS. (>4) => proceed to surgery; => given the absence of any active angina or heart failure symptoms, would proceed to surgery without any additional cardiovascular evaluation.

## 2022-08-10 NOTE — Assessment & Plan Note (Signed)
Blood pressure is borderline elevated on Toprol 12.5 mg daily only.  Not on ARB.  For preop setting beta-blocker is beneficial, but would not be to titrate up prior simply because of his resting heart rate 60 bpm.  He says at home his pressures are usually better.

## 2022-08-15 ENCOUNTER — Other Ambulatory Visit (HOSPITAL_BASED_OUTPATIENT_CLINIC_OR_DEPARTMENT_OTHER): Payer: Self-pay

## 2022-08-18 ENCOUNTER — Other Ambulatory Visit (HOSPITAL_BASED_OUTPATIENT_CLINIC_OR_DEPARTMENT_OTHER): Payer: Self-pay

## 2022-08-20 DIAGNOSIS — F1120 Opioid dependence, uncomplicated: Secondary | ICD-10-CM | POA: Diagnosis not present

## 2022-08-21 ENCOUNTER — Other Ambulatory Visit: Payer: Self-pay

## 2022-08-21 DIAGNOSIS — K8689 Other specified diseases of pancreas: Secondary | ICD-10-CM

## 2022-08-21 DIAGNOSIS — K838 Other specified diseases of biliary tract: Secondary | ICD-10-CM

## 2022-08-21 DIAGNOSIS — K831 Obstruction of bile duct: Secondary | ICD-10-CM

## 2022-08-21 DIAGNOSIS — R933 Abnormal findings on diagnostic imaging of other parts of digestive tract: Secondary | ICD-10-CM

## 2022-08-27 ENCOUNTER — Telehealth: Payer: Self-pay | Admitting: Internal Medicine

## 2022-08-27 NOTE — Telephone Encounter (Signed)
Patient is calling wanting to know if he would be able to come to the lab and get his enzymes check and some routine labs, states he has has some inflammation in his pancreas area. Also said he tested positive for Hep B back in February but believes it was a false positive. Please advise. Says if he doesn't answer to leave a message.

## 2022-08-28 ENCOUNTER — Telehealth: Payer: Self-pay | Admitting: Internal Medicine

## 2022-08-28 NOTE — Telephone Encounter (Signed)
Will defer to gastroenterology.

## 2022-08-28 NOTE — Telephone Encounter (Signed)
Pt called back stating that the Hep B screening is not needed but he is still interested in the enzyme test

## 2022-08-28 NOTE — Telephone Encounter (Signed)
Pt said he received a call from [employer] stating that he had Hepatitis B. He said that he was shocked to hear this. Pt said that he has been feeling feverish but that's really it. Pt would like to see if Dr. Drue Novel can order a lab test to check his liver enzymes and a Hepatitis B Screening. He is supposed to have a stent placed at the end of the month so she wanted to be able to come in today to get the labs done so he has time to discuss with his vascular doctor.

## 2022-08-29 NOTE — Telephone Encounter (Signed)
Refer to alternate phone call message with primary care 08/28/22.

## 2022-08-29 NOTE — Telephone Encounter (Addendum)
I need details - see if we can get the results from health dept - he should be able to get those and send to [place]  Someone had to draw blood for this - we did not

## 2022-08-29 NOTE — Telephone Encounter (Signed)
Left message for patient to call back  

## 2022-08-31 DIAGNOSIS — F1120 Opioid dependence, uncomplicated: Secondary | ICD-10-CM | POA: Diagnosis not present

## 2022-09-02 NOTE — Telephone Encounter (Signed)
Left message for pt to call back  °

## 2022-09-03 NOTE — Telephone Encounter (Signed)
Left message for pt to call back  °

## 2022-09-03 NOTE — Telephone Encounter (Signed)
PT returned call  Please advise

## 2022-09-04 NOTE — Telephone Encounter (Signed)
Spoke with patient & he stated that he called the clinic back regarding Hep B result and spoke with a nurse who told him that he was given a false positive for Hep B. He is scheduled to see Dr. Leone Payor in July & plans to come before then to have his follow up lab work completed. Advised him on when/where to go for labs.

## 2022-09-05 ENCOUNTER — Other Ambulatory Visit: Payer: PPO

## 2022-09-05 DIAGNOSIS — R933 Abnormal findings on diagnostic imaging of other parts of digestive tract: Secondary | ICD-10-CM | POA: Diagnosis not present

## 2022-09-05 DIAGNOSIS — K831 Obstruction of bile duct: Secondary | ICD-10-CM | POA: Diagnosis not present

## 2022-09-08 LAB — CANCER ANTIGEN 19-9: CA 19-9: 16 U/mL (ref <34)

## 2022-09-09 NOTE — Progress Notes (Signed)
Surgical Instructions    Your procedure is scheduled on Friday May 31st.  Report to [facility] Main Entrance "A" at 5:30 A.M., then check in with the Admitting office.  Call this number if you have problems the morning of surgery:  [phone]   If you have any questions prior to your surgery date call [phone]: Open Monday-Friday 8am-4pm If you experience any cold or flu symptoms such as cough, fever, chills, shortness of breath, etc. between now and your scheduled surgery, please notify us at the above number     Remember:  Do not eat or drink after midnight the night before your surgery     Take these medicines the morning of surgery with A SIP OF WATER: ALPRAZolam (XANAX) 1 MG tablet  ezetimibe (ZETIA) 10 MG tablet  metoprolol succinate (TOPROL-XL) 25 MG 24 hr tablet  pantoprazole (PROTONIX) 40 MG tablet    IF NEEDED  valACYclovir (VALTREX) 1000 MG tablet    Consult with the prescriber of your Methadone for instructions on when/if you need to hold for your surgery.   As of today, STOP taking any Aspirin (unless otherwise instructed by your surgeon) Aleve, Naproxen, Ibuprofen, Motrin, Advil, Goody's, BC's, all herbal medications, fish oil, and all vitamins.           Do not wear jewelry. Do not wear lotions, powders, cologne or deodorant. Do not shave 48 hours prior to surgery.  Men may shave face and neck. Do not bring valuables to the hospital. Do not wear nail polish  [facility] is not responsible for any belongings or valuables.    Do NOT Smoke (Tobacco/Vaping)  24 hours prior to your procedure  If you use a CPAP at night, you may bring your mask for your overnight stay.   Contacts, glasses, hearing aids, dentures or partials may not be worn into surgery, please bring cases for these belongings   For patients admitted to the hospital, discharge time will be determined by your treatment team.   Patients discharged the day of surgery will not be allowed to  drive home, and someone needs to stay with them for 24 hours.   SURGICAL WAITING ROOM VISITATION Patients having surgery or a procedure may have no more than 2 support people in the waiting area - these visitors may rotate.   Children under the age of 48 must have an adult with them who is not the patient. If the patient needs to stay at the hospital during part of their recovery, the visitor guidelines for inpatient rooms apply. Pre-op nurse will coordinate an appropriate time for 1 support person to accompany patient in pre-op.  This support person may not rotate.   Please refer to [email or web address] for the visitor guidelines for Inpatients (after your surgery is over and you are in a regular room).    Special instructions:    Oral Hygiene is also important to reduce your risk of infection.  Remember - BRUSH YOUR TEETH THE MORNING OF SURGERY WITH YOUR REGULAR TOOTHPASTE   [facility]- Preparing For Surgery  Before surgery, you can play an important role. Because skin is not sterile, your skin needs to be as free of germs as possible. You can reduce the number of germs on your skin by washing with CHG (chlorahexidine gluconate) Soap before surgery.  CHG is an antiseptic cleaner which kills germs and bonds with the skin to continue killing germs even after washing.     Please do not use if you  have an allergy to CHG or antibacterial soaps. If your skin becomes reddened/irritated stop using the CHG.  Do not shave (including legs and underarms) for at least 48 hours prior to first CHG shower. It is OK to shave your face.  Please follow these instructions carefully.     Shower the NIGHT BEFORE SURGERY and the MORNING OF SURGERY with CHG Soap.   If you chose to wash your hair, wash your hair first as usual with your normal shampoo. After you shampoo, rinse your hair and body thoroughly to remove the shampoo.  Then [employer] and genitals  (private parts) with your normal soap and rinse thoroughly to remove soap.  After that Use CHG Soap as you would any other liquid soap. You can apply CHG directly to the skin and wash gently with a scrungie or a clean washcloth.   Apply the CHG Soap to your body ONLY FROM THE NECK DOWN.  Do not use on open wounds or open sores. Avoid contact with your eyes, ears, mouth and genitals (private parts). Wash Face and genitals (private parts)  with your normal soap.   Wash thoroughly, paying special attention to the area where your surgery will be performed.  Thoroughly rinse your body with warm water from the neck down.  DO NOT shower/wash with your normal soap after using and rinsing off the CHG Soap.  Pat yourself dry with a CLEAN TOWEL.  Wear CLEAN PAJAMAS to bed the night before surgery  Place CLEAN SHEETS on your bed the night before your surgery  DO NOT SLEEP WITH PETS.   Day of Surgery:  Take a shower with CHG soap. Wear Clean/Comfortable clothing the morning of surgery Do not apply any deodorants/lotions.   Remember to brush your teeth WITH YOUR REGULAR TOOTHPASTE.    If you received a COVID test during your pre-op visit, it is requested that you wear a mask when out in public, stay away from anyone that may not be feeling well, and notify your surgeon if you develop symptoms. If you have been in contact with anyone that has tested positive in the last 10 days, please notify your surgeon.    Please read over the following fact sheets that you were given.

## 2022-09-10 ENCOUNTER — Encounter (HOSPITAL_COMMUNITY): Payer: Self-pay

## 2022-09-10 ENCOUNTER — Encounter (HOSPITAL_COMMUNITY)
Admission: RE | Admit: 2022-09-10 | Discharge: 2022-09-10 | Disposition: A | Discharge status: Home / Self Care | Payer: PPO | Source: Ambulatory Visit | Attending: Vascular Surgery | Admitting: Vascular Surgery

## 2022-09-10 ENCOUNTER — Other Ambulatory Visit: Payer: Self-pay

## 2022-09-10 VITALS — BP 110/75 | HR 70 | Temp 97.5°F | Resp 18 | Ht 69.0 in | Wt 147.1 lb

## 2022-09-10 DIAGNOSIS — I7143 Infrarenal abdominal aortic aneurysm, without rupture: Secondary | ICD-10-CM | POA: Insufficient documentation

## 2022-09-10 DIAGNOSIS — Z01812 Encounter for preprocedural laboratory examination: Secondary | ICD-10-CM | POA: Insufficient documentation

## 2022-09-10 DIAGNOSIS — Z01818 Encounter for other preprocedural examination: Secondary | ICD-10-CM

## 2022-09-10 HISTORY — DX: Abdominal aortic aneurysm, without rupture, unspecified: I71.40

## 2022-09-10 HISTORY — DX: Gastro-esophageal reflux disease without esophagitis: K21.9

## 2022-09-10 LAB — CBC
HCT: 43 % (ref 39.0–52.0)
Hemoglobin: 14.5 g/dL (ref 13.0–17.0)
MCH: 31.7 pg (ref 26.0–34.0)
MCHC: 33.7 g/dL (ref 30.0–36.0)
MCV: 93.9 fL (ref 80.0–100.0)
Platelets: 241 10*3/uL (ref 150–400)
RBC: 4.58 MIL/uL (ref 4.22–5.81)
RDW: 12.3 % (ref 11.5–15.5)
WBC: 8.1 10*3/uL (ref 4.0–10.5)
nRBC: 0 % (ref 0.0–0.2)

## 2022-09-10 LAB — COMPREHENSIVE METABOLIC PANEL
ALT: 17 U/L (ref 0–44)
AST: 28 U/L (ref 15–41)
Albumin: 4 g/dL (ref 3.5–5.0)
Alkaline Phosphatase: 48 U/L (ref 38–126)
Anion gap: 10 (ref 5–15)
BUN: 17 mg/dL (ref 8–23)
CO2: 27 mmol/L (ref 22–32)
Calcium: 9.5 mg/dL (ref 8.9–10.3)
Chloride: 97 mmol/L — ABNORMAL LOW (ref 98–111)
Creatinine, Ser: 0.97 mg/dL (ref 0.61–1.24)
GFR, Estimated: >60 mL/min (ref >60)
Glucose, Bld: 88 mg/dL (ref 70–99)
Potassium: 4.4 mmol/L (ref 3.5–5.1)
Sodium: 134 mmol/L — ABNORMAL LOW (ref 135–145)
Total Bilirubin: 0.9 mg/dL (ref 0.3–1.2)
Total Protein: 7.2 g/dL (ref 6.5–8.1)

## 2022-09-10 LAB — URINALYSIS, ROUTINE W REFLEX MICROSCOPIC
Bilirubin Urine: NEGATIVE
Glucose, UA: NEGATIVE mg/dL
Hgb urine dipstick: NEGATIVE
Ketones, ur: NEGATIVE mg/dL
Leukocytes,Ua: NEGATIVE
Nitrite: NEGATIVE
Protein, ur: NEGATIVE mg/dL
Specific Gravity, Urine: 1.006 (ref 1.005–1.030)
pH: 7 (ref 5.0–8.0)

## 2022-09-10 LAB — SURGICAL PCR SCREEN
MRSA, PCR: NEGATIVE
Staphylococcus aureus: NEGATIVE

## 2022-09-10 LAB — APTT: aPTT: 29 seconds (ref 24–36)

## 2022-09-10 LAB — PROTIME-INR
INR: 1 (ref 0.8–1.2)
Prothrombin Time: 13.1 seconds (ref 11.4–15.2)

## 2022-09-10 NOTE — Progress Notes (Signed)
PCP - Dr. Willow Ora Cardiologist - Dr. Bryan Lemma (Pt was referred to see for cardiac clearance prior to surgery. He does not normally see cardiology)  PPM/ICD - denies   Chest x-ray - 02/20/15 EKG - 08/05/22 Stress Test - 05/31/2003 ECHO - denies Cardiac Cath - 06/01/2003  Sleep Study - denies   DM- denies  ASA/Blood Thinner Instructions: n/a   ERAS Protcol - no, NPO   COVID TEST- n/a   Anesthesia review: yes, pt saw Dr. Herbie Baltimore for pre-op cardiac clearance  Patient denies shortness of breath, fever, cough and chest pain at PAT appointment   All instructions explained to the patient, with a verbal understanding of the material. Patient agrees to go over the instructions while at home for a better understanding. The opportunity to ask questions was provided.

## 2022-09-14 DIAGNOSIS — F1120 Opioid dependence, uncomplicated: Secondary | ICD-10-CM | POA: Diagnosis not present

## 2022-09-18 ENCOUNTER — Other Ambulatory Visit (HOSPITAL_BASED_OUTPATIENT_CLINIC_OR_DEPARTMENT_OTHER): Payer: Self-pay

## 2022-09-19 ENCOUNTER — Other Ambulatory Visit: Payer: Self-pay

## 2022-09-19 ENCOUNTER — Inpatient Hospital Stay (HOSPITAL_COMMUNITY): Payer: PPO | Admitting: Anesthesiology

## 2022-09-19 ENCOUNTER — Encounter (HOSPITAL_COMMUNITY)
Admission: RE | Disposition: A | Discharge status: Home / Self Care | Payer: Self-pay | Source: Home / Self Care | Attending: Vascular Surgery

## 2022-09-19 ENCOUNTER — Inpatient Hospital Stay (HOSPITAL_COMMUNITY)
Admission: RE | Admit: 2022-09-19 | Discharge: 2022-09-25 | DRG: 269 | Disposition: A | Discharge status: Home / Self Care | Payer: PPO | Attending: Vascular Surgery | Admitting: Vascular Surgery

## 2022-09-19 ENCOUNTER — Encounter (HOSPITAL_COMMUNITY): Payer: Self-pay | Admitting: Vascular Surgery

## 2022-09-19 ENCOUNTER — Inpatient Hospital Stay (HOSPITAL_COMMUNITY): Payer: PPO

## 2022-09-19 ENCOUNTER — Inpatient Hospital Stay (HOSPITAL_COMMUNITY): Payer: PPO | Admitting: Physician Assistant

## 2022-09-19 DIAGNOSIS — F17210 Nicotine dependence, cigarettes, uncomplicated: Secondary | ICD-10-CM

## 2022-09-19 DIAGNOSIS — R269 Unspecified abnormalities of gait and mobility: Secondary | ICD-10-CM | POA: Diagnosis not present

## 2022-09-19 DIAGNOSIS — F1190 Opioid use, unspecified, uncomplicated: Secondary | ICD-10-CM | POA: Diagnosis not present

## 2022-09-19 DIAGNOSIS — I714 Abdominal aortic aneurysm, without rupture, unspecified: Secondary | ICD-10-CM | POA: Diagnosis not present

## 2022-09-19 DIAGNOSIS — M1990 Unspecified osteoarthritis, unspecified site: Secondary | ICD-10-CM | POA: Diagnosis not present

## 2022-09-19 DIAGNOSIS — K861 Other chronic pancreatitis: Secondary | ICD-10-CM | POA: Diagnosis present

## 2022-09-19 DIAGNOSIS — I10 Essential (primary) hypertension: Secondary | ICD-10-CM | POA: Diagnosis not present

## 2022-09-19 DIAGNOSIS — R509 Fever, unspecified: Secondary | ICD-10-CM | POA: Diagnosis not present

## 2022-09-19 DIAGNOSIS — J309 Allergic rhinitis, unspecified: Secondary | ICD-10-CM | POA: Diagnosis not present

## 2022-09-19 DIAGNOSIS — Z006 Encounter for examination for normal comparison and control in clinical research program: Secondary | ICD-10-CM

## 2022-09-19 DIAGNOSIS — E291 Testicular hypofunction: Secondary | ICD-10-CM | POA: Diagnosis present

## 2022-09-19 DIAGNOSIS — K219 Gastro-esophageal reflux disease without esophagitis: Secondary | ICD-10-CM | POA: Diagnosis not present

## 2022-09-19 DIAGNOSIS — Z79890 Hormone replacement therapy (postmenopausal): Secondary | ICD-10-CM

## 2022-09-19 DIAGNOSIS — E785 Hyperlipidemia, unspecified: Secondary | ICD-10-CM | POA: Diagnosis not present

## 2022-09-19 DIAGNOSIS — B182 Chronic viral hepatitis C: Secondary | ICD-10-CM | POA: Diagnosis not present

## 2022-09-19 DIAGNOSIS — Z881 Allergy status to other antibiotic agents status: Secondary | ICD-10-CM | POA: Diagnosis not present

## 2022-09-19 DIAGNOSIS — Z8249 Family history of ischemic heart disease and other diseases of the circulatory system: Secondary | ICD-10-CM | POA: Diagnosis not present

## 2022-09-19 DIAGNOSIS — Z79899 Other long term (current) drug therapy: Secondary | ICD-10-CM | POA: Diagnosis not present

## 2022-09-19 DIAGNOSIS — F419 Anxiety disorder, unspecified: Secondary | ICD-10-CM | POA: Diagnosis not present

## 2022-09-19 DIAGNOSIS — F17200 Nicotine dependence, unspecified, uncomplicated: Secondary | ICD-10-CM | POA: Diagnosis not present

## 2022-09-19 DIAGNOSIS — Z888 Allergy status to other drugs, medicaments and biological substances status: Secondary | ICD-10-CM

## 2022-09-19 HISTORY — PX: ENDARTERECTOMY FEMORAL: SHX5804

## 2022-09-19 HISTORY — PX: ULTRASOUND GUIDANCE FOR VASCULAR ACCESS: SHX6516

## 2022-09-19 HISTORY — PX: ABDOMINAL AORTIC ENDOVASCULAR STENT GRAFT: SHX5707

## 2022-09-19 HISTORY — PX: THROMBECTOMY FEMORAL ARTERY: SHX6406

## 2022-09-19 HISTORY — PX: ANGIOPLASTY: SHX39

## 2022-09-19 LAB — BASIC METABOLIC PANEL
Anion gap: 8 (ref 5–15)
BUN: 22 mg/dL (ref 8–23)
CO2: 23 mmol/L (ref 22–32)
Calcium: 7.4 mg/dL — ABNORMAL LOW (ref 8.9–10.3)
Chloride: 97 mmol/L — ABNORMAL LOW (ref 98–111)
Creatinine, Ser: 0.95 mg/dL (ref 0.61–1.24)
GFR, Estimated: >60 mL/min (ref >60)
Glucose, Bld: 175 mg/dL — ABNORMAL HIGH (ref 70–99)
Potassium: 4.4 mmol/L (ref 3.5–5.1)
Sodium: 128 mmol/L — ABNORMAL LOW (ref 135–145)

## 2022-09-19 LAB — ABO/RH: ABO/RH(D): O NEG

## 2022-09-19 LAB — POCT ACTIVATED CLOTTING TIME
Activated Clotting Time: 206 seconds
Activated Clotting Time: 223 seconds
Activated Clotting Time: 282 seconds
Activated Clotting Time: 212 seconds

## 2022-09-19 LAB — BPAM RBC
Blood Product Expiration Date: 202406082359
ISSUE DATE / TIME: 202405310916
ISSUE DATE / TIME: 202405310916
Unit Type and Rh: 9500

## 2022-09-19 LAB — POCT I-STAT 7, (LYTES, BLD GAS, ICA,H+H)
Acid-base deficit: 3 mmol/L — ABNORMAL HIGH (ref 0.0–2.0)
Acid-base deficit: 1 mmol/L (ref 0.0–2.0)
Bicarbonate: 25 mmol/L (ref 20.0–28.0)
Bicarbonate: 27.5 mmol/L (ref 20.0–28.0)
Calcium, Ion: 1.03 mmol/L — ABNORMAL LOW (ref 1.15–1.40)
Calcium, Ion: 0.97 mmol/L — ABNORMAL LOW (ref 1.15–1.40)
HCT: 25 % — ABNORMAL LOW (ref 39.0–52.0)
HCT: 32 % — ABNORMAL LOW (ref 39.0–52.0)
Hemoglobin: 8.5 g/dL — ABNORMAL LOW (ref 13.0–17.0)
Hemoglobin: 10.9 g/dL — ABNORMAL LOW (ref 13.0–17.0)
O2 Saturation: 99 %
O2 Saturation: 100 %
Patient temperature: 35.7
Potassium: 5.2 mmol/L — ABNORMAL HIGH (ref 3.5–5.1)
Potassium: 6.1 mmol/L — ABNORMAL HIGH (ref 3.5–5.1)
Sodium: 133 mmol/L — ABNORMAL LOW (ref 135–145)
Sodium: 133 mmol/L — ABNORMAL LOW (ref 135–145)
TCO2: 27 mmol/L (ref 22–32)
TCO2: 29 mmol/L (ref 22–32)
pCO2 arterial: 58.8 mmHg — ABNORMAL HIGH (ref 32–48)
pCO2 arterial: 58.4 mmHg — ABNORMAL HIGH (ref 32–48)
pH, Arterial: 7.237 — ABNORMAL LOW (ref 7.35–7.45)
pH, Arterial: 7.274 — ABNORMAL LOW (ref 7.35–7.45)
pO2, Arterial: 179 mmHg — ABNORMAL HIGH (ref 83–108)
pO2, Arterial: 322 mmHg — ABNORMAL HIGH (ref 83–108)

## 2022-09-19 LAB — TYPE AND SCREEN
ABO/RH(D): O NEG
Antibody Screen: NEGATIVE
Unit division: 0
Unit division: 0

## 2022-09-19 LAB — CBC
HCT: 33.9 % — ABNORMAL LOW (ref 39.0–52.0)
Hemoglobin: 11.7 g/dL — ABNORMAL LOW (ref 13.0–17.0)
MCH: 31.1 pg (ref 26.0–34.0)
MCHC: 34.5 g/dL (ref 30.0–36.0)
MCV: 90.2 fL (ref 80.0–100.0)
Platelets: 130 10*3/uL — ABNORMAL LOW (ref 150–400)
RBC: 3.76 MIL/uL — ABNORMAL LOW (ref 4.22–5.81)
RDW: 14.4 % (ref 11.5–15.5)
WBC: 16.6 10*3/uL — ABNORMAL HIGH (ref 4.0–10.5)
nRBC: 0 % (ref 0.0–0.2)

## 2022-09-19 LAB — PROTIME-INR
INR: 1.4 — ABNORMAL HIGH (ref 0.8–1.2)
Prothrombin Time: 17.1 seconds — ABNORMAL HIGH (ref 11.4–15.2)

## 2022-09-19 LAB — MAGNESIUM: Magnesium: 1.5 mg/dL — ABNORMAL LOW (ref 1.7–2.4)

## 2022-09-19 LAB — PREPARE RBC (CROSSMATCH)

## 2022-09-19 LAB — APTT: aPTT: 31 seconds (ref 24–36)

## 2022-09-19 LAB — POTASSIUM: Potassium: 4.4 mmol/L (ref 3.5–5.1)

## 2022-09-19 SURGERY — INSERTION, ENDOVASCULAR STENT GRAFT, AORTA, ABDOMINAL
Anesthesia: General | Site: Groin

## 2022-09-19 MED ORDER — DEXAMETHASONE SODIUM PHOSPHATE 10 MG/ML IJ SOLN
INTRAMUSCULAR | Status: DC | PRN
  Administered 2022-09-19: 10 mg via INTRAVENOUS

## 2022-09-19 MED ORDER — LIDOCAINE 2% (20 MG/ML) 5 ML SYRINGE
INTRAMUSCULAR | Status: DC | PRN
  Administered 2022-09-19: 60 mg via INTRAVENOUS

## 2022-09-19 MED ORDER — CHLORHEXIDINE GLUCONATE 0.12 % MT SOLN
OROMUCOSAL | Status: AC
  Administered 2022-09-19: 15 mL via OROMUCOSAL
  Filled 2022-09-19: qty 15

## 2022-09-19 MED ORDER — FENTANYL CITRATE (PF) 100 MCG/2ML IJ SOLN: 25.0000 ug | INTRAMUSCULAR | Status: DC | PRN

## 2022-09-19 MED ORDER — PHENYLEPHRINE 80 MCG/ML (10ML) SYRINGE FOR IV PUSH (FOR BLOOD PRESSURE SUPPORT)
PREFILLED_SYRINGE | INTRAVENOUS | Status: DC | PRN
  Administered 2022-09-19: 240 ug via INTRAVENOUS
  Administered 2022-09-19 (×5): 80 ug via INTRAVENOUS
  Administered 2022-09-19: 120 ug via INTRAVENOUS
  Administered 2022-09-19: 80 ug via INTRAVENOUS
  Administered 2022-09-19: 160 ug via INTRAVENOUS
  Administered 2022-09-19 (×3): 80 ug via INTRAVENOUS
  Administered 2022-09-19: 160 ug via INTRAVENOUS

## 2022-09-19 MED ORDER — CEFAZOLIN SODIUM-DEXTROSE 2-4 GM/100ML-% IV SOLN
INTRAVENOUS | Status: AC
  Administered 2022-09-19: 2 g via INTRAVENOUS
  Filled 2022-09-19: qty 100

## 2022-09-19 MED ORDER — ACETAMINOPHEN 325 MG RE SUPP: 325.0000 mg | RECTAL | Status: DC | PRN

## 2022-09-19 MED ORDER — PHENOL 1.4 % MT LIQD: 1.0000 | OROMUCOSAL | Status: DC | PRN

## 2022-09-19 MED ORDER — SENNOSIDES-DOCUSATE SODIUM 8.6-50 MG PO TABS
1.0000 | ORAL_TABLET | Freq: Every evening | ORAL | Status: DC | PRN
  Administered 2022-09-22 – 2022-09-24 (×2): 1 via ORAL
  Filled 2022-09-19 (×2): qty 1

## 2022-09-19 MED ORDER — HEPARIN 6000 UNIT IRRIGATION SOLUTION
Status: AC
  Filled 2022-09-19: qty 500

## 2022-09-19 MED ORDER — ONDANSETRON HCL 4 MG/2ML IJ SOLN: 4.0000 mg | Freq: Four times a day (QID) | INTRAMUSCULAR | Status: DC | PRN

## 2022-09-19 MED ORDER — FENTANYL CITRATE (PF) 250 MCG/5ML IJ SOLN
INTRAMUSCULAR | Status: AC
  Filled 2022-09-19: qty 5

## 2022-09-19 MED ORDER — ATORVASTATIN CALCIUM 10 MG PO TABS
20.0000 mg | ORAL_TABLET | Freq: Every day | ORAL | Status: DC
  Administered 2022-09-19 – 2022-09-25 (×7): 20 mg via ORAL
  Filled 2022-09-19 (×7): qty 2

## 2022-09-19 MED ORDER — SODIUM CHLORIDE 0.9 % IV SOLN: INTRAVENOUS | Status: DC

## 2022-09-19 MED ORDER — 0.9 % SODIUM CHLORIDE (POUR BTL) OPTIME
TOPICAL | Status: DC | PRN
  Administered 2022-09-19: 1000 mL

## 2022-09-19 MED ORDER — EZETIMIBE 10 MG PO TABS
5.0000 mg | ORAL_TABLET | Freq: Every day | ORAL | Status: DC
  Administered 2022-09-19 – 2022-09-25 (×7): 5 mg via ORAL
  Filled 2022-09-19 (×7): qty 1

## 2022-09-19 MED ORDER — HEPARIN SODIUM (PORCINE) 5000 UNIT/ML IJ SOLN
5000.0000 [IU] | Freq: Three times a day (TID) | INTRAMUSCULAR | Status: DC
  Administered 2022-09-20 – 2022-09-25 (×16): 5000 [IU] via SUBCUTANEOUS
  Filled 2022-09-19 (×16): qty 1

## 2022-09-19 MED ORDER — METOPROLOL TARTRATE 5 MG/5ML IV SOLN: 2.0000 mg | INTRAVENOUS | Status: DC | PRN

## 2022-09-19 MED ORDER — PANTOPRAZOLE SODIUM 40 MG PO TBEC
40.0000 mg | DELAYED_RELEASE_TABLET | Freq: Every day | ORAL | Status: DC
  Administered 2022-09-19 – 2022-09-25 (×7): 40 mg via ORAL
  Filled 2022-09-19 (×7): qty 1

## 2022-09-19 MED ORDER — OXYCODONE HCL 5 MG/5ML PO SOLN: 5.0000 mg | Freq: Once | ORAL | Status: DC | PRN

## 2022-09-19 MED ORDER — PROTAMINE SULFATE 10 MG/ML IV SOLN
INTRAVENOUS | Status: DC | PRN
  Administered 2022-09-19: 10 mg via INTRAVENOUS
  Administered 2022-09-19: 15 mg via INTRAVENOUS

## 2022-09-19 MED ORDER — METHADONE HCL 5 MG PO TABS
5.0000 mg | ORAL_TABLET | Freq: Every day | ORAL | Status: DC
  Administered 2022-09-19 – 2022-09-25 (×7): 5 mg via ORAL
  Filled 2022-09-19 (×7): qty 1

## 2022-09-19 MED ORDER — SUGAMMADEX SODIUM 200 MG/2ML IV SOLN
INTRAVENOUS | Status: DC | PRN
  Administered 2022-09-19 (×2): 100 mg via INTRAVENOUS

## 2022-09-19 MED ORDER — CEFAZOLIN SODIUM-DEXTROSE 2-4 GM/100ML-% IV SOLN
2.0000 g | INTRAVENOUS | Status: AC
  Administered 2022-09-19: 2 g via INTRAVENOUS

## 2022-09-19 MED ORDER — HEMOSTATIC AGENTS (NO CHARGE) OPTIME
TOPICAL | Status: DC | PRN
  Administered 2022-09-19: 1 via TOPICAL

## 2022-09-19 MED ORDER — CHLORHEXIDINE GLUCONATE 0.12 % MT SOLN: 15.0000 mL | Freq: Once | OROMUCOSAL | Status: AC

## 2022-09-19 MED ORDER — HEPARIN 6000 UNIT IRRIGATION SOLUTION
Status: DC | PRN
  Administered 2022-09-19 (×2): 1

## 2022-09-19 MED ORDER — LACTATED RINGERS IV SOLN: INTRAVENOUS | Status: DC

## 2022-09-19 MED ORDER — IODIXANOL 320 MG/ML IV SOLN
INTRAVENOUS | Status: DC | PRN
  Administered 2022-09-19: 107.7 mL via INTRA_ARTERIAL

## 2022-09-19 MED ORDER — LABETALOL HCL 5 MG/ML IV SOLN: 10.0000 mg | INTRAVENOUS | Status: DC | PRN

## 2022-09-19 MED ORDER — ONDANSETRON HCL 4 MG/2ML IJ SOLN
INTRAMUSCULAR | Status: AC
  Filled 2022-09-19: qty 2

## 2022-09-19 MED ORDER — HEPARIN SODIUM (PORCINE) 1000 UNIT/ML IJ SOLN
INTRAMUSCULAR | Status: DC | PRN
  Administered 2022-09-19 (×2): 3000 [IU] via INTRAVENOUS
  Administered 2022-09-19: 4000 [IU] via INTRAVENOUS
  Administered 2022-09-19: 3000 [IU] via INTRAVENOUS

## 2022-09-19 MED ORDER — ASPIRIN 81 MG PO TBEC
81.0000 mg | DELAYED_RELEASE_TABLET | Freq: Every day | ORAL | Status: DC
  Administered 2022-09-20 – 2022-09-25 (×6): 81 mg via ORAL
  Filled 2022-09-19 (×6): qty 1

## 2022-09-19 MED ORDER — EPHEDRINE SULFATE-NACL 50-0.9 MG/10ML-% IV SOSY
PREFILLED_SYRINGE | INTRAVENOUS | Status: DC | PRN
  Administered 2022-09-19 (×4): 5 mg via INTRAVENOUS

## 2022-09-19 MED ORDER — PHENYLEPHRINE HCL-NACL 20-0.9 MG/250ML-% IV SOLN
INTRAVENOUS | Status: DC | PRN
  Administered 2022-09-19: 50 ug/min via INTRAVENOUS

## 2022-09-19 MED ORDER — LIDOCAINE 2% (20 MG/ML) 5 ML SYRINGE
INTRAMUSCULAR | Status: AC
  Filled 2022-09-19: qty 5

## 2022-09-19 MED ORDER — MAGNESIUM SULFATE 2 GM/50ML IV SOLN: 2.0000 g | Freq: Every day | INTRAVENOUS | Status: DC | PRN

## 2022-09-19 MED ORDER — BISACODYL 5 MG PO TBEC
5.0000 mg | DELAYED_RELEASE_TABLET | Freq: Every day | ORAL | Status: DC | PRN
  Administered 2022-09-22 – 2022-09-24 (×3): 5 mg via ORAL
  Filled 2022-09-19 (×3): qty 1

## 2022-09-19 MED ORDER — PROPOFOL 10 MG/ML IV BOLUS
INTRAVENOUS | Status: AC
  Filled 2022-09-19: qty 20

## 2022-09-19 MED ORDER — PROPOFOL 10 MG/ML IV BOLUS
INTRAVENOUS | Status: DC | PRN
  Administered 2022-09-19: 10 mg via INTRAVENOUS
  Administered 2022-09-19: 110 mg via INTRAVENOUS

## 2022-09-19 MED ORDER — GUAIFENESIN-DM 100-10 MG/5ML PO SYRP: 15.0000 mL | ORAL_SOLUTION | ORAL | Status: DC | PRN

## 2022-09-19 MED ORDER — ACETAMINOPHEN 500 MG PO TABS: 1000.0000 mg | ORAL_TABLET | Freq: Once | ORAL | Status: DC | PRN

## 2022-09-19 MED ORDER — ORAL CARE MOUTH RINSE: 15.0000 mL | Freq: Once | OROMUCOSAL | Status: AC

## 2022-09-19 MED ORDER — VASOPRESSIN 20 UNIT/ML IV SOLN
INTRAVENOUS | Status: AC
  Filled 2022-09-19: qty 1

## 2022-09-19 MED ORDER — MIDAZOLAM HCL 2 MG/2ML IJ SOLN
INTRAMUSCULAR | Status: DC | PRN
  Administered 2022-09-19: 2 mg via INTRAVENOUS

## 2022-09-19 MED ORDER — PANTOPRAZOLE SODIUM 40 MG PO TBEC: 40.0000 mg | DELAYED_RELEASE_TABLET | Freq: Every day | ORAL | Status: DC | PRN

## 2022-09-19 MED ORDER — VASOPRESSIN 20 UNIT/ML IV SOLN
INTRAVENOUS | Status: DC | PRN
  Administered 2022-09-19: 2 [IU] via INTRAVENOUS
  Administered 2022-09-19 (×5): 1 [IU] via INTRAVENOUS

## 2022-09-19 MED ORDER — SODIUM CHLORIDE 0.9 % IV SOLN: 500.0000 mL | Freq: Once | INTRAVENOUS | Status: DC | PRN

## 2022-09-19 MED ORDER — FENTANYL CITRATE (PF) 250 MCG/5ML IJ SOLN
INTRAMUSCULAR | Status: DC | PRN
  Administered 2022-09-19 (×3): 50 ug via INTRAVENOUS
  Administered 2022-09-19: 100 ug via INTRAVENOUS

## 2022-09-19 MED ORDER — OXYCODONE HCL 5 MG PO TABS: 5.0000 mg | ORAL_TABLET | Freq: Once | ORAL | Status: DC | PRN

## 2022-09-19 MED ORDER — ACETAMINOPHEN 325 MG PO TABS
325.0000 mg | ORAL_TABLET | ORAL | Status: DC | PRN
  Administered 2022-09-23 – 2022-09-24 (×2): 650 mg via ORAL
  Filled 2022-09-19 (×2): qty 2

## 2022-09-19 MED ORDER — ALBUMIN HUMAN 5 % IV SOLN: INTRAVENOUS | Status: DC | PRN

## 2022-09-19 MED ORDER — HYDRALAZINE HCL 20 MG/ML IJ SOLN: 5.0000 mg | INTRAMUSCULAR | Status: DC | PRN

## 2022-09-19 MED ORDER — CHLORHEXIDINE GLUCONATE CLOTH 2 % EX PADS: 6.0000 | MEDICATED_PAD | Freq: Once | CUTANEOUS | Status: DC

## 2022-09-19 MED ORDER — ALUM & MAG HYDROXIDE-SIMETH 200-200-20 MG/5ML PO SUSP: 15.0000 mL | ORAL | Status: DC | PRN

## 2022-09-19 MED ORDER — ROCURONIUM BROMIDE 10 MG/ML (PF) SYRINGE
PREFILLED_SYRINGE | INTRAVENOUS | Status: AC
  Filled 2022-09-19: qty 10

## 2022-09-19 MED ORDER — ROCURONIUM BROMIDE 10 MG/ML (PF) SYRINGE
PREFILLED_SYRINGE | INTRAVENOUS | Status: DC | PRN
  Administered 2022-09-19 (×2): 10 mg via INTRAVENOUS
  Administered 2022-09-19: 30 mg via INTRAVENOUS
  Administered 2022-09-19: 70 mg via INTRAVENOUS

## 2022-09-19 MED ORDER — ACETAMINOPHEN 160 MG/5ML PO SOLN: 1000.0000 mg | Freq: Once | ORAL | Status: DC | PRN

## 2022-09-19 MED ORDER — MIDAZOLAM HCL 2 MG/2ML IJ SOLN
INTRAMUSCULAR | Status: AC
  Filled 2022-09-19: qty 2

## 2022-09-19 MED ORDER — ALPRAZOLAM 0.5 MG PO TABS
1.0000 mg | ORAL_TABLET | Freq: Four times a day (QID) | ORAL | Status: DC
  Administered 2022-09-19 – 2022-09-25 (×22): 1 mg via ORAL
  Filled 2022-09-19 (×22): qty 2

## 2022-09-19 MED ORDER — ONDANSETRON HCL 4 MG/2ML IJ SOLN
INTRAMUSCULAR | Status: DC | PRN
  Administered 2022-09-19: 4 mg via INTRAVENOUS

## 2022-09-19 MED ORDER — FENTANYL CITRATE (PF) 100 MCG/2ML IJ SOLN
INTRAMUSCULAR | Status: AC
  Filled 2022-09-19: qty 2

## 2022-09-19 MED ORDER — DOCUSATE SODIUM 100 MG PO CAPS
100.0000 mg | ORAL_CAPSULE | Freq: Every day | ORAL | Status: DC
  Administered 2022-09-20 – 2022-09-25 (×6): 100 mg via ORAL
  Filled 2022-09-19 (×6): qty 1

## 2022-09-19 MED ORDER — ALPRAZOLAM 0.5 MG PO TABS: 1.0000 mg | ORAL_TABLET | Freq: Every day | ORAL | Status: DC

## 2022-09-19 MED ORDER — ACETAMINOPHEN 10 MG/ML IV SOLN: 1000.0000 mg | Freq: Once | INTRAVENOUS | Status: DC | PRN

## 2022-09-19 MED ORDER — LACTATED RINGERS IV SOLN: INTRAVENOUS | Status: DC | PRN

## 2022-09-19 MED ORDER — DEXAMETHASONE SODIUM PHOSPHATE 10 MG/ML IJ SOLN
INTRAMUSCULAR | Status: AC
  Filled 2022-09-19: qty 1

## 2022-09-19 MED ORDER — HEPARIN SODIUM (PORCINE) 1000 UNIT/ML IJ SOLN
INTRAMUSCULAR | Status: AC
  Filled 2022-09-19: qty 10

## 2022-09-19 MED ORDER — CEFAZOLIN SODIUM-DEXTROSE 2-4 GM/100ML-% IV SOLN
2.0000 g | Freq: Three times a day (TID) | INTRAVENOUS | Status: AC
  Administered 2022-09-19: 2 g via INTRAVENOUS
  Filled 2022-09-19 (×2): qty 100

## 2022-09-19 MED ORDER — OXYCODONE HCL 5 MG PO TABS
5.0000 mg | ORAL_TABLET | ORAL | Status: DC | PRN
  Administered 2022-09-19 – 2022-09-20 (×2): 10 mg via ORAL
  Administered 2022-09-20: 5 mg via ORAL
  Administered 2022-09-21 (×2): 10 mg via ORAL
  Administered 2022-09-22 – 2022-09-23 (×3): 5 mg via ORAL
  Filled 2022-09-19: qty 1
  Filled 2022-09-19: qty 2
  Filled 2022-09-19 (×2): qty 1
  Filled 2022-09-19: qty 2
  Filled 2022-09-19: qty 1
  Filled 2022-09-19 (×2): qty 2

## 2022-09-19 MED ORDER — POTASSIUM CHLORIDE CRYS ER 20 MEQ PO TBCR: 20.0000 meq | EXTENDED_RELEASE_TABLET | Freq: Every day | ORAL | Status: DC | PRN

## 2022-09-19 SURGICAL SUPPLY — 70 items
ADH SKN CLS APL DERMABOND .7 (GAUZE/BANDAGES/DRESSINGS) ×6
BAG COUNTER SPONGE SURGICOUNT (BAG) ×3 IMPLANT
BAG DECANTER FOR FLEXI CONT (MISCELLANEOUS) IMPLANT
BAG SPNG CNTER NS LX DISP (BAG) ×3
BLADE CLIPPER SURG (BLADE) ×3 IMPLANT
CANISTER SUCT 3000ML PPV (MISCELLANEOUS) ×3 IMPLANT
CATH BEACON 5.038 65CM KMP-01 (CATHETERS) ×3 IMPLANT
CATH EMB 4FR 40 (CATHETERS) IMPLANT
CATH OMNI FLUSH .035X70CM (CATHETERS) ×3 IMPLANT
CLIP LIGATING EXTRA MED SLVR (CLIP) IMPLANT
CLIP LIGATING EXTRA SM BLUE (MISCELLANEOUS) IMPLANT
CLIP TI LARGE 6 (CLIP) IMPLANT
CLIP TI MEDIUM 24 (CLIP) IMPLANT
CLIP TI WIDE RED SMALL 24 (CLIP) IMPLANT
DERMABOND ADVANCED .7 DNX12 (GAUZE/BANDAGES/DRESSINGS) ×3 IMPLANT
DEVICE CLOSURE PERCLS PRGLD 6F (VASCULAR PRODUCTS) ×12 IMPLANT
DRSG TEGADERM 2-3/8X2-3/4 SM (GAUZE/BANDAGES/DRESSINGS) ×6 IMPLANT
ELECT CAUTERY BLADE 6.4 (BLADE) IMPLANT
ELECT REM PT RETURN 9FT ADLT (ELECTROSURGICAL) ×6
ELECTRODE REM PT RTRN 9FT ADLT (ELECTROSURGICAL) ×6 IMPLANT
EXCLDR TRNK ENDO 26X14.5X12 16 (Endovascular Graft) ×3 IMPLANT
EXCLUDER TNK END 26X14.5X12 16 (Endovascular Graft) IMPLANT
EXTENDER ENDOPROSTHESIS 12X7 (Endovascular Graft) IMPLANT
GAUZE 4X4 16PLY ~~LOC~~+RFID DBL (SPONGE) ×3 IMPLANT
GAUZE SPONGE 2X2 8PLY STRL LF (GAUZE/BANDAGES/DRESSINGS) ×6 IMPLANT
GLIDEWIRE ADV .035X180CM (WIRE) ×3 IMPLANT
GLOVE BIO SURGEON STRL SZ7.5 (GLOVE) ×3 IMPLANT
GOWN STRL REUS W/ TWL LRG LVL3 (GOWN DISPOSABLE) ×6 IMPLANT
GOWN STRL REUS W/ TWL XL LVL3 (GOWN DISPOSABLE) ×6 IMPLANT
GOWN STRL REUS W/TWL LRG LVL3 (GOWN DISPOSABLE) ×6
GOWN STRL REUS W/TWL XL LVL3 (GOWN DISPOSABLE) ×6
GRAFT BALLN CATH 65CM (BALLOONS) ×3 IMPLANT
KIT BASIN OR (CUSTOM PROCEDURE TRAY) ×3 IMPLANT
KIT DRAIN CSF ACCUDRAIN (MISCELLANEOUS) IMPLANT
KIT TURNOVER KIT B (KITS) ×3 IMPLANT
LEG CONTRALATERAL 16X14.5X10 (Vascular Products) IMPLANT
LOOP VASCULAR MINI 18 RED (MISCELLANEOUS) ×3
LOOP VESSEL MAXI BLUE 1X16 (MISCELLANEOUS) IMPLANT
NS IRRIG 1000ML POUR BTL (IV SOLUTION) ×3 IMPLANT
PACK ENDOVASCULAR (PACKS) ×3 IMPLANT
PAD ARMBOARD 7.5X6 YLW CONV (MISCELLANEOUS) ×6 IMPLANT
PENCIL BUTTON HOLSTER BLD 10FT (ELECTRODE) IMPLANT
PERCLOSE PROGLIDE 6F (VASCULAR PRODUCTS) ×24
POWDER SURGICEL 3.0 GRAM (HEMOSTASIS) IMPLANT
SET MICROPUNCTURE 5F STIFF (MISCELLANEOUS) ×3 IMPLANT
SHEATH BRITE TIP 8FR 23CM (SHEATH) ×3 IMPLANT
SHEATH DRYSEAL FLEX 12FR 33CM (SHEATH) IMPLANT
SHEATH DRYSEAL FLEX 16FR 33CM (SHEATH) IMPLANT
SHEATH PINNACLE 8F 10CM (SHEATH) ×3 IMPLANT
SPONGE T-LAP 18X18 ~~LOC~~+RFID (SPONGE) ×6 IMPLANT
STOPCOCK MORSE 400PSI 3WAY (MISCELLANEOUS) ×3 IMPLANT
SUT MNCRL AB 4-0 PS2 18 (SUTURE) ×6 IMPLANT
SUT PROLENE 5 0 C 1 24 (SUTURE) IMPLANT
SUT SILK 2 0 (SUTURE) ×3
SUT SILK 2-0 18XBRD TIE 12 (SUTURE) IMPLANT
SUT SILK 3 0 (SUTURE) ×3
SUT SILK 3-0 18XBRD TIE 12 (SUTURE) IMPLANT
SUT VIC AB 2-0 CT1 27 (SUTURE) ×6
SUT VIC AB 2-0 CT1 TAPERPNT 27 (SUTURE) IMPLANT
SUT VIC AB 3-0 SH 27 (SUTURE) ×6
SUT VIC AB 3-0 SH 27X BRD (SUTURE) IMPLANT
SYR 20ML LL LF (SYRINGE) ×3 IMPLANT
SYR 3ML LL SCALE MARK (SYRINGE) IMPLANT
SYR BULB IRRIG 60ML STRL (SYRINGE) IMPLANT
TOWEL GREEN STERILE (TOWEL DISPOSABLE) ×3 IMPLANT
TRAY FOLEY MTR SLVR 16FR STAT (SET/KITS/TRAYS/PACK) ×3 IMPLANT
TUBING INJECTOR 48 (MISCELLANEOUS) ×3 IMPLANT
VASCULAR TIE MINI RED 18IN STL (MISCELLANEOUS) IMPLANT
WIRE AMPLATZ SS-J .035X180CM (WIRE) ×3 IMPLANT
WIRE BENTSON .035X145CM (WIRE) ×6 IMPLANT

## 2022-09-19 NOTE — Op Note (Signed)
Patient name: Ricardo Fisher MRN: [id number] DOB: March 12, 1956 Sex: male  09/19/2022 Pre-operative Diagnosis: Abdominal aortic aneurysm Post-operative diagnosis:  Same Surgeon:  Apolinar Junes C. Randie Heinz, MD Assistant: Cari Caraway, MD Procedure Performed: 1.  Percutaneous access and attempted closure bilateral common femoral arteries 2.  Catheter aorta and aortogram 3.  Aortobiiliac endograft with Gore conformable main body right 26 x 14 x 12 extended on the right with 16 x 7 mm, contralateral limb left 14 x 10 cm limb 4.  Bilateral common femoral endarterectomy with vein patch angioplasty 5.  Harvest bilateral greater saphenous veins 6.  Left external and common iliac artery thrombectomy with 4 Fogarty   Indications: 67 year old male with 5.4 cm aneurysm now indicated for repair.  We have discussed his options being continued watchful waiting versus open versus endovascular repair and we have elected for endovascular repair.  We have discussed preoperatively the patient has a high risk of requiring cutdown 1 or both of his common femoral arteries given the terminal aorta with common and external iliac artery calcifications extending into both common femoral arteries.  An experienced assistant was necessary to facilitate access to the bilateral common femoral arteries with placement of sheaths and passage of wires, catheters and devices.  Assistance was also necessary to facilitate the immersion exposure of the right common femoral artery including common femoral endarterectomy and vein patch angioplasty and similarly on the left exposure of the left common femoral artery and its branches as well as common femoral endarterectomy and vein patch angioplasty.  Findings: Both common femoral arteries were heavily diseased.  Initially there was extravasation from the distal right external iliac artery we sealed this with a 16 French sheath and on the left side we initially had percutaneous access and  closure.  Endovascular aneurysm repair was performed without incidence there was good seal below the left renal artery which is the lowest and there was flow to the bilateral common iliac arteries filling both hypogastric arteries although they were both disease and the left was heavily calcified.  At completion the right side there was active extravasation we placed a small 12 French sheath and elected for cutdown.  Initially at completion we performed retrograde angiography this demonstrated patency and percutaneous closure was thought to be successful ultimately there was no flow to the left foot and cutdown was necessary there as well.  On the right side there was disease from the external iliac artery extending down to the profunda and the SFA and extensive endarterectomy was performed and the right greater saphenous vein was harvested and used as a vein patch angioplasty on the left side there was initially no inflow extensive endarterectomy was performed high into the extrailiac artery and then thrombectomy was performed which establish very strong inflow.  At completion there were palpable dorsalis pedis pulses bilaterally.   Procedure:  The patient was identified in the holding area and taken to the procedure room where is placed upon the operative table general anesthesia was induced.  He was treated prepped and draped in the abdomen bilateral groins in usual fashion, antibiotics were administered timeout was called.  We initially began with percutaneous access using ultrasound guidance of the right common femoral artery we placed a micropuncture needle for by wire and sheath and a Bentson wire under fluoroscopic guidance.  We then dilated with 8 French sheath and deployed to percutaneous closure devices.  Unfortunately on the medial side these did not deploy given heavy calcification I attempted deployed 1  more and also medial plated anteriorly.  At that time the patient dropped his pressure I placed  the 8 French sheath perform angiography and this demonstrated extravasation and we placed a Kumpe catheter into the aorta and placed an Amplatz wire followed by a 16 French sheath to seal the extravasation and the patient was given 3000 units of heparin at this time and his blood pressure did rebound.  On the left side we similarly identified the common femoral artery with ultrasound and cannulated this with a micropuncture needle followed by wire and a sheath.  Placed a Bentson wire dilated this with 8 [place] dilator deployed to Pro-glide devices again the medial 1 failed we deployed another 1 anteriorly and 1 laterally did seem to hold.  We then placed a stiff wire followed by a 12 French sheath on the left side under fluoroscopic guidance and additional 4000 units of heparin was administered and patient was heparinized throughout the case to maintain active clotting time above 225.  On the right side we then brought the main body to the level of the top of L2 and an Omni catheter was placed the level of L1.  Cranial angulation was performed and angiography was performed and demonstrated the lowest left renal artery and the main body device was deployed until the opening of the gate.  From the left side we then cannulated the gate using Kumpe catheter and Glidewire advantage and placed an Omni catheter into the graft and this was able to spin easily.  We then performed angiography which demonstrated patency of the renal arteries and the left hypogastric artery.  We placed a stiff wire and then extended on the left side with a 14 x 10 cm device.  We then fully deployed the main body device perform retrograde angiography from the right side in an LAO projection identify the left hypogastric artery and then extended with a 12 to16 tapered limb was 7 cm long.  At completion we ironed out all of this with an M OB balloon.  Completion demonstrated brisk flow into the bilateral renal arteries flow through the graft to  the bilateral hypogastric arteries and the external neck arteries were heavily diseased although there was flow through those as well.  There was possibly a late type II endoleak from 2 lumbars and an IMA.  Satisfied with this we then attempted percutaneous closure on the right side.  I deployed the Pro-glide devices and 1 of these was noted to fail.  We placed another 1 and deployed this.  I then placed micropuncture sheath over the Bentson wire and performed retrograde angiography which demonstrated active extravasation and I then placed a 12 French sheath to seal this.  On the left side we then removed our sheath and deployed the Pro-glide devices placed the micropuncture sheath which demonstrated brisk flow including down to the SFA and profunda.  Satisfied with this the wire was removed and the devices were cinched.  Attention was then turned to cutting down on the right common femoral artery.  A vertical incision was created with dissected down onto the inguinal ligament identify the external neck artery and was able to clamp this and the sheath and wire removed.  We dissected further to the SFA where this was soft and clamped this as well as the profunda.  The entire vessel was opened longitudinally extensive endarterectomy was performed we had very strong inflow with good backbleeding.  Through the same incision we harvested the greater saphenous vein  and tied it off on both ends spatulated and sewn in place as a patch angioplasty with 5-0 Prolene suture.  Prior to completion we allowed flushing all directions and flushed with heparinized saline.  Upon completion there was very strong signal in the right SFA and profunda distally and at the foot at the dorsalis pedis as well.  Unfortunately there was no signal in the left foot.  With this we then irrigated the wound on the right and packed it and turned attention to the left common femoral artery exposure.  A vertical incision again was created we dissected  down to the left common femoral artery.  It was noted that the Pro-glide devices were mostly in the soft tissue.  We dissected hide of the inguinal ligament until the external neck artery was soft and we clamped this there.  We dissected further down to the medial profunda and clamped this as well as the SFA.  The vessel was then opened vertically and extensive endarterectomy was performed unfortunately there was no strong inflow.  We performed 4 Fogarty embolectomy and establish very strong inflow and further extensive endarterectomy was performed hide under the inguinal ligament.  Through the same incision we harvested the greater saphenous vein and tied it off on both ends with 2-0 silk suture.  It was spatulated reversed and sewn in place with 5-0 Prolene suture.  Again prior to completion without flushing all directions and thoroughly irrigated with heparinized saline.  Upon completion there was very strong SFA and profunda signal in the wound and a very strong dorsalis pedis signal as well at the foot.  Satisfied with this we administered 25 mg of protamine obtain pneumostasis and both wounds and thoroughly irrigated.  The wounds were then closed in layers with Vicryl and Monocryl and Dermabond was placed at the skin level.  The patient was then awakened from anesthesia having tolerated procedure well without any complication.  All counts were correct at completion.  Contrast: 107 cc  EBL: 1 L  Transfusion 4 units packed red blood cells   Armiyah Capron C. Randie Heinz, MD [facility] Office: [phone] Pager: [phone]

## 2022-09-19 NOTE — Anesthesia Preprocedure Evaluation (Signed)
Anesthesia Evaluation  Patient identified by MRN, date of birth, ID band Patient awake    Reviewed: Allergy & Precautions, NPO status , Patient's Chart, lab work & pertinent test results  History of Anesthesia Complications Negative for: history of anesthetic complications  Airway Mallampati: I  TM Distance: >3 FB Neck ROM: Full    Dental  (+) Dental Advisory Given, Teeth Intact   Pulmonary Current Smoker and Patient abstained from smoking.   breath sounds clear to auscultation       Cardiovascular hypertension, Pt. on medications and Pt. on home beta blockers (-) angina (-) Past MI and (-) CHF  Rhythm:Regular     Neuro/Psych  PSYCHIATRIC DISORDERS Anxiety     negative neurological ROS     GI/Hepatic Neg liver ROS,GERD  Medicated and Controlled,,  Endo/Other  negative endocrine ROS    Renal/GU negative Renal ROS     Musculoskeletal  (+) Arthritis ,    Abdominal   Peds  Hematology negative hematology ROS (+) Lab Results      Component                Value               Date                      WBC                      8.1                 09/10/2022                HGB                      14.5                09/10/2022                HCT                      43.0                09/10/2022                MCV                      93.9                09/10/2022                PLT                      241                 09/10/2022              Anesthesia Other Findings   Reproductive/Obstetrics                             Anesthesia Physical Anesthesia Plan  ASA: 3  Anesthesia Plan: General   Post-op Pain Management: Ofirmev IV (intra-op)*   Induction: Intravenous  PONV Risk Score and Plan: 1 and Ondansetron and Dexamethasone  Airway Management Planned: Oral ETT  Additional Equipment: Arterial line  Intra-op Plan:   Post-operative Plan: Extubation in OR  Informed Consent: I  have reviewed the  patients History and Physical, chart, labs and discussed the procedure including the risks, benefits and alternatives for the proposed anesthesia with the patient or authorized representative who has indicated his/her understanding and acceptance.     Dental advisory given  Plan Discussed with: CRNA  Anesthesia Plan Comments:        Anesthesia Quick Evaluation

## 2022-09-19 NOTE — Anesthesia Procedure Notes (Signed)
Arterial Line Insertion Start/End5/31/2024 7:00 AM, 09/19/2022 7:10 AM  Patient location: Pre-op. Preanesthetic checklist: patient identified, IV checked, site marked, risks and benefits discussed, surgical consent, monitors and equipment checked, pre-op evaluation, timeout performed and anesthesia consent Lidocaine 1% used for infiltration Left, radial was placed Catheter size: 20 G Hand hygiene performed , maximum sterile barriers used  and Seldinger technique used Allen's test indicative of satisfactory collateral circulation Attempts: 1 Procedure performed without using ultrasound guided technique. Following insertion, dressing applied and Biopatch. Post procedure assessment: normal  Patient tolerated the procedure well with no immediate complications. Additional procedure comments: Placed by Terri Piedra.
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
## 2022-09-19 NOTE — Progress Notes (Signed)
  Day of Surgery Note    Subjective:  no complaints   Vitals:   09/19/22 1215 09/19/22 1230  BP: (!) 122/41 90/63  Pulse: 71 79  Resp: (!) 9 (!) 9  Temp:    SpO2: 99% 97%    Incisions:   bilateral groin incisions are clean and dry without hematoma Extremities:  palpable right DP pulse and brisk doppler flow left PT/DP Cardiac:  regular Lungs:  non labored Abdomen:  soft   Assessment/Plan:  This is a 67 y.o. male who is s/p    Aortobiiliac endograft and bilateral CFA endarterectomy with patch angioplasty, left external and common iliac artery thrombectomy   -pt with palpable right DP pulse and brisk doppler flow left foot -pressures are soft in recovery from 90's-100's.  May need fluid bolus if not improved.  Labs pending.  Pt has BB ordered, will discontinue this for now and re-evaluate tomorrow.  -discussed with pt that since it was a bit more extensive than expected, he may need additional nights in the hospital.  He expressed understanding.   -to 4 east later this afternoon.   Doreatha Massed, PA-C 09/19/2022 12:50 PM [phone]

## 2022-09-19 NOTE — Transfer of Care (Signed)
Immediate Anesthesia Transfer of Care Note  Patient: Ricardo Fisher  Procedure(s) Performed: ABDOMINAL AORTIC ENDOVASCULAR STENT GRAFT (Groin) ULTRASOUND GUIDANCE FOR VASCULAR ACCESS (Bilateral: Groin) BILATERAL ENDARTERECTOMY FEMORAL (Bilateral: Groin) BILATERAL VEIN ANGIOPLASTY USING BILATERAL SAPHENOUS VEIN (Bilateral: Groin) THROMBECTOMY LEFT FEMORAL ARTERY (Left: Groin)  Patient Location: PACU  Anesthesia Type:General  Level of Consciousness: awake, alert , and oriented  Airway & Oxygen Therapy: Patient Spontanous Breathing  Post-op Assessment: Report given to RN, Post -op Vital signs reviewed and stable, and Patient moving all extremities X 4  Post vital signs: Reviewed and stable  Last Vitals:  Vitals Value Taken Time  BP 131/66 09/19/22 1132  Temp    Pulse 82 09/19/22 1137  Resp 16 09/19/22 1137  SpO2 99 % 09/19/22 1137  Vitals shown include unvalidated device data.  Last Pain:  Vitals:   09/19/22 0626  TempSrc:   PainSc: 0-No pain         Complications: No notable events documented.

## 2022-09-19 NOTE — Anesthesia Procedure Notes (Signed)
Procedure Name: Intubation Date/Time: 09/19/2022 7:39 AM  Performed by: Randon Goldsmith, CRNAPre-anesthesia Checklist: Patient identified, Emergency Drugs available, Suction available and Patient being monitored Patient Re-evaluated:Patient Re-evaluated prior to induction Oxygen Delivery Method: Circle system utilized Preoxygenation: Pre-oxygenation with 100% oxygen Induction Type: IV induction Ventilation: Mask ventilation without difficulty Laryngoscope Size: Mac and 3 Grade View: Grade I Tube type: Oral Tube size: 7.5 mm Number of attempts: 1 Airway Equipment and Method: Stylet and Oral airway Placement Confirmation: ETT inserted through vocal cords under direct vision, positive ETCO2 and breath sounds checked- equal and bilateral Secured at: 22 cm Tube secured with: Tape Dental Injury: Teeth and Oropharynx as per pre-operative assessment  Comments: Placed by Terri Piedra

## 2022-09-19 NOTE — Discharge Instructions (Signed)
[facility]   Discharge Instructions  Endovascular Aortic Aneurysm Repair  Please refer to the following instructions for your post-procedure care. Your surgeon or Physician Assistant will discuss any changes with you.  Activity  You are encouraged to walk as much as you can. You can slowly return to normal activities but must avoid strenuous activity and heavy lifting until your doctor tells you it's OK. Avoid activities such as vacuuming or swinging a gold club. It is normal to feel tired for several weeks after your surgery. Do not drive until your doctor gives the OK and you are no longer taking prescription pain medications. It is also normal to have difficulty with sleep habits, eating, and bowel movements after surgery. These will go away with time.  Bathing/Showering  Shower daily after you go home.  Do not soak in a bathtub, hot tub, or swim until the incision heals completely.  If you have incisions in your groin, wash the groin wounds with soap and water daily and pat dry. (No tub bath-only shower)  Then put a dry gauze or washcloth there to keep this area dry to help prevent wound infection daily and as needed.  Do not use Vaseline or neosporin on your incisions.  Only use soap and water on your incisions and then protect and keep dry.  Incision Care  Wash the groin wound with soap and water daily and pat dry. (No tub bath-only shower)  Then put a dry gauze or washcloth there to keep this area dry daily and as needed.  Do not use Vaseline or neosporin on your incisions.  Only use soap and water on your incisions and then protect and keep dry.   Diet  Resume your normal diet. There are no special food restrictions following this procedure. A low fat/low cholesterol diet is recommended for all patients with vascular disease. In order to heal from your surgery, it is CRITICAL to get adequate nutrition. Your body requires vitamins, minerals, and  protein. Vegetables are the best source of vitamins and minerals. Vegetables also provide the perfect balance of protein. Processed food has little nutritional value, so try to avoid this.  Medications  Resume taking all of your medications unless your doctor or nurse practitioner tells you not to. If your incision is causing pain, you may take over-the-counter pain relievers such as acetaminophen (Tylenol). If you were prescribed a stronger pain medication, please be aware these medications can cause nausea and constipation. Prevent nausea by taking the medication with a snack or meal. Avoid constipation by drinking plenty of fluids and eating foods with a high amount of fiber, such as fruits, vegetables, and grains.  Do not take Tylenol if you are taking prescription pain medications.   Follow up  Our office will schedule a follow-up appointment with a CT scan 3-4 weeks after your surgery.  Please call us immediately for any of the following conditions  Severe or worsening pain in your legs or feet or in your abdomen back or chest. Increased pain, redness, drainage (pus) from your incision site. Increased abdominal pain, bloating, nausea, vomiting or persistent diarrhea. Fever of 101 degrees or higher. Swelling in your leg (s),  Reduce your risk of vascular disease  Stop smoking. If you would like help call [facility] at 1-800-QUIT-NOW [phone]) 482-5331) or [place] at [phone]. Manage your cholesterol Maintain a desired weight Control your diabetes Keep your blood pressure down  If you have questions, please call the office at  [phone].  

## 2022-09-20 LAB — TYPE AND SCREEN
Unit division: 0
Unit division: 0

## 2022-09-20 LAB — BASIC METABOLIC PANEL
Anion gap: 6 (ref 5–15)
BUN: 17 mg/dL (ref 8–23)
CO2: 26 mmol/L (ref 22–32)
Calcium: 8 mg/dL — ABNORMAL LOW (ref 8.9–10.3)
Chloride: 102 mmol/L (ref 98–111)
Creatinine, Ser: 0.98 mg/dL (ref 0.61–1.24)
GFR, Estimated: >60 mL/min (ref >60)
Glucose, Bld: 96 mg/dL (ref 70–99)
Potassium: 4 mmol/L (ref 3.5–5.1)
Sodium: 134 mmol/L — ABNORMAL LOW (ref 135–145)

## 2022-09-20 LAB — BPAM RBC
Blood Product Expiration Date: 202406082359
Blood Product Expiration Date: 202406102359
Blood Product Expiration Date: 202406102359
ISSUE DATE / TIME: 202405310857
ISSUE DATE / TIME: 202405310857
Unit Type and Rh: 9500
Unit Type and Rh: 9500
Unit Type and Rh: 9500

## 2022-09-20 LAB — CBC
HCT: 26.9 % — ABNORMAL LOW (ref 39.0–52.0)
Hemoglobin: 9.4 g/dL — ABNORMAL LOW (ref 13.0–17.0)
MCH: 30.8 pg (ref 26.0–34.0)
MCHC: 34.9 g/dL (ref 30.0–36.0)
MCV: 88.2 fL (ref 80.0–100.0)
Platelets: 118 10*3/uL — ABNORMAL LOW (ref 150–400)
RBC: 3.05 MIL/uL — ABNORMAL LOW (ref 4.22–5.81)
RDW: 14.7 % (ref 11.5–15.5)
WBC: 9.8 10*3/uL (ref 4.0–10.5)
nRBC: 0 % (ref 0.0–0.2)

## 2022-09-20 NOTE — Progress Notes (Addendum)
  Progress Note    09/20/2022 8:45 AM 1 Day Post-Op  Subjective:  soreness in bilateral groins. Scrotal bruising present. No other major complaints   Vitals:   09/20/22 0400 09/20/22 0800  BP: (!) 95/58 (!) 100/49  Pulse: 71 87  Resp: 11 20  Temp: 98 F (36.7 C) 97.9 F (36.6 C)  SpO2: 97% 100%   Physical Exam: Cardiac:  regular Lungs:  non labored Incisions:  B groin incisions are intact and well appearing without swelling or hematoma. Ecchymosis is present and extends into scrotum Extremities:  well perfused and warm with palpable DP pulses bilaterally Abdomen:  soft, non distended, mildly tender in RLQ Neurologic: alert and oriented  CBC    Component Value Date/Time   WBC 9.8 09/20/2022 0615   RBC 3.05 (L) 09/20/2022 0615   HGB 9.4 (L) 09/20/2022 0615   HCT 26.9 (L) 09/20/2022 0615   PLT 118 (L) 09/20/2022 0615   MCV 88.2 09/20/2022 0615   MCH 30.8 09/20/2022 0615   MCHC 34.9 09/20/2022 0615   RDW 14.7 09/20/2022 0615   LYMPHSABS 1,264 03/18/2022 1044   MONOABS 0.9 02/11/2022 1027   EOSABS 112 03/18/2022 1044   BASOSABS 40 03/18/2022 1044    BMET    Component Value Date/Time   NA 134 (L) 09/20/2022 0615   K 4.0 09/20/2022 0615   CL 102 09/20/2022 0615   CO2 26 09/20/2022 0615   GLUCOSE 96 09/20/2022 0615   BUN 17 09/20/2022 0615   CREATININE 0.98 09/20/2022 0615   CREATININE 0.91 05/31/2015 0843   CALCIUM 8.0 (L) 09/20/2022 0615   GFRNONAA >60 09/20/2022 0615   GFRNONAA >89 05/31/2015 0843   GFRAA >89 05/31/2015 0843    INR    Component Value Date/Time   INR 1.4 (H) 09/19/2022 1228     Intake/Output Summary (Last 24 hours) at 09/20/2022 0845 Last data filed at 09/20/2022 0800 Gross per 24 hour  Intake 6138 ml  Output 3597 ml  Net 2541 ml     Assessment/Plan:  67 y.o. male is s/p Aortobiiliac endograft and bilateral CFA endarterectomy with patch angioplasty, left external and common iliac artery thrombectomy   1 Day Post-Op   Bilateral  femoral access sites are intact and well appearing, some ecchymosis present and he also has scrotum ecchymosis. Voiding without issues BLE well perfused with palpable pulses Pain control PRN BP remains soft. Hold home BP meds Continue IV fluids H&H stable Okay to mobilize today   Graceann Congress, PA-C [facility] [phone] 09/20/2022 8:45 AM  I have seen and evaluated the patient. I agree with the PA note as documented above.  Postop day 1 status post EVAR for abdominal aortic aneurysm requiring bilateral common femoral artery endarterectomy with patch angioplasty.  Both groins look great.  He has palpable DP pulses.  Scrotum is sore with some ecchymosis.  I discussed elevation and ice packs if needed.  Will give him more time to mobilize but overall looks good.  Hemoglobin today 9.4.  Creatinine 0.98.  Cephus Shelling, MD [facility] [place] Office: [phone]

## 2022-09-20 NOTE — Anesthesia Postprocedure Evaluation (Signed)
Anesthesia Post Note  Patient: Ricardo Fisher  Procedure(s) Performed: ABDOMINAL AORTIC ENDOVASCULAR STENT GRAFT (Groin) ULTRASOUND GUIDANCE FOR VASCULAR ACCESS (Bilateral: Groin) BILATERAL ENDARTERECTOMY FEMORAL (Bilateral: Groin) BILATERAL VEIN ANGIOPLASTY USING BILATERAL SAPHENOUS VEIN (Bilateral: Groin) THROMBECTOMY LEFT FEMORAL ARTERY (Left: Groin)     Patient location during evaluation: PACU Anesthesia Type: General Level of consciousness: awake and alert Pain management: pain level controlled Vital Signs Assessment: post-procedure vital signs reviewed and stable Respiratory status: spontaneous breathing, nonlabored ventilation, respiratory function stable and patient connected to nasal cannula oxygen Cardiovascular status: blood pressure returned to baseline and stable Postop Assessment: no apparent nausea or vomiting Anesthetic complications: no   No notable events documented.  Last Vitals:  Vitals:   09/20/22 0800 09/20/22 1126  BP: (!) 100/49 103/60  Pulse: 87 75  Resp: 20 14  Temp: 36.6 C 36.6 C  SpO2: 100% 100%    Last Pain:  Vitals:   09/20/22 1126  TempSrc: Oral  PainSc:                  [name]

## 2022-09-21 LAB — BASIC METABOLIC PANEL
Anion gap: 6 (ref 5–15)
BUN: 13 mg/dL (ref 8–23)
CO2: 28 mmol/L (ref 22–32)
Calcium: 8 mg/dL — ABNORMAL LOW (ref 8.9–10.3)
Chloride: 102 mmol/L (ref 98–111)
Creatinine, Ser: 0.98 mg/dL (ref 0.61–1.24)
GFR, Estimated: >60 mL/min (ref >60)
Glucose, Bld: 109 mg/dL — ABNORMAL HIGH (ref 70–99)
Potassium: 4.1 mmol/L (ref 3.5–5.1)
Sodium: 136 mmol/L (ref 135–145)

## 2022-09-21 LAB — CBC
HCT: 25.9 % — ABNORMAL LOW (ref 39.0–52.0)
Hemoglobin: 9 g/dL — ABNORMAL LOW (ref 13.0–17.0)
MCH: 31.3 pg (ref 26.0–34.0)
MCHC: 34.7 g/dL (ref 30.0–36.0)
MCV: 89.9 fL (ref 80.0–100.0)
Platelets: 122 10*3/uL — ABNORMAL LOW (ref 150–400)
RBC: 2.88 MIL/uL — ABNORMAL LOW (ref 4.22–5.81)
RDW: 14.2 % (ref 11.5–15.5)
WBC: 8.7 10*3/uL (ref 4.0–10.5)
nRBC: 0 % (ref 0.0–0.2)

## 2022-09-21 NOTE — Evaluation (Signed)
Physical Therapy Evaluation Patient Details Name: Ricardo Fisher MRN: [id number] DOB: 18-Apr-1956 Today's Date: 09/21/2022  History of Present Illness  Patient is 67 y.o. male s/p Aortobiiliac endograft and bilateral CFA endarterectomy with patch angioplasty, left external and common iliac artery thrombectomy on 09/19/22. PMH significant for AAA, arthritis, anxiety, pancreatitis, GERD, HLD, HTN, Lt heart caht and coronary angiography in 2005.   Clinical Impression  Ricardo Fisher is 67 y.o. male admitted with above HPI and diagnosis. Patient is currently limited by functional impairments below (see PT problem list). Patient lives alone and is independent at baseline. Patient is most limited by abdominal discomfort with transitional movements. Education provided to exhale with efforts and transitions such as bed mobility and sit<>stands to reduce intra-abdominal pressure and lessen discomfort. This session pt required min assist for bed mob, transfers, and min guard for gait with RW and IV pole. Anticipate good progress with mobility and pt reports family, friends, and neighbors will be available intermittently to assist. Patient will benefit from continued skilled PT interventions to address impairments and progress independence with mobility. Acute PT will follow and progress as able.        Recommendations for follow up therapy are one component of a multi-disciplinary discharge planning process, led by the attending physician.  Recommendations may be updated based on patient status, additional functional criteria and insurance authorization.  Follow Up Recommendations       Assistance Recommended at Discharge Intermittent Supervision/Assistance  Patient can return home with the following  A little help with walking and/or transfers;A little help with bathing/dressing/bathroom;Assistance with cooking/housework;Assist for transportation;Help with stairs or ramp for entrance    Equipment  Recommendations Rolling walker (2 wheels);BSC/3in1  Recommendations for Other Services       Functional Status Assessment Patient has had a recent decline in their functional status and demonstrates the ability to make significant improvements in function in a reasonable and predictable amount of time.     Precautions / Restrictions Precautions Precautions: Fall Restrictions Weight Bearing Restrictions: No      Mobility  Bed Mobility Overal bed mobility: Needs Assistance Bed Mobility: Rolling, Sidelying to Sit Rolling: Min assist Sidelying to sit: Min assist       General bed mobility comments: cues provided for log roll technique to reduce abdominal strain/pressure with supine>sit. Min assist required to fully raise trunk. cues to exhale when making efforful movement.    Transfers Overall transfer level: Needs assistance Equipment used: Rolling walker (2 wheels) Transfers: Sit to/from Stand Sit to Stand: Min assist           General transfer comment: Cues for hand placement, bil UE use to power up from EOB. Min assist to rise to walker and cue to exhale with rise and lower to recliner with bil UE reach back.    Ambulation/Gait Ambulation/Gait assistance: Min assist, Min guard Gait Distance (Feet): 300 Feet Assistive device: Rolling walker (2 wheels), IV Pole Gait Pattern/deviations: Step-through pattern, Decreased step length - right, Decreased step length - left, Decreased stride length, Shuffle, Trunk flexed Gait velocity: decr     General Gait Details: min assist at start for safe management of RW, tending to drift to Lt (suspect due to walker alignment). progressed to amb with IV pole and Rt UE support, pt's steps shortened and shuffled with low hip flexion and foot clearance due to pain. no overt LOb noted throughout.  Stairs            Psychologist, prison and probation services  Modified Rankin (Stroke Patients Only)       Balance Overall balance assessment: Needs  assistance Sitting-balance support: Feet supported Sitting balance-Leahy Scale: Good     Standing balance support: Reliant on assistive device for balance, During functional activity, Bilateral upper extremity supported, Single extremity supported Standing balance-Leahy Scale: Fair                               Pertinent Vitals/Pain Pain Assessment Pain Assessment: Faces Faces Pain Scale: Hurts little more Pain Location: abdomen Pain Descriptors / Indicators: Pressure, Discomfort Pain Intervention(s): Limited activity within patient's tolerance, Monitored during session, Repositioned    Home Living Family/patient expects to be discharged to:: Private residence Living Arrangements: Alone Available Help at Discharge: Family;Friend(s);Available PRN/intermittently Type of Home: House Home Access: Stairs to enter Entrance Stairs-Rails: Right Entrance Stairs-Number of Steps: 6   Home Layout: One level Home Equipment: None      Prior Function Prior Level of Function : Independent/Modified Independent;Working/employed;Driving                     Hand Dominance        Extremity/Trunk Assessment   Upper Extremity Assessment Upper Extremity Assessment: Overall WFL for tasks assessed    Lower Extremity Assessment Lower Extremity Assessment: Generalized weakness (limited by discomfort at hips/groin)    Cervical / Trunk Assessment Cervical / Trunk Assessment: Normal  Communication   Communication: No difficulties  Cognition Arousal/Alertness: Awake/alert Behavior During Therapy: WFL for tasks assessed/performed Overall Cognitive Status: Within Functional Limits for tasks assessed                                          General Comments      Exercises     Assessment/Plan    PT Assessment Patient needs continued PT services  PT Problem List Decreased activity tolerance;Decreased balance;Decreased mobility;Decreased knowledge of  use of DME;Decreased safety awareness;Decreased knowledge of precautions;Cardiopulmonary status limiting activity;Pain       PT Treatment Interventions DME instruction;Gait training;Stair training;Functional mobility training;Therapeutic activities;Therapeutic exercise;Balance training;Neuromuscular re-education;Patient/family education    PT Goals (Current goals can be found in the Care Plan section)  Acute Rehab PT Goals Patient Stated Goal: recover strength and decrease abdominal pain PT Goal Formulation: With patient Time For Goal Achievement: 10/05/22 Potential to Achieve Goals: Good    Frequency Min 3X/week     Co-evaluation               AM-PAC PT "6 Clicks" Mobility  Outcome Measure Help needed turning from your back to your side while in a flat bed without using bedrails?: A Little Help needed moving from lying on your back to sitting on the side of a flat bed without using bedrails?: A Little Help needed moving to and from a bed to a chair (including a wheelchair)?: A Little Help needed standing up from a chair using your arms (e.g., wheelchair or bedside chair)?: A Little Help needed to walk in hospital room?: A Little Help needed climbing 3-5 steps with a railing? : A Lot 6 Click Score: 17    End of Session Equipment Utilized During Treatment: Gait belt Activity Tolerance: Patient tolerated treatment well Patient left: in chair;with call bell/phone within reach;with chair alarm set Nurse Communication: Mobility status PT Visit Diagnosis: Unsteadiness on feet (R26.81);Muscle weakness (  generalized) (M62.81);Difficulty in walking, not elsewhere classified (R26.2);Pain Pain - part of body:  (abdomen)    Time: 1610-9604 PT Time Calculation (min) (ACUTE ONLY): 42 min   Charges:   PT Evaluation $PT Eval Moderate Complexity: 1 Mod PT Treatments $Gait Training: 8-22 mins $Therapeutic Activity: 8-22 mins        Wynn Maudlin, DPT Acute Rehabilitation  Services Office [phone]  09/21/22 4:38 PM

## 2022-09-21 NOTE — Progress Notes (Addendum)
  Progress Note    09/21/2022 9:55 AM 2 Days Post-Op  Subjective:  some lower abdominal/ inguinal discomfort. Ambulated yesterday and felt pretty good. Just concerned regarding going from sitting to standing, getting out of chair/ off commode. Pain overall well controlled   Vitals:   09/21/22 0413 09/21/22 0829  BP: 122/74 109/67  Pulse: 97 100  Resp: 17 13  Temp: 98.8 F (37.1 C) (!) 97.5 F (36.4 C)  SpO2: 97% 100%   Physical Exam: Cardiac:  regular Lungs:  non labored Incisions:  B groin incisions are intact and well appearing, no swelling or hematoma. Ecchymosis present in scrotum Extremities:  well perfused and warm with palpable Dp pulses bilaterally Abdomen:  soft, mildly tender across lower abdomen Neurologic: alert and oriented  CBC    Component Value Date/Time   WBC 8.7 09/21/2022 0135   RBC 2.88 (L) 09/21/2022 0135   HGB 9.0 (L) 09/21/2022 0135   HCT 25.9 (L) 09/21/2022 0135   PLT 122 (L) 09/21/2022 0135   MCV 89.9 09/21/2022 0135   MCH 31.3 09/21/2022 0135   MCHC 34.7 09/21/2022 0135   RDW 14.2 09/21/2022 0135   LYMPHSABS 1,264 03/18/2022 1044   MONOABS 0.9 02/11/2022 1027   EOSABS 112 03/18/2022 1044   BASOSABS 40 03/18/2022 1044    BMET    Component Value Date/Time   NA 136 09/21/2022 0135   K 4.1 09/21/2022 0135   CL 102 09/21/2022 0135   CO2 28 09/21/2022 0135   GLUCOSE 109 (H) 09/21/2022 0135   BUN 13 09/21/2022 0135   CREATININE 0.98 09/21/2022 0135   CREATININE 0.91 05/31/2015 0843   CALCIUM 8.0 (L) 09/21/2022 0135   GFRNONAA >60 09/21/2022 0135   GFRNONAA >89 05/31/2015 0843   GFRAA >89 05/31/2015 0843    INR    Component Value Date/Time   INR 1.4 (H) 09/19/2022 1228     Intake/Output Summary (Last 24 hours) at 09/21/2022 0955 Last data filed at 09/21/2022 0839 Gross per 24 hour  Intake 1175 ml  Output 2200 ml  Net -1025 ml     Assessment/Plan:  67 y.o. male is s/p Aortobiiliac endograft and bilateral CFA endarterectomy  with patch angioplasty, left external and common iliac artery thrombectomy  2 Days Post-Op   Bilateral groin incisions are clean, dry and intact without swelling or hematoma Bilateral lower extremities are well perfused and warm with palpable Dp pulses Scrotal ecchymosis unchanged Pain well controlled Hgb stable at 9 today. Asymptomatic Will ask PT to see today to eval Lives independently so some concern with mobility Anticipate D/c tomorrow once more comfortable ambulating   Graceann Congress, [place] [facility] [phone] 09/21/2022 9:55 AM  I have seen and evaluated the patient. I agree with the PA note as documented above.  Overall doing well after abdominal aortic stent graft on Friday with bilateral common femoral endarterectomies.  Both groins look good.  He has palpable dorsalis pedis pulses.  Hemoglobin is stable.  Just remains sore.  Will work on mobility today and will ask PT to see him.  Continue elevating the scrotum.  Needs more time prior to discharge.  Cephus Shelling, MD [facility] [place] Office: [phone]

## 2022-09-22 ENCOUNTER — Encounter: Payer: PPO | Admitting: Internal Medicine

## 2022-09-22 ENCOUNTER — Encounter (HOSPITAL_COMMUNITY): Payer: Self-pay | Admitting: Vascular Surgery

## 2022-09-22 LAB — CBC
HCT: 23.5 % — ABNORMAL LOW (ref 39.0–52.0)
Hemoglobin: 8 g/dL — ABNORMAL LOW (ref 13.0–17.0)
MCH: 32 pg (ref 26.0–34.0)
MCHC: 34 g/dL (ref 30.0–36.0)
MCV: 94 fL (ref 80.0–100.0)
Platelets: 131 10*3/uL — ABNORMAL LOW (ref 150–400)
RBC: 2.5 MIL/uL — ABNORMAL LOW (ref 4.22–5.81)
RDW: 13.3 % (ref 11.5–15.5)
WBC: 9.7 10*3/uL (ref 4.0–10.5)
nRBC: 0 % (ref 0.0–0.2)

## 2022-09-22 NOTE — Progress Notes (Addendum)
  Progress Note    09/22/2022 7:50 AM 3 Days Post-Op  Subjective:  groin and abdominal discomfort is feeling better. Still no BM since surgery. Not ready to go home yet    Vitals:   09/22/22 0040 09/22/22 0427  BP: 129/62 106/65  Pulse: (!) 101 90  Resp: 19 20  Temp: 98.8 F (37.1 C) 98.5 F (36.9 C)  SpO2: 93% 95%    Physical Exam: General:  no acute distress Cardiac:  regular Lungs:  nonlabored Incisions:  bilateral groin incisions soft without hematoma. Scrotal ecchymosis Extremities:  BLE warm and well perfused with palpable DP pulses  CBC    Component Value Date/Time   WBC 8.7 09/21/2022 0135   RBC 2.88 (L) 09/21/2022 0135   HGB 9.0 (L) 09/21/2022 0135   HCT 25.9 (L) 09/21/2022 0135   PLT 122 (L) 09/21/2022 0135   MCV 89.9 09/21/2022 0135   MCH 31.3 09/21/2022 0135   MCHC 34.7 09/21/2022 0135   RDW 14.2 09/21/2022 0135   LYMPHSABS 1,264 03/18/2022 1044   MONOABS 0.9 02/11/2022 1027   EOSABS 112 03/18/2022 1044   BASOSABS 40 03/18/2022 1044    BMET    Component Value Date/Time   NA 136 09/21/2022 0135   K 4.1 09/21/2022 0135   CL 102 09/21/2022 0135   CO2 28 09/21/2022 0135   GLUCOSE 109 (H) 09/21/2022 0135   BUN 13 09/21/2022 0135   CREATININE 0.98 09/21/2022 0135   CREATININE 0.91 05/31/2015 0843   CALCIUM 8.0 (L) 09/21/2022 0135   GFRNONAA >60 09/21/2022 0135   GFRNONAA >89 05/31/2015 0843   GFRAA >89 05/31/2015 0843    INR    Component Value Date/Time   INR 1.4 (H) 09/19/2022 1228     Intake/Output Summary (Last 24 hours) at 09/22/2022 0750 Last data filed at 09/21/2022 1856 Gross per 24 hour  Intake 1545 ml  Output 800 ml  Net 745 ml      Assessment/Plan:  67 y.o. male is 3 days post op, s/p: Aortobiiliac endograft and bilateral CFA endarterectomy with patch angioplasty, left external and common iliac artery thrombectomy    -Bilateral lower extremities well perfused with palpable DP pulses -Bilateral groin incisions intact and  soft without hematoma -Unchanged scrotal ecchymosis -PT has evaluated patient and recommends HHPT with BSC and RW. Will place orders -Needs more time for mobility. Potentially home tomorrow. Also needs to have a BM   Loel Dubonnet, PA-C [facility] [phone] 09/22/2022 7:50 AM   I have independently interviewed and examined patient and agree with PA assessment and plan above. Recovering from bilateral groin incisions not yet safe for home. Work with PT today, bowel stimulation and pain control.  Vernis Cabacungan C. Randie Heinz, MD [facility] [place] Office: [phone] Pager: [phone]

## 2022-09-22 NOTE — TOC Initial Note (Signed)
Transition of Care (TOC) - Initial/Assessment Note  Donn Pierini RN, BSN Transitions of Care Unit 4E- RN Case Manager See Treatment Team for direct phone #   Patient Details  Name: Ricardo Fisher MRN: [id number] Date of Birth: Sep 08, 1955  Transition of Care [facility]) CM/SW Contact:    Darrold Span, RN Phone Number: 09/22/2022, 4:21 PM  Clinical Narrative:                 Pt s/p AAA repair, CM in to speak with pt regarding transition of care needs.  Per conversation pt reports that he will have assistance at discharge, daughter will transport home.   Discussed VSS office referral w/ [facility] for [facility] needs- pt voiced understanding and is agreeable to [facility] servicing him for [facility] needs- HHPT. CM will follow up with liaison for start of care on discharge.   CM also discussed DME needs- RW/BSC- pt voiced he does not have a preference for provider- [facility] will deliver DME to bedside prior to discharge. Liaison contacted.   Address, phone # and PCP all confirmed in epic.  CM answered questions and provided info regarding how billing works and how pt can call after he gets his bill to speak with someone regarding setting up a payment plan if needed.   TOC will continue to follow   Expected Discharge Plan: Home w Home Health Services Barriers to Discharge: Continued Medical Work up   Patient Goals and CMS Choice Patient states their goals for this hospitalization and ongoing recovery are:: return home CMS Medicare.gov Compare Post Acute Care list provided to:: Patient Choice offered to / list presented to : Patient      Expected Discharge Plan and Services   Discharge Planning Services: CM Consult Post Acute Care Choice: Home Health, Durable Medical Equipment Living arrangements for the past 2 months: Single Family Home                 DME Arranged: Bedside commode, Walker rolling DME Agency: [facility] Date DME Agency Contacted: 09/22/22 Time DME Agency Contacted:  1230 Representative spoke with at DME Agency: Zollie Beckers HH Arranged: PT HH Agency: [facility] Date [facility] Agency Contacted: 09/22/22   Representative spoke with at [facility] Agency: Amy  Prior Living Arrangements/Services Living arrangements for the past 2 months: Single Family Home Lives with:: Self Patient language and need for interpreter reviewed:: Yes Do you feel safe going back to the place where you live?: Yes      Need for Family Participation in Patient Care: Yes (Comment) Care giver support system in place?: Yes (comment)   Criminal Activity/Legal Involvement Pertinent to Current Situation/Hospitalization: No - Comment as needed  Activities of Daily Living Home Assistive Devices/Equipment: Blood pressure cuff ADL Screening (condition at time of admission) Patient's cognitive ability adequate to safely complete daily activities?: Yes Is the patient deaf or have difficulty hearing?: No Does the patient have difficulty seeing, even when wearing glasses/contacts?: No Does the patient have difficulty concentrating, remembering, or making decisions?: No Patient able to express need for assistance with ADLs?: Yes Does the patient have difficulty dressing or bathing?: No Independently performs ADLs?: Yes (appropriate for developmental age) Does the patient have difficulty walking or climbing stairs?: No Weakness of Legs: None Weakness of Arms/Hands: None  Permission Sought/Granted Permission sought to share information with : Facility Industrial/product designer granted to share information with : Yes, Verbal Permission Granted     Permission granted to share info w AGENCY: HH/DME  Emotional Assessment Appearance:: Appears stated age Attitude/Demeanor/Rapport: Engaged Affect (typically observed): Accepting, Appropriate, Pleasant Orientation: : Oriented to Self, Oriented to Place, Oriented to  Time, Oriented to Situation Alcohol / Substance Use: Not  Applicable Psych Involvement: No (comment)  Admission diagnosis:  Abdominal aortic aneurysm (AAA) greater than 5.5 cm in diameter in male [facility]) [I71.40] Patient Active Problem List   Diagnosis Date Noted   Abdominal aortic aneurysm (AAA) greater than 5.5 cm in diameter in male (HCC) 09/19/2022   Preoperative cardiovascular examination 08/10/2022   Infrarenal abdominal aortic aneurysm (AAA) without rupture (HCC) 04/01/2022   Dilated bile ducts - chronic 04/01/2022   Dilated pancreatic duct with EUS criteria for chronic pancreatitis met 04/01/2022   Fat pad syndrome 09/16/2016   Repetitive strain injury of foot, initial encounter 09/16/2016   Bilateral foot pain 09/16/2016   Cigarette smoker 01/30/2015   PCP NOTES >>> 01/22/2015   Annual physical exam 01/14/2013   Allergic rhinitis 01/14/2013   URINARY TRACT INFECTION SITE NOT SPECIFIED 04/25/2010   Hypogonadism in male 12/18/2009   ACNE ROSACEA 03/27/2009   Hyperlipidemia 09/04/2006   Essential hypertension 09/04/2006   Anxiety 08/13/2006   CARRIER, VIRAL HEPATITIS C 08/13/2006   PCP:  Wanda Plump, MD Pharmacy:   [facility] [place] [facility] [address] [place] [address] Phone: [phone] Fax: [phone]     Social Determinants of Health (SDOH) Social History: SDOH Screenings   Food Insecurity: No Food Insecurity (09/20/2022)  Housing: Low Risk  (09/20/2022)  Transportation Needs: No Transportation Needs (09/20/2022)  Utilities: Not At Risk (09/20/2022)  Alcohol Screen: Low Risk  (03/04/2018)  Depression (PHQ2-9): Low Risk  (05/19/2022)  Tobacco Use: High Risk (09/22/2022)   SDOH Interventions:     Readmission Risk Interventions     No data to display

## 2022-09-22 NOTE — Progress Notes (Signed)
Physical Therapy Treatment Patient Details Name: Ricardo Fisher MRN: [id number] DOB: 05-08-1955 Today's Date: 09/22/2022   History of Present Illness Patient is 67 y.o. male s/p Aortobiiliac endograft and bilateral CFA endarterectomy with patch angioplasty, left external and common iliac artery thrombectomy on 09/19/22. PMH significant for AAA, arthritis, anxiety, pancreatitis, GERD, HLD, HTN, Lt heart caht and coronary angiography in 2005.    PT Comments    The pt was agreeable to session with focus on progression of ambulation endurance and stair training. The pt continues to present with soft BP which further decreased after completing a flight of stairs, but pt reports asymptomatic and is motivated to continue mobility. The pt was able to progress walking distance, continues to benefit from cues for posture and positioning in RW as well as step-through gait pattern. Recommendations remain appropriate, pt eager to continue progressing and return to independence.    Recommendations for follow up therapy are one component of a multi-disciplinary discharge planning process, led by the attending physician.  Recommendations may be updated based on patient status, additional functional criteria and insurance authorization.  Follow Up Recommendations       Assistance Recommended at Discharge Intermittent Supervision/Assistance  Patient can return home with the following A little help with walking and/or transfers;A little help with bathing/dressing/bathroom;Assistance with cooking/housework;Assist for transportation;Help with stairs or ramp for entrance   Equipment Recommendations  Rolling walker (2 wheels);BSC/3in1    Recommendations for Other Services       Precautions / Restrictions Precautions Precautions: Fall Precaution Comments: pt reports eager to mobilize Restrictions Weight Bearing Restrictions: No     Mobility  Bed Mobility Overal bed mobility: Needs Assistance Bed  Mobility: Rolling, Sidelying to Sit Rolling: Min guard Sidelying to sit: Min guard, HOB elevated       General bed mobility comments: cues for log roll technique and breathing with mobility. pt using bed rail and HOB elevated    Transfers Overall transfer level: Needs assistance Equipment used: Rolling walker (2 wheels) Transfers: Sit to/from Stand Sit to Stand: Min guard           General transfer comment: Cues for hand placement, bil UE use to power up from EOB, cues for hand placement    Ambulation/Gait Ambulation/Gait assistance: Min guard Gait Distance (Feet): 300 Feet Assistive device: Rolling walker (2 wheels) Gait Pattern/deviations: Step-through pattern, Decreased step length - right, Decreased step length - left, Decreased stride length, Shuffle, Trunk flexed Gait velocity: decr Gait velocity interpretation: <1.31 ft/sec, indicative of household ambulator   General Gait Details: repeated cues to improve posture and proximity to RW. BP soft but pt reports asymptomatic. able to progress to step-through with effort   Stairs Stairs: Yes Stairs assistance: Min assist Stair Management: One rail Left, Step to pattern, Forwards Number of Stairs: 12 General stair comments: BP to 94/47 (61) but pt determined to complete flight and asymptomatic. minA to steady with use of single rail    Balance Overall balance assessment: Needs assistance Sitting-balance support: Feet supported Sitting balance-Leahy Scale: Good     Standing balance support: Reliant on assistive device for balance, During functional activity, Bilateral upper extremity supported, Single extremity supported Standing balance-Leahy Scale: Fair                              Cognition Arousal/Alertness: Awake/alert Behavior During Therapy: WFL for tasks assessed/performed Overall Cognitive Status: Within Functional Limits for tasks assessed  General Comments General comments (skin integrity, edema, etc.): BP soft and decreased after flight of stairs, pt reports asymptomatic. improved by return to room      Pertinent Vitals/Pain Pain Assessment Pain Assessment: Faces Pain Score: 4  Faces Pain Scale: Hurts little more Pain Location: abdomen Pain Descriptors / Indicators: Pressure, Discomfort Pain Intervention(s): Monitored during session, Limited activity within patient's tolerance    Home Living Family/patient expects to be discharged to:: Private residence Living Arrangements: Alone Available Help at Discharge: Family;Friend(s);Available PRN/intermittently Type of Home: House Home Access: Stairs to enter Entrance Stairs-Rails: Right Entrance Stairs-Number of Steps: 6   Home Layout: One level Home Equipment: None      Prior Function            PT Goals (current goals can now be found in the care plan section) Acute Rehab PT Goals Patient Stated Goal: recover strength and decrease abdominal pain PT Goal Formulation: With patient Time For Goal Achievement: 10/05/22 Potential to Achieve Goals: Good Progress towards PT goals: Progressing toward goals    Frequency    Min 3X/week      PT Plan Current plan remains appropriate       AM-PAC PT "6 Clicks" Mobility   Outcome Measure  Help needed turning from your back to your side while in a flat bed without using bedrails?: A Little Help needed moving from lying on your back to sitting on the side of a flat bed without using bedrails?: A Little Help needed moving to and from a bed to a chair (including a wheelchair)?: A Little Help needed standing up from a chair using your arms (e.g., wheelchair or bedside chair)?: A Little Help needed to walk in hospital room?: A Little Help needed climbing 3-5 steps with a railing? : A Lot 6 Click Score: 17    End of Session Equipment Utilized During Treatment: Gait belt Activity Tolerance: Patient  tolerated treatment well Patient left: in chair;with call bell/phone within reach (in bathroom) Nurse Communication: Mobility status PT Visit Diagnosis: Unsteadiness on feet (R26.81);Muscle weakness (generalized) (M62.81);Difficulty in walking, not elsewhere classified (R26.2);Pain Pain - part of body:  (abdomen)     Time: 1610-9604 PT Time Calculation (min) (ACUTE ONLY): 43 min  Charges:  $Gait Training: 8-22 mins $Therapeutic Exercise: 23-37 mins                     Vickki Muff, PT, DPT   Acute Rehabilitation Department Office [phone] Secure Chat Communication Preferred   Ronnie Derby 09/22/2022, 3:43 PM

## 2022-09-22 NOTE — Care Management Important Message (Signed)
Important Message  Patient Details  Name: Ricardo Fisher MRN: [id number] Date of Birth: 01/01/1956   Medicare Important Message Given:  Yes     Renie Ora 09/22/2022, 8:40 AM

## 2022-09-23 ENCOUNTER — Inpatient Hospital Stay (HOSPITAL_COMMUNITY): Payer: PPO

## 2022-09-23 LAB — TYPE AND SCREEN
ABO/RH(D): O NEG
Antibody Screen: NEGATIVE
Unit division: 0

## 2022-09-23 LAB — CBC
HCT: 25.3 % — ABNORMAL LOW (ref 39.0–52.0)
Hemoglobin: 8.3 g/dL — ABNORMAL LOW (ref 13.0–17.0)
MCH: 31.1 pg (ref 26.0–34.0)
MCHC: 32.8 g/dL (ref 30.0–36.0)
MCV: 94.8 fL (ref 80.0–100.0)
Platelets: 201 10*3/uL (ref 150–400)
RBC: 2.67 MIL/uL — ABNORMAL LOW (ref 4.22–5.81)
RDW: 13.2 % (ref 11.5–15.5)
WBC: 9.9 10*3/uL (ref 4.0–10.5)
nRBC: 0 % (ref 0.0–0.2)

## 2022-09-23 LAB — URINALYSIS, W/ REFLEX TO CULTURE (INFECTION SUSPECTED)
Bacteria, UA: NONE SEEN
Bilirubin Urine: NEGATIVE
Glucose, UA: NEGATIVE mg/dL
Ketones, ur: NEGATIVE mg/dL
Leukocytes,Ua: NEGATIVE
Nitrite: NEGATIVE
Protein, ur: NEGATIVE mg/dL
Specific Gravity, Urine: 1.019 (ref 1.005–1.030)
pH: 5 (ref 5.0–8.0)

## 2022-09-23 LAB — LACTIC ACID, PLASMA
Lactic Acid, Venous: 1.3 mmol/L (ref 0.5–1.9)
Lactic Acid, Venous: 1.3 mmol/L (ref 0.5–1.9)

## 2022-09-23 LAB — BASIC METABOLIC PANEL
Anion gap: 9 (ref 5–15)
BUN: 19 mg/dL (ref 8–23)
CO2: 27 mmol/L (ref 22–32)
Calcium: 8.1 mg/dL — ABNORMAL LOW (ref 8.9–10.3)
Chloride: 99 mmol/L (ref 98–111)
Creatinine, Ser: 1.1 mg/dL (ref 0.61–1.24)
GFR, Estimated: >60 mL/min (ref >60)
Glucose, Bld: 118 mg/dL — ABNORMAL HIGH (ref 70–99)
Potassium: 3.9 mmol/L (ref 3.5–5.1)
Sodium: 135 mmol/L (ref 135–145)

## 2022-09-23 LAB — BPAM RBC
Blood Product Expiration Date: 202406252359
Blood Product Expiration Date: 202406252359
Unit Type and Rh: 5100
Unit Type and Rh: 5100

## 2022-09-23 MED ORDER — POLYETHYLENE GLYCOL 3350 17 G PO PACK
17.0000 g | PACK | Freq: Every day | ORAL | Status: DC
  Administered 2022-09-23 – 2022-09-24 (×2): 17 g via ORAL
  Filled 2022-09-23 (×2): qty 1

## 2022-09-23 NOTE — Plan of Care (Signed)

## 2022-09-23 NOTE — Progress Notes (Signed)
Patient temp spiked 102.3, MEWS yellow 3, PA Rhyne aware, and RR, new orders placed. MEWS protocol started.

## 2022-09-23 NOTE — Progress Notes (Addendum)
  Progress Note    09/23/2022 9:14 AM 4 Days Post-Op  Subjective:  no major complaints. Feels he needs at least one more day. Still very sore across lower abdomen/groins and feels this is making it difficult for him to get up out of bed, climb stairs, get off commode, etc. Hopeful that working with PT today will give him some more confidence to d/c. Still no BM since surgery   Vitals:   09/22/22 2335 09/23/22 0323  BP: 102/67 105/65  Pulse: 92 79  Resp: 19 20  Temp: 98.8 F (37.1 C) 98.5 F (36.9 C)  SpO2: 99% 99%   Physical Exam: Cardiac:  regular Lungs:  non labored Incisions:  B groin incisions are clean,dry and intact without swelling or hematoma Extremities:  well perfused and warm with palpable DP pulses bilaterally Abdomen:  soft, non distended, tenderness present across RLQ/LLQ Neurologic: alert and oriented  CBC    Component Value Date/Time   WBC 9.7 09/22/2022 0923   RBC 2.50 (L) 09/22/2022 0923   HGB 8.0 (L) 09/22/2022 0923   HCT 23.5 (L) 09/22/2022 0923   PLT 131 (L) 09/22/2022 0923   MCV 94.0 09/22/2022 0923   MCH 32.0 09/22/2022 0923   MCHC 34.0 09/22/2022 0923   RDW 13.3 09/22/2022 0923   LYMPHSABS 1,264 03/18/2022 1044   MONOABS 0.9 02/11/2022 1027   EOSABS 112 03/18/2022 1044   BASOSABS 40 03/18/2022 1044    BMET    Component Value Date/Time   NA 136 09/21/2022 0135   K 4.1 09/21/2022 0135   CL 102 09/21/2022 0135   CO2 28 09/21/2022 0135   GLUCOSE 109 (H) 09/21/2022 0135   BUN 13 09/21/2022 0135   CREATININE 0.98 09/21/2022 0135   CREATININE 0.91 05/31/2015 0843   CALCIUM 8.0 (L) 09/21/2022 0135   GFRNONAA >60 09/21/2022 0135   GFRNONAA >89 05/31/2015 0843   GFRAA >89 05/31/2015 0843    INR    Component Value Date/Time   INR 1.4 (H) 09/19/2022 1228     Intake/Output Summary (Last 24 hours) at 09/23/2022 0914 Last data filed at 09/22/2022 1300 Gross per 24 hour  Intake --  Output 200 ml  Net -200 ml     Assessment/Plan:  67  y.o. male is s/p Aortobiiliac endograft and bilateral CFA endarterectomy with patch angioplasty, left external and common iliac artery thrombectomy  4 Days Post-Op   BLE remain well perfused and warm with palpable DP pulses Bilateral groin incisions are intact without any swelling or hematoma Scrotal ecchymosis improving May need hospital bed but wants to see how he does with PT today Continue Bowel regimen Encourage mobilization Anticipate d/c home tomorrow   Dory Horn [facility] [phone] 09/23/2022 9:14 AM  I have independently interviewed and examined patient and agree with PA assessment and plan above.   Altan Kraai C. Randie Heinz, MD [facility] [place] Office: [phone] Pager: [phone]

## 2022-09-23 NOTE — Progress Notes (Signed)
Daughter took [facility] and walker home for patient.

## 2022-09-24 LAB — CBC
HCT: 24.4 % — ABNORMAL LOW (ref 39.0–52.0)
Hemoglobin: 8.1 g/dL — ABNORMAL LOW (ref 13.0–17.0)
MCH: 32 pg (ref 26.0–34.0)
MCHC: 33.2 g/dL (ref 30.0–36.0)
MCV: 96.4 fL (ref 80.0–100.0)
Platelets: 224 10*3/uL (ref 150–400)
RBC: 2.53 MIL/uL — ABNORMAL LOW (ref 4.22–5.81)
RDW: 13.3 % (ref 11.5–15.5)
WBC: 8.8 10*3/uL (ref 4.0–10.5)
nRBC: 0 % (ref 0.0–0.2)

## 2022-09-24 LAB — CULTURE, BLOOD (ROUTINE X 2)

## 2022-09-24 NOTE — Progress Notes (Addendum)
  Progress Note    09/24/2022 8:32 AM 5 Days Post-Op  Subjective:  feeling better this morning. Groin pain improving. Still has not moved bowels   Vitals:   09/24/22 0600 09/24/22 0812  BP: (!) 112/59 97/60  Pulse: 93   Resp: 18   Temp: 98.8 F (37.1 C) 99.2 F (37.3 C)  SpO2: 92%    Physical Exam: Cardiac:  regular Lungs:  non labored Incisions:  B groin incisions are clean, dry and intact Extremities:  well perfused and warm with palpable DP pulses Abdomen:  soft, non distended, some tenderness across lower abdomen Neurologic: alert and oriented  CBC    Component Value Date/Time   WBC 9.9 09/23/2022 1748   RBC 2.67 (L) 09/23/2022 1748   HGB 8.3 (L) 09/23/2022 1748   HCT 25.3 (L) 09/23/2022 1748   PLT 201 09/23/2022 1748   MCV 94.8 09/23/2022 1748   MCH 31.1 09/23/2022 1748   MCHC 32.8 09/23/2022 1748   RDW 13.2 09/23/2022 1748   LYMPHSABS 1,264 03/18/2022 1044   MONOABS 0.9 02/11/2022 1027   EOSABS 112 03/18/2022 1044   BASOSABS 40 03/18/2022 1044    BMET    Component Value Date/Time   NA 135 09/23/2022 1748   K 3.9 09/23/2022 1748   CL 99 09/23/2022 1748   CO2 27 09/23/2022 1748   GLUCOSE 118 (H) 09/23/2022 1748   BUN 19 09/23/2022 1748   CREATININE 1.10 09/23/2022 1748   CREATININE 0.91 05/31/2015 0843   CALCIUM 8.1 (L) 09/23/2022 1748   GFRNONAA >60 09/23/2022 1748   GFRNONAA >89 05/31/2015 0843   GFRAA >89 05/31/2015 0843    INR    Component Value Date/Time   INR 1.4 (H) 09/19/2022 1228     Intake/Output Summary (Last 24 hours) at 09/24/2022 1610 Last data filed at 09/23/2022 2130 Gross per 24 hour  Intake 240 ml  Output --  Net 240 ml     Assessment/Plan:  67 y.o. male is s/p Aortobiiliac endograft and bilateral CFA endarterectomy with patch angioplasty, left external and common iliac artery thrombectomy  5 Days Post-Op   Febrile yesterday. Chest xray negative. UA negative Pending CBC this morning Afebrile now B groin incisions  intact and well appearing. No signs of infection BLE well perfused and warm with palpable DP pulses Continue Bowel regimen  Anticipate d/c later today if he can have BM Will have follow up arrange din 2-3 weeks for incision check   Ricardo Congress, PA-C [facility] [phone] 09/24/2022 8:32 AM  I have independently interviewed and examined patient and agree with PA assessment and plan above.  No source of fever readily identifiable.  Chest x-ray is clear does not have any swelling in his legs to suggest DVT and incisions are clean dry and intact.  Be healing well.  UA is negative and cultures are pending.  Continue with bowel regimen and activity as tolerated.  Krisanne Lich C. Randie Heinz, MD [facility] [place] Office: [phone] Pager: [phone]

## 2022-09-24 NOTE — Progress Notes (Addendum)
Physical Therapy Treatment Patient Details Name: Ricardo Fisher MRN: [id number] DOB: [birth year] Today's Date: 09/24/2022   History of Present Illness Patient is 67 y.o. male s/p Aortobiiliac endograft and bilateral CFA endarterectomy with patch angioplasty, left external and common iliac artery thrombectomy on 09/19/22. PMH significant for AAA, arthritis, anxiety, pancreatitis, GERD, HLD, HTN, Lt heart caht and coronary angiography in 2005.    PT Comments    Pt received in supine, agreeable to therapy session and with good participation and fair tolerance for transfer and gait training. Gait distance limited due to pt evolving fatigue/lethargy after attempting to toilet (pt unable to have BM), pt agreeable to sit up in recliner with encouragement. Pt c/o moderate to severe low back and incisional pain at end of session, RN notified, ice pack given for pain with instruction on frequency/removal. Pt encouraged to sit up 1-2 hours for improved pulmonary clearance and blinds open to improve day/night sleep/wake cycle to lower risk of delirium. HR ~100-110 bpm with exertion, BP soft (see below) post-gait trial. Pt continues to benefit from PT services to progress toward functional mobility goals.   Recommendations for follow up therapy are one component of a multi-disciplinary discharge planning process, led by the attending physician.  Recommendations may be updated based on patient status, additional functional criteria and insurance authorization.  Follow Up Recommendations       Assistance Recommended at Discharge Intermittent Supervision/Assistance  Patient can return home with the following A little help with walking and/or transfers;A little help with bathing/dressing/bathroom;Assistance with cooking/housework;Assist for transportation;Help with stairs or ramp for entrance   Equipment Recommendations  Rolling walker (2 wheels);BSC/3in1    Recommendations for Other Services        Precautions / Restrictions Precautions Precautions: Fall Precaution Comments: Pt reports eager to mobilize Restrictions Weight Bearing Restrictions: No     Mobility  Bed Mobility Overal bed mobility: Needs Assistance Bed Mobility: Rolling, Sidelying to Sit Rolling: Min guard Sidelying to sit: Min guard, HOB elevated       General bed mobility comments: cues for log roll technique and breathing with mobility. pt using bed rail and HOB elevated    Transfers Overall transfer level: Needs assistance Equipment used: Rolling walker (2 wheels) Transfers: Sit to/from Stand Sit to Stand: Min guard           General transfer comment: Cues for hand placement, bil UE use to power up from EOB/BSC heights; decreased carryover of cues for UE placement    Ambulation/Gait Ambulation/Gait assistance: Min guard, Min assist Gait Distance (Feet): 50 Feet (90ft + 17ft with seated break in bathroom) Assistive device: Rolling walker (2 wheels) Gait Pattern/deviations: Step-through pattern, Decreased step length - right, Decreased step length - left, Decreased stride length, Shuffle, Trunk flexed Gait velocity: decr     General Gait Details: Pt new RW arrived to room so adjusted this for proper height during session; repeated cues to improve posture and proximity to RW, pt does better with tactile cues  (hand over manubrium and lower back while walking to promote open chest and vcs not to look down at floor). Distance limited due to pt c/o fatigue and moderate to severe low back pain (RN notified).   Stairs Stairs:  (defer due to pt increased lethargy/fatigue after short distance in the room)           Wheelchair Mobility    Modified Rankin (Stroke Patients Only)       Balance Overall balance assessment: Needs assistance Sitting-balance  support: Feet supported Sitting balance-Leahy Scale: Good     Standing balance support: Reliant on assistive device for balance, During  functional activity, Bilateral upper extremity supported, Single extremity supported Standing balance-Leahy Scale: Fair Standing balance comment: flexed trunk posture in RW                            Cognition Arousal/Alertness: Lethargic Behavior During Therapy: WFL for tasks assessed/performed Overall Cognitive Status: No family/caregiver present to determine baseline cognitive functioning                                 General Comments: Tangential; increased time to process and perform tasks but very cooperative. More lethargic toward end of session while pt seated in chair. Pt c/o moderate to severe low back pain and expressed frustration with needing to use RW; reinforced aspect of RW use for pain mgmt and to increase standing/gait tolerance, pt receptive. Pt given emotional support through discussion and frequently recalling interactions with his fiancee (who passed away somewhat recently? But used to work for [employer]).         Exercises Other Exercises Other Exercises: vcs for LE ROM in bed/chair to reduce stiffness/pain prior to mobilizing for warm-up; pt too fatigued to follow cues and tangential so plan to review next session    General Comments General comments (skin integrity, edema, etc.): HR ~110 bpm with exertional tasks, reading Vtach prior to OOB mobility however per RN not true vtach and OK to mobilize. SpO2 WFL on RA; BP soft 92/52 (64) in recliner post-exertion. No dizziness reported but pt appearing more lethargic/fatigued sitting in chair.      Pertinent Vitals/Pain Pain Assessment Pain Assessment: Faces Faces Pain Scale: Hurts even more Pain Location: abdomen/groin from incisions and swelling Pain Descriptors / Indicators: Pressure, Discomfort, Grimacing Pain Intervention(s): Limited activity within patient's tolerance, Monitored during session, Repositioned, Premedicated before session     PT Goals (current goals can now be found in the  care plan section) Acute Rehab PT Goals Patient Stated Goal: recover strength and decrease abdominal pain so I can get back to work PT Goal Formulation: With patient Time For Goal Achievement: 10/05/22 Progress towards PT goals: Progressing toward goals (slowly)    Frequency    Min 3X/week      PT Plan Current plan remains appropriate       AM-PAC PT "6 Clicks" Mobility   Outcome Measure  Help needed turning from your back to your side while in a flat bed without using bedrails?: A Little Help needed moving from lying on your back to sitting on the side of a flat bed without using bedrails?: A Little Help needed moving to and from a bed to a chair (including a wheelchair)?: A Little Help needed standing up from a chair using your arms (e.g., wheelchair or bedside chair)?: A Little Help needed to walk in hospital room?: A Little Help needed climbing 3-5 steps with a railing? : A Lot 6 Click Score: 17    End of Session Equipment Utilized During Treatment: Gait belt Activity Tolerance: Patient tolerated treatment well;Patient limited by lethargy;Patient limited by pain Patient left: in chair;with call bell/phone within reach (no chair alarm in place, pad brought to room RN aware to place when pt presses call bell for return to bed.) Nurse Communication: Mobility status;Patient requests pain meds;Other (comment) (pt reluctant to ask  for pain meds, may want tylenol or non-opioid medicine; ice pack given; soft BP) PT Visit Diagnosis: Unsteadiness on feet (R26.81);Muscle weakness (generalized) (M62.81);Difficulty in walking, not elsewhere classified (R26.2);Pain Pain - part of body:  (abdomen)     Time: 1447-1530 PT Time Calculation (min) (ACUTE ONLY): 43 min  Charges:  $Gait Training: 8-22 mins $Therapeutic Activity: 8-22 mins                     Flynn Gwyn P., PTA Acute Rehabilitation Services Secure Chat Preferred 9a-5:30pm Office: [phone]    Dorathy Kinsman [facility] 09/24/2022,  3:46 PM

## 2022-09-24 NOTE — Progress Notes (Signed)
When entered pt's room for medication , Pt was shivering with chills. Pt said he is feeling cold. Vitals stable, no fever. Covered with extra blanket, denies any pain or discomfort. Scheduled Xanax administered. Shivering stopped couple minutes after he tool xanax. Call bell within reach, bed alarm on. Plan of care continues.

## 2022-09-25 ENCOUNTER — Other Ambulatory Visit (HOSPITAL_BASED_OUTPATIENT_CLINIC_OR_DEPARTMENT_OTHER): Payer: Self-pay

## 2022-09-25 LAB — CULTURE, BLOOD (ROUTINE X 2)
Culture: NO GROWTH
Culture: NO GROWTH

## 2022-09-25 MED ORDER — ASPIRIN 81 MG PO TBEC
81.0000 mg | DELAYED_RELEASE_TABLET | Freq: Every day | ORAL | 12 refills | Status: DC
  Filled 2022-09-25: qty 120, 90d supply, fill #0

## 2022-09-25 MED ORDER — OXYCODONE HCL 5 MG PO TABS
5.0000 mg | ORAL_TABLET | Freq: Four times a day (QID) | ORAL | 0 refills | Status: DC | PRN
  Filled 2022-09-25: qty 20, 5d supply, fill #0

## 2022-09-25 MED ORDER — ATORVASTATIN CALCIUM 20 MG PO TABS
20.0000 mg | ORAL_TABLET | Freq: Every day | ORAL | 11 refills | Status: DC
  Filled 2022-09-25: qty 30, 30d supply, fill #0
  Filled 2022-11-13: qty 30, 30d supply, fill #1
  Filled 2022-12-24: qty 30, 30d supply, fill #2

## 2022-09-25 NOTE — Progress Notes (Signed)
Discharge instructions given. Patient verbalized understanding and all questions were given.  

## 2022-09-25 NOTE — TOC Transition Note (Signed)
Transition of Care (TOC) - CM/SW Discharge Note Donn Pierini RN, BSN Transitions of Care Unit 4E- RN Case Manager See Treatment Team for direct phone #   Patient Details  Name: Ricardo Fisher MRN: [id number] Date of Birth: 1955/09/08  [facility] Care [facility] CM/SW Contact:  Darrold Span, RN Phone Number: 09/25/2022, 12:13 PM   Clinical Narrative:    Pt stable for transition home today, Pt has transportation home.   DME- has been delivered to room by [employer] and [facility]  HH- set up with [facility]- liaison notified for start of care  No further TOC needs noted.    Final next level of care: Home w Home Health Services Barriers to Discharge: Barriers Resolved   Patient Goals and CMS Choice CMS Medicare.gov Compare Post Acute Care list provided to:: Patient Choice offered to / list presented to : Patient  Discharge Placement                 Home w/ [facility]        Discharge Plan and Services Additional resources added to the After Visit Summary for     Discharge Planning Services: CM Consult Post Acute Care Choice: Home Health, Durable Medical Equipment          DME Arranged: Bedside commode, Walker rolling DME Agency: [facility] Date DME Agency Contacted: 09/22/22 Time DME Agency Contacted: 1230 Representative spoke with at DME Agency: Zollie Beckers HH Arranged: PT [facility] Agency: [facility] Date [facility] Agency Contacted: 09/22/22   Representative spoke with at [facility] Agency: Amy  Social Determinants of Health (SDOH) Interventions SDOH Screenings   Food Insecurity: No Food Insecurity (09/20/2022)  Housing: Low Risk  (09/20/2022)  Transportation Needs: No Transportation Needs (09/20/2022)  Utilities: Not At Risk (09/20/2022)  Alcohol Screen: Low Risk  (03/04/2018)  Depression (PHQ2-9): Low Risk  (05/19/2022)  Tobacco Use: High Risk (09/22/2022)     Readmission Risk Interventions     No data to display

## 2022-09-25 NOTE — Discharge Summary (Signed)
EVAR Discharge Summary   Ricardo Fisher 02-01-1956 67 y.o. male  MRN: [id number]  Admission Date: 09/19/2022  Discharge Date: 09/25/2022  Physician: Maeola Harman, MD  Admission Diagnosis: Abdominal aortic aneurysm (AAA) greater than 5.5 cm in diameter in male [facility]) [I71.40]  Hospital Course:  The patient was admitted to the hospital and taken to the operating room on 09/19/2022 and underwent: 1.  Percutaneous access and attempted closure bilateral common femoral arteries 2.  Catheter aorta and aortogram 3.  Aortobiiliac endograft with Gore conformable main body right 26 x 14 x 12 extended on the right with 16 x 7 mm, contralateral limb left 14 x 10 cm limb 4.  Bilateral common femoral endarterectomy with vein patch angioplasty 5.  Harvest bilateral greater saphenous veins 6.  Left external and common iliac artery thrombectomy with 4 Fogarty    The pt tolerated the procedure well and was transported to the PACU in good condition.   POD#1, his bilateral groin incisions remained intact. Bilateral lower extremities well perfused and warm. Pain well controlled. Blood pressure remained soft so continued to give IV fluids and encourage po intake. Hemodynamically stable. Renal function remained stable. Encouraged mobilization.   POD#2, incision continued to look well.Bilateral legs well perfused and warm with palpable DP pulses. Ordered PT for evaluation. Had some scrotal ecchymosis but no difficulty voiding. Remained hemodynamically stable. Still sore and feeling unsure about discharge home. Encouraged continued increased mobility  The remainder of the hospital course consisted of increasing mobilization, decreased need for post operative pain medication, bowel movement and increasing intake of solids without difficulty.  He remained stable and ready to discharge home on POD#6. His incisions were healing very well. Pain well controlled.Mobilizing much better. He will have home  health PT. Follow up arranged in 2-3 weeks for incision check. He will also have CTA arranged in 1 month with follow up with Dr. Randie Heinz.  CBC    Component Value Date/Time   WBC 8.8 09/24/2022 0915   RBC 2.53 (L) 09/24/2022 0915   HGB 8.1 (L) 09/24/2022 0915   HCT 24.4 (L) 09/24/2022 0915   PLT 224 09/24/2022 0915   MCV 96.4 09/24/2022 0915   MCH 32.0 09/24/2022 0915   MCHC 33.2 09/24/2022 0915   RDW 13.3 09/24/2022 0915   LYMPHSABS 1,264 03/18/2022 1044   MONOABS 0.9 02/11/2022 1027   EOSABS 112 03/18/2022 1044   BASOSABS 40 03/18/2022 1044    BMET    Component Value Date/Time   NA 135 09/23/2022 1748   K 3.9 09/23/2022 1748   CL 99 09/23/2022 1748   CO2 27 09/23/2022 1748   GLUCOSE 118 (H) 09/23/2022 1748   BUN 19 09/23/2022 1748   CREATININE 1.10 09/23/2022 1748   CREATININE 0.91 05/31/2015 0843   CALCIUM 8.1 (L) 09/23/2022 1748   GFRNONAA >60 09/23/2022 1748   GFRNONAA >89 05/31/2015 0843   GFRAA >89 05/31/2015 0843       Discharge Instructions     ABDOMINAL PROCEDURE/ANEURYSM REPAIR/AORTO-BIFEMORAL BYPASS:  Call MD for increased abdominal pain; cramping diarrhea; nausea/vomiting   Complete by: As directed    Call MD for:  redness, tenderness, or signs of infection (pain, swelling, bleeding, redness, odor or green/yellow discharge around incision site)   Complete by: As directed    Call MD for:  severe or increased pain, loss or decreased feeling  in affected limb(s)   Complete by: As directed    Call MD for:  temperature >100.5   Complete  by: As directed    Driving Restrictions   Complete by: As directed    No driving while taking narcotic pain medication   Increase activity slowly   Complete by: As directed    Walk with assistance use walker or cane as needed   Lifting restrictions   Complete by: As directed    No lifting for 4-6 weeks   Resume previous diet   Complete by: As directed    may wash over wound with mild soap and water   Complete by: As  directed        Discharge Diagnosis:  Abdominal aortic aneurysm (AAA) greater than 5.5 cm in diameter in male [facility]) [I71.40]  Secondary Diagnosis: Patient Active Problem List   Diagnosis Date Noted   Abdominal aortic aneurysm (AAA) greater than 5.5 cm in diameter in male (HCC) 09/19/2022   Preoperative cardiovascular examination 08/10/2022   Infrarenal abdominal aortic aneurysm (AAA) without rupture (HCC) 04/01/2022   Dilated bile ducts - chronic 04/01/2022   Dilated pancreatic duct with EUS criteria for chronic pancreatitis met 04/01/2022   Fat pad syndrome 09/16/2016   Repetitive strain injury of foot, initial encounter 09/16/2016   Bilateral foot pain 09/16/2016   Cigarette smoker 01/30/2015   PCP NOTES >>> 01/22/2015   Annual physical exam 01/14/2013   Allergic rhinitis 01/14/2013   URINARY TRACT INFECTION SITE NOT SPECIFIED 04/25/2010   Hypogonadism in male 12/18/2009   ACNE ROSACEA 03/27/2009   Hyperlipidemia 09/04/2006   Essential hypertension 09/04/2006   Anxiety 08/13/2006   CARRIER, VIRAL HEPATITIS C 08/13/2006   Past Medical History:  Diagnosis Date   AAA (abdominal aortic aneurysm) (HCC)    Allergic rhinitis 01/14/2013   Allergy    Anxiety    Arthritis    Chronic pancreatitis (HCC)    GERD (gastroesophageal reflux disease)    Hx of adenomatous colonic polyps    Hyperlipemia    Hypertension    Hypogonadism male 01/2010   Substance abuse (HCC)    teenage years   UTI (lower urinary tract infection) 02/2010   h/o   Viral hepatitis C carrier (HCC)    h/o blood transfusion, s/p 2 liver Bx- second one (aprox 2004) was stable      Allergies as of 09/25/2022       Reactions   Doxycycline Diarrhea   Tongue "on fire"   Hydrochlorothiazide    REACTION: rash        Medication List     TAKE these medications    ALPRAZolam 1 MG tablet Commonly known as: XANAX Take 1 mg by mouth in the morning, at noon, in the evening, and at bedtime.   ascorbic  acid 500 MG tablet Commonly known as: VITAMIN C Take 500 mg by mouth daily.   aspirin EC 81 MG tablet Take 1 tablet (81 mg total) by mouth daily at 6 (six) AM. Swallow whole.   atorvastatin 20 MG tablet Commonly known as: LIPITOR Take 1 tablet (20 mg total) by mouth daily.   CAL-MAG-ZINC PO Take 1 tablet by mouth daily.   clindamycin 1 % lotion Commonly known as: CLEOCIN T Apply on to the affected area of the skin if needed as directed   ezetimibe 10 MG tablet Commonly known as: Zetia Take 1 tablet (10 mg total) by mouth daily. What changed: how much to take   METHADONE HCL PO Take 2 mg by mouth daily.   methadone 5 MG tablet Commonly known as: DOLOPHINE Take 1 tablet (5  mg total) by mouth daily.   metoprolol succinate 25 MG 24 hr tablet Commonly known as: TOPROL-XL Take 0.5 tablets (12.5 mg total) by mouth daily.   oxyCODONE 5 MG immediate release tablet Commonly known as: Oxy IR/ROXICODONE Take 1 tablet (5 mg total) by mouth every 6 (six) hours as needed for severe pain.   pantoprazole 40 MG tablet Commonly known as: PROTONIX Take 1 tablet (40 mg total) by mouth daily before breakfast. What changed:  when to take this reasons to take this   Potassium 99 MG Tabs Take 49.5 mg by mouth daily as needed (Summer).   Testosterone 1.62 % Gel Apply 3 pumps topically to affected area daily.   valACYclovir 1000 MG tablet Commonly known as: VALTREX Take 2 tablets (2,000 mg total) by mouth 2 (two) times daily as needed. For each 1 day of episodes   vitamin B-12 100 MCG tablet Commonly known as: CYANOCOBALAMIN Take 100 mcg by mouth daily.               Durable Medical Equipment  (From admission, onward)           Start     Ordered   09/22/22 0742  For home use only DME 3 n 1  Once        09/22/22 0743   09/22/22 0741  For home use only DME Walker rolling  Once       Question Answer Comment  Walker: With 5 Inch Wheels   Patient needs a walker to  treat with the following condition S/P AAA repair      09/22/22 0743            Discharge Instructions:   [facility]  Discharge Instructions Endovascular Aortic Aneurysm Repair  Please refer to the following instructions for your post-procedure care. Your surgeon or Physician Assistant will discuss any changes with you.  Activity  You are encouraged to walk as much as you can. You can slowly return to normal activities but must avoid strenuous activity and heavy lifting until your doctor tells you it's OK. Avoid activities such as vacuuming or swinging a gold club. It is normal to feel tired for several weeks after your surgery. Do not drive until your doctor gives the OK and you are no longer taking prescription pain medications. It is also normal to have difficulty with sleep habits, eating, and bowel movements after surgery. These will go away with time.  Bathing/Showering  You may shower after you go home. If you have an incision, do not soak in a bathtub, hot tub, or swim until the incision heals completely.  Incision Care  Shower every day. Clean your incision with mild soap and water. Pat the area dry with a clean towel. You do not need a bandage unless otherwise instructed. Do not apply any ointments or creams to your incision. If you clothing is irritating, you may cover your incision with a dry gauze pad.  Diet  Resume your normal diet. There are no special food restrictions following this procedure. A low fat/low cholesterol diet is recommended for all patients with vascular disease. In order to heal from your surgery, it is CRITICAL to get adequate nutrition. Your body requires vitamins, minerals, and protein. Vegetables are the best source of vitamins and minerals. Vegetables also provide the perfect balance of protein. Processed food has little nutritional value, so try to avoid this.  Medications  Resume taking all of your medications  unless your doctor or Physician Assistnat tells you not to. If your incision is causing pain, you may take over-the-counter pain relievers such as acetaminophen (Tylenol). If you were prescribed a stronger pain medication, please be aware these medications can cause nausea and constipation. Prevent nausea by taking the medication with a snack or meal. Avoid constipation by drinking plenty of fluids and eating foods with a high amount of fiber, such as fruits, vegetables, and grains. Do not take Tylenol if you are taking prescription pain medications.   Follow up  Our office will schedule a follow-up appointment with a C.T. scan 3-4 weeks after your surgery.  Please call us immediately for any of the following conditions  Severe or worsening pain in your legs or feet or in your abdomen back or chest. Increased pain, redness, drainage (pus) from your incision sit. Increased abdominal pain, bloating, nausea, vomiting or persistent diarrhea. Fever of 101 degrees or higher. Swelling in your leg (s),  Reduce your risk of vascular disease  Stop smoking. If you would like help call [facility] at 1-800-QUIT-NOW [phone]) 133-3395) or [place] at [phone]. Manage your cholesterol Maintain a desired weight Control your diabetes Keep your blood pressure down  If you have questions, please call the office at [phone].    Prescriptions given: Aspirin 81 mg #30 11 Refills Atorvastatin 20 mg #20 11 Refills Oxycodone 5 mg #20 No refills  Disposition: Home  Patient's condition: is Good  Follow up: 1. Dr. Randie Heinz in 4 weeks with CTA protocol   Graceann Congress, PA-C [facility] [phone] 09/25/2022  9:50 AM   - For VQI Registry use - Post-op:  Time to Extubation: [X]  In OR, [ ]  < 12 hrs, [ ]  12-24 hrs, [ ]  >=24 hrs Vasopressors Req. Post-op: No MI: No., [ ]  Troponin only, [ ]  EKG or Clinical New Arrhythmia: No CHF: No ICU Stay: 0 days Transfusion: Yes     If  yes, 4 units given  Complications: Resp failure: No., [ ]  Pneumonia, [ ]  Ventilator Chg in renal function: No., [ ]  Inc. Cr > 0.5, [ ]  Temp. Dialysis,  [ ]  Permanent dialysis Leg ischemia: No., no Surgery needed, [ ]  Yes, Surgery needed,  [ ]  Amputation Bowel ischemia: No., [ ]  Medical Rx, [ ]  Surgical Rx Wound complication: No., [ ]  Superficial separation/infection, [ ]  Return to OR Return to OR: No  Return to OR for bleeding: No Stroke: No., [ ]  Minor, [ ]  Major  Discharge medications: Statin use:  Yes  ASA use:  Yes  Plavix use:  No  Beta blocker use:  Yes  ARB use:  No ACEI use:  No CCB use:  No

## 2022-09-25 NOTE — Progress Notes (Addendum)
  Progress Note    09/25/2022 7:50 AM 6 Days Post-Op  Subjective:  feeling ready to go home. Had BM this morning   Vitals:   09/24/22 2352 09/25/22 0259  BP: 102/71 90/63  Pulse: 86 78  Resp: 20 20  Temp: 98.5 F (36.9 C) 98.6 F (37 C)  SpO2: 99% 100%   Physical Exam: Cardiac:  regular Lungs:  non labored Incisions:  B groin incisions are intact and well appearing, without swelling or hematoma Extremities:  BLE well perfused and warm with palpable DP pulses Abdomen:  flat, soft Neurologic: alert and oriented  CBC    Component Value Date/Time   WBC 8.8 09/24/2022 0915   RBC 2.53 (L) 09/24/2022 0915   HGB 8.1 (L) 09/24/2022 0915   HCT 24.4 (L) 09/24/2022 0915   PLT 224 09/24/2022 0915   MCV 96.4 09/24/2022 0915   MCH 32.0 09/24/2022 0915   MCHC 33.2 09/24/2022 0915   RDW 13.3 09/24/2022 0915   LYMPHSABS 1,264 03/18/2022 1044   MONOABS 0.9 02/11/2022 1027   EOSABS 112 03/18/2022 1044   BASOSABS 40 03/18/2022 1044    BMET    Component Value Date/Time   NA 135 09/23/2022 1748   K 3.9 09/23/2022 1748   CL 99 09/23/2022 1748   CO2 27 09/23/2022 1748   GLUCOSE 118 (H) 09/23/2022 1748   BUN 19 09/23/2022 1748   CREATININE 1.10 09/23/2022 1748   CREATININE 0.91 05/31/2015 0843   CALCIUM 8.1 (L) 09/23/2022 1748   GFRNONAA >60 09/23/2022 1748   GFRNONAA >89 05/31/2015 0843   GFRAA >89 05/31/2015 0843    INR    Component Value Date/Time   INR 1.4 (H) 09/19/2022 1228     Intake/Output Summary (Last 24 hours) at 09/25/2022 0750 Last data filed at 09/24/2022 1400 Gross per 24 hour  Intake --  Output 400 ml  Net -400 ml     Assessment/Plan:  67 y.o. male is s/p  Aortobiiliac endograft and bilateral CFA endarterectomy with patch angioplasty, left external and common iliac artery thrombectomy   6 Days Post-Op   BLE well perfused and warm with palpable DP pulses bilaterally Incisions intact and well appearing Pain well controlled Afebrile VSS HH PT orders  placed He is stable for discharge home today He will follow up in 2-3 weeks for incision check  Graceann Congress, PA-C [facility] [phone] 09/25/2022 7:50 AM  I have independently interviewed and examined patient yesterday and agree with PA assessment and plan above. Appropriate for d/c.  Kambri Dismore C. Randie Heinz, MD [facility] [place] Office: [phone] Pager: [phone]

## 2022-09-26 ENCOUNTER — Telehealth: Payer: Self-pay | Admitting: *Deleted

## 2022-09-26 ENCOUNTER — Encounter: Payer: Self-pay | Admitting: *Deleted

## 2022-09-26 DIAGNOSIS — M1990 Unspecified osteoarthritis, unspecified site: Secondary | ICD-10-CM | POA: Diagnosis not present

## 2022-09-26 DIAGNOSIS — K861 Other chronic pancreatitis: Secondary | ICD-10-CM | POA: Diagnosis not present

## 2022-09-26 DIAGNOSIS — E785 Hyperlipidemia, unspecified: Secondary | ICD-10-CM | POA: Diagnosis not present

## 2022-09-26 DIAGNOSIS — I10 Essential (primary) hypertension: Secondary | ICD-10-CM | POA: Diagnosis not present

## 2022-09-26 DIAGNOSIS — Z48812 Encounter for surgical aftercare following surgery on the circulatory system: Secondary | ICD-10-CM | POA: Diagnosis not present

## 2022-09-26 DIAGNOSIS — Z79891 Long term (current) use of opiate analgesic: Secondary | ICD-10-CM | POA: Diagnosis not present

## 2022-09-26 DIAGNOSIS — F419 Anxiety disorder, unspecified: Secondary | ICD-10-CM | POA: Diagnosis not present

## 2022-09-26 DIAGNOSIS — Z7982 Long term (current) use of aspirin: Secondary | ICD-10-CM | POA: Diagnosis not present

## 2022-09-26 NOTE — Transitions of Care (Post Inpatient/ED Visit) (Signed)
   09/26/2022  Name: RUPERT AZZARA MRN: [id number] DOB: 1955/07/08  Today's TOC FU Call Status: Today's TOC FU Call Status:: Unsuccessul Call (1st Attempt) Unsuccessful Call (1st Attempt) Date: 09/26/22  Attempted to reach the patient regarding the most recent Inpatient visit; left HIPAA compliant voice message requesting call back  Follow Up Plan: Additional outreach attempts will be made to reach the patient to complete the Transitions of Care (Post Inpatient visit) call.   Caryl Pina, RN, BSN, CCRN Alumnus RN CM Care Coordination/ Transition of Care- [facility] Care Management [phone]: direct office

## 2022-09-28 DIAGNOSIS — F1120 Opioid dependence, uncomplicated: Secondary | ICD-10-CM | POA: Diagnosis not present

## 2022-09-28 LAB — CULTURE, BLOOD (ROUTINE X 2)
Special Requests: ADEQUATE
Special Requests: ADEQUATE

## 2022-09-29 ENCOUNTER — Other Ambulatory Visit (HOSPITAL_BASED_OUTPATIENT_CLINIC_OR_DEPARTMENT_OTHER): Payer: Self-pay

## 2022-09-29 ENCOUNTER — Telehealth: Payer: Self-pay

## 2022-09-29 ENCOUNTER — Telehealth: Payer: Self-pay | Admitting: Physician Assistant

## 2022-09-29 ENCOUNTER — Other Ambulatory Visit: Payer: Self-pay

## 2022-09-29 DIAGNOSIS — I7143 Infrarenal abdominal aortic aneurysm, without rupture: Secondary | ICD-10-CM

## 2022-09-29 NOTE — Transitions of Care (Post Inpatient/ED Visit) (Signed)
   09/29/2022  Name: Ricardo Fisher MRN: [id number] DOB: [birth year]  Today's TOC FU Call Status: Today's TOC FU Call Status:: Unsuccessful Call (2nd Attempt) Unsuccessful Call (2nd Attempt) Date: 09/29/22  Attempted to reach the patient regarding the most recent Inpatient/ED visit.  Follow Up Plan: Additional outreach attempts will be made to reach the patient to complete the Transitions of Care (Post Inpatient/ED visit) call.     Antionette Fairy, RN,BSN,CCM [facility] Care Management Care Management Community Coordinator Direct Phone: [phone] Toll Free: [phone] Fax: [phone]

## 2022-09-29 NOTE — Telephone Encounter (Signed)
-----   Message from Graceann Congress, [place] sent at 09/21/2022 10:02 AM EDT ----- S/p EVAR with bilateral groin cutdowns and CF endarterectomies by Dr. Randie Heinz. He needs follow up in 3-4 weeks with CTA abd/ pelvis (EVAR protocol) and to see Dr. Randie Heinz. Thanks

## 2022-09-29 NOTE — Transitions of Care (Post Inpatient/ED Visit) (Signed)
09/29/2022  Name: Ricardo Fisher MRN: [id number] DOB: 1955/07/22  Today's TOC FU Call Status: Today's TOC FU Call Status:: Successful TOC FU Call Competed TOC FU Call Complete Date: 09/29/22 (Incoming call from patient returning RN CM call.)  Transition Care Management Follow-up Telephone Call Date of Discharge: 09/25/22 Discharge Facility: [facility]) Type of Discharge: Inpatient Admission Primary Inpatient Discharge Diagnosis:: "surgical AAA repair" How have you been since you were released from the hospital?: Better Any questions or concerns?: No  Items Reviewed: Did you receive and understand the discharge instructions provided?: Yes Medications obtained,verified, and reconciled?: Yes (Medications Reviewed) (Pt has not taken any ASA since discharge home-reviewed with pt and he will begin taking) Any new allergies since your discharge?: No Dietary orders reviewed?: Yes Type of Diet Ordered:: low salt/heart healthy  Medications Reviewed Today: Medications Reviewed Today     Reviewed by Charlyn Minerva, RN (Registered Nurse) on 09/29/22 at 1132  Med List Status: <None>   Medication Order Taking? Sig Documenting Provider Last Dose Status Informant  ALPRAZolam (XANAX) 1 MG tablet [id number] Yes Take 1 mg by mouth in the morning, at noon, in the evening, and at bedtime. [provider] Taking Active Self, Pharmacy Records  ascorbic acid (VITAMIN C) 500 MG tablet [id number] Yes Take 500 mg by mouth daily. [provider] Taking Active Self, Pharmacy Records  aspirin EC 81 MG tablet [id number]  Take 1 tablet (81 mg total) by mouth daily at 6 (six) AM. Swallow whole. Baglia, Corrina, PA-C  Active   atorvastatin (LIPITOR) 20 MG tablet [id number] Yes Take 1 tablet (20 mg total) by mouth daily. Graceann Congress, PA-C Taking Active   Calcium-Magnesium-Zinc (CAL-MAG-ZINC PO) [id number] Yes Take 1 tablet by mouth daily. [provider] Taking Active Self,  Pharmacy Records  clindamycin (CLEOCIN T) 1 % lotion [id number] Yes Apply on to the affected area of the skin if needed as directed Wanda Plump, MD Taking Active Self, Pharmacy Records  ezetimibe (ZETIA) 10 MG tablet [id number] Yes Take 1 tablet (10 mg total) by mouth daily.  Patient taking differently: Take 5 mg by mouth daily.   Wanda Plump, MD Taking Active Self, Pharmacy Records  methadone (DOLOPHINE) 5 MG tablet [id number] No Take 1 tablet (5 mg total) by mouth daily.  Patient not taking: Reported on 09/10/2022   Wanda Plump, MD Not Taking Active Self, Pharmacy Records  METHADONE HCL PO [id number] No Take 2 mg by mouth daily.  Patient not taking: Reported on 09/29/2022   [provider] Not Taking Active Self, Pharmacy Records  metoprolol succinate (TOPROL-XL) 25 MG 24 hr tablet [id number] Yes Take 0.5 tablets (12.5 mg total) by mouth daily. Wanda Plump, MD Taking Active Self, Pharmacy Records  oxyCODONE (OXY IR/ROXICODONE) 5 MG immediate release tablet [id number] Yes Take 1 tablet (5 mg total) by mouth every 6 (six) hours as needed for severe pain. Baglia, Corrina, PA-C Taking Active   pantoprazole (PROTONIX) 40 MG tablet [id number] Yes Take 1 tablet (40 mg total) by mouth daily before breakfast.  Patient taking differently: Take 40 mg by mouth daily as needed (stomach).   Iva Boop, MD Taking Active Self, Pharmacy Records  Potassium 99 MG TABS [id number] Yes Take 49.5 mg by mouth daily as needed (Summer). [provider] Taking Active Self, Pharmacy Records           Med Note (RATLIFF, THURSHELL   Tue Apr 01, 2022  8:55 AM) As needed  Testosterone 1.62 % GEL [id number] Yes Apply 3 pumps topically to affected area daily. Wanda Plump, MD Taking Active Self, Pharmacy Records  valACYclovir (VALTREX) 1000 MG tablet [id number] No Take 2 tablets (2,000 mg total) by mouth 2 (two) times daily as needed. For each 1 day of episodes  Patient not taking: Reported on 09/29/2022   Wanda Plump, MD Not Taking Active Self, Pharmacy Records           Med Note [facility]   Tue Nov 26, 2021  3:07 PM) PRN  vitamin B-12 (CYANOCOBALAMIN) 100 MCG tablet [id number] Yes Take 100 mcg by mouth daily. [provider] Taking Active Self, Pharmacy Records            Home Care and Equipment/Supplies: Were Home Health Services Ordered?: Yes Name of Home Health Agency:: [facility] Has Agency set up a time to come to your home?: Yes First Home Health Visit Date: 09/28/22 Any new equipment or medical supplies ordered?: Yes Name of Medical supply agency?: [facility] Were you able to get the equipment/medical supplies?: Yes Do you have any questions related to the use of the equipment/supplies?: No  Functional Questionnaire: Do you need assistance with bathing/showering or dressing?: No Do you need assistance with meal preparation?: No Do you need assistance with eating?: No Do you have difficulty maintaining continence: No Do you need assistance with getting out of bed/getting out of a chair/moving?: No Do you have difficulty managing or taking your medications?: No  Follow up appointments reviewed: PCP Follow-up appointment confirmed?: Yes MD Provider Line [phone] Given: No Date of PCP follow-up appointment?: 10/15/22 Follow-up Provider: Dr. Drue Novel [facility] Follow-up appointment confirmed?: No Reason Specialist Follow-Up Not Confirmed: Patient has Specialist Provider Number and will Call for Appointment (pt will call surgeon office to make appt today) Do you need transportation to your follow-up appointment?: No Do you understand care options if your condition(s) worsen?: Yes-patient verbalized understanding   TOC Interventions Today    Flowsheet Row Most Recent Value  TOC Interventions   TOC Interventions Discussed/Reviewed TOC Interventions Discussed, Post op wound/incision care, Post discharge activity limitations per provider, Arranged PCP  follow up less than 12 days/Care Guide scheduled      Interventions Today    Flowsheet Row Most Recent Value  General Interventions   General Interventions Discussed/Reviewed General Interventions Discussed, Doctor Visits  Doctor Visits Discussed/Reviewed PCP, Doctor Visits Discussed, Specialist  PCP/Specialist Visits Compliance with follow-up visit  Education Interventions   Education Provided Provided Education  Provided Verbal Education On Nutrition, When to see the doctor, Medication  Nutrition Interventions   Nutrition Discussed/Reviewed Nutrition Discussed, Adding fruits and vegetables, Decreasing salt  Pharmacy Interventions   Pharmacy Dicussed/Reviewed Pharmacy Topics Discussed, Medications and their functions  Safety Interventions   Safety Discussed/Reviewed Safety Discussed        Alessandra Grout [facility] Care Management Care Management Community Coordinator Direct Phone: [phone] Toll Free: [phone] Fax: [phone]

## 2022-09-30 ENCOUNTER — Other Ambulatory Visit: Payer: PPO

## 2022-09-30 ENCOUNTER — Telehealth: Payer: Self-pay | Admitting: Internal Medicine

## 2022-09-30 ENCOUNTER — Other Ambulatory Visit (INDEPENDENT_AMBULATORY_CARE_PROVIDER_SITE_OTHER): Payer: PPO

## 2022-09-30 ENCOUNTER — Telehealth: Payer: Self-pay

## 2022-09-30 ENCOUNTER — Other Ambulatory Visit (HOSPITAL_BASED_OUTPATIENT_CLINIC_OR_DEPARTMENT_OTHER): Payer: Self-pay

## 2022-09-30 ENCOUNTER — Ambulatory Visit (INDEPENDENT_AMBULATORY_CARE_PROVIDER_SITE_OTHER): Payer: PPO | Admitting: Physician Assistant

## 2022-09-30 VITALS — BP 98/63 | HR 81 | Temp 98.0°F | Resp 18 | Ht 69.0 in | Wt 153.1 lb

## 2022-09-30 DIAGNOSIS — R399 Unspecified symptoms and signs involving the genitourinary system: Secondary | ICD-10-CM | POA: Diagnosis not present

## 2022-09-30 DIAGNOSIS — I7143 Infrarenal abdominal aortic aneurysm, without rupture: Secondary | ICD-10-CM

## 2022-09-30 MED ORDER — SULFAMETHOXAZOLE-TRIMETHOPRIM 800-160 MG PO TABS
1.0000 | ORAL_TABLET | Freq: Two times a day (BID) | ORAL | 0 refills | Status: DC
  Filled 2022-09-30: qty 14, 7d supply, fill #0

## 2022-09-30 NOTE — Telephone Encounter (Signed)
Orders placed. Please schedule lab appt

## 2022-09-30 NOTE — Telephone Encounter (Signed)
See other telephone notes 

## 2022-09-30 NOTE — Telephone Encounter (Signed)
Pt called stating that he was having some issues with burning during urination and pt was recently discharged from hospital. Pt was reccommended by other provider that he probably should get a urine culture and urinalysis done to see what is going on with that to ensure no infection is picked up.

## 2022-09-30 NOTE — Telephone Encounter (Signed)
Wanda Plump, MD routed this conversation to Me       09/30/22 10:12 AM Wanda Plump, MD routed this conversation to Me Wanda Plump, MD      09/30/22 10:12 AM Note The patient will reach out to this office, having  UTI symptoms?. Plan: - UA, urine culture. Dx  UTI.  To be done today. - Needs hospital follow-up, scheduled for 10/06/2022. - If severe symptoms, fever or chills needs to be seen sooner

## 2022-09-30 NOTE — Telephone Encounter (Signed)
Needs appt

## 2022-09-30 NOTE — Addendum Note (Signed)
Addended byConrad Terril D on: 09/30/2022 10:17 AM   Modules accepted: Orders

## 2022-09-30 NOTE — Telephone Encounter (Signed)
Returned pt's call regarding his c/o L groin with small area in fold that is staying moist and has "slight pus". He states the rest of incision looks good. He also states he had possible UTI in hospital and has a history of them. He does not think he has one now but did feel "feverish last night" and stated he has back pain. He has been advised to keep an eye on the groin incision and wash with mild soap and keep it dry with a piece of gauze over the area. Pt has also been advised to call his PCP to get his urine checked. He said he will call them now and will call us back if he needs further assistance.

## 2022-09-30 NOTE — Progress Notes (Signed)
POST OPERATIVE OFFICE NOTE    CC:  F/u for surgery  HPI:  This is a 67 y.o. male who is s/p endovascular repair of abdominal aortic aneurysm with bilateral common femoral artery endarterectomy with vein patch angioplasty and left external and common iliac artery thrombectomy by Dr. Randie Heinz on 09/19/2022.  Surgery was complicated by bleeding from the right groin access site which required cutdown and extensive iliofemoral endarterectomy and vein patch angioplasty.  Left groin cutdown was also necessary due to thrombosis of the left iliac system requiring cutdown with iliofemoral endarterectomy and vein patch angioplasty as well as thrombectomy of the left lower extremity.  He was added as a triage visit today due to concern for right groin incision infection.  He denies any drainage however complains of fullness in the right groin as well as redness and small opening at the proximal aspect of the incision.  He is also concerned about UTI due to discoloration of his urine with fevers and is scheduled to see his PCP after our visit today.  He denies any rest pain or tissue loss of bilateral lower extremities.  He believes the left groin incision is healing well.  He denies any abdomen and back pain.  He is on a daily aspirin and statin.  Allergies  Allergen Reactions   Doxycycline Diarrhea    Tongue "on fire"   Hydrochlorothiazide     REACTION: rash    Current Outpatient Medications  Medication Sig Dispense Refill   ALPRAZolam (XANAX) 1 MG tablet Take 1 mg by mouth in the morning, at noon, in the evening, and at bedtime.     ascorbic acid (VITAMIN C) 500 MG tablet Take 500 mg by mouth daily.     aspirin EC 81 MG tablet Take 1 tablet (81 mg total) by mouth daily at 6 (six) AM. Swallow whole. 120 tablet 12   atorvastatin (LIPITOR) 20 MG tablet Take 1 tablet (20 mg total) by mouth daily. 30 tablet 11   Calcium-Magnesium-Zinc (CAL-MAG-ZINC PO) Take 1 tablet by mouth daily.     clindamycin (CLEOCIN T) 1  % lotion Apply on to the affected area of the skin if needed as directed 60 mL 3   ezetimibe (ZETIA) 10 MG tablet Take 1 tablet (10 mg total) by mouth daily. (Patient taking differently: Take 5 mg by mouth daily.) 90 tablet 1   methadone (DOLOPHINE) 5 MG tablet Take 1 tablet (5 mg total) by mouth daily.     METHADONE HCL PO Take 2 mg by mouth daily.     metoprolol succinate (TOPROL-XL) 25 MG 24 hr tablet Take 0.5 tablets (12.5 mg total) by mouth daily. 45 tablet 1   oxyCODONE (OXY IR/ROXICODONE) 5 MG immediate release tablet Take 1 tablet (5 mg total) by mouth every 6 (six) hours as needed for severe pain. 20 tablet 0   pantoprazole (PROTONIX) 40 MG tablet Take 1 tablet (40 mg total) by mouth daily before breakfast. (Patient taking differently: Take 40 mg by mouth daily as needed (stomach).) 90 tablet 1   Potassium 99 MG TABS Take 49.5 mg by mouth daily as needed (Summer).     sulfamethoxazole-trimethoprim (BACTRIM DS) 800-160 MG tablet Take 1 tablet by mouth 2 (two) times daily. 14 tablet 0   Testosterone 1.62 % GEL Apply 3 pumps topically to affected area daily. 150 g 5   valACYclovir (VALTREX) 1000 MG tablet Take 2 tablets (2,000 mg total) by mouth 2 (two) times daily as needed. For each 1  day of episodes 30 tablet 2   vitamin B-12 (CYANOCOBALAMIN) 100 MCG tablet Take 100 mcg by mouth daily.     No current facility-administered medications for this visit.     ROS:  See HPI  Physical Exam:  Vitals:   09/30/22 1320  BP: 98/63  Pulse: 81  Resp: 18  Temp: 98 F (36.7 C)  TempSrc: Temporal  SpO2: 99%  Weight: 153 lb 1.6 oz (69.4 kg)  Height: 5\' 9"  (1.753 m)    Incision: Left groin incision healing well; right groin incision with proximal pole dehiscence of about half a centimeter with fibrinous exudate and low intensity surrounding erythema but no drainage or purulence; right groin with fullness and right ankle with pitting edema Extremities: Palpable anterior tibial artery pulses  bilaterally Neuro: A&O Abdomen: Soft, nontender, nondistended  Assessment/Plan:  This is a 67 y.o. male who is s/p: Endovascular repair of abdominal aortic aneurysm requiring bilateral common femoral artery endarterectomies with vein patch angioplasties  -[name] is a 67 year old male added on as a triage visit today due to concern for right groin infection.  Bilateral lower extremities are well-perfused with palpable ATA pulses.  Left groin incision appears to be healing well.  He does have some fullness in the right groin.  He has minimal dehiscence of the proximal pole of the incision with some fibrinous exudate which was debrided.  He does have mild surrounding erythema in this area as well.  Fortunately he has a vein patch on the artery however we will cover patient with a 1 week supply of Bactrim.  This will also treat his suspected urinary infection.  I encouraged the patient to cleanse the groin incisions with soap and water daily and pat dry.  He will also use a 4 x 4 gauze to wick moisture from the incision without tape.  He has follow-up with Dr. Randie Heinz in another 2 weeks with a CTA abdomen and pelvis.  He will call/return office sooner if he notices any worsening of the incision.   Emilie Rutter, PA-C [facility] [phone]  Clinic MD:  [facility]

## 2022-09-30 NOTE — Telephone Encounter (Signed)
Pt called back after scheduling an appt with PCP to get urine cultures. Pt is very concerned about his incision being infected. He is wanting to be seen and has been scheduled for tomorrow morning for a wound check. Pt verbalized understanding of this and will call us back should anything change/worsen.

## 2022-09-30 NOTE — Telephone Encounter (Addendum)
The patient will reach out to this office, having  UTI symptoms?. Plan: - UA, urine culture. Dx  UTI.  To be done today. - Needs hospital follow-up, scheduled for 10/06/2022. - If severe symptoms, fever or chills needs to be seen sooner

## 2022-10-01 ENCOUNTER — Telehealth: Payer: Self-pay

## 2022-10-01 ENCOUNTER — Ambulatory Visit: Payer: PPO | Admitting: Vascular Surgery

## 2022-10-01 DIAGNOSIS — F1120 Opioid dependence, uncomplicated: Secondary | ICD-10-CM | POA: Diagnosis not present

## 2022-10-01 LAB — URINALYSIS, ROUTINE W REFLEX MICROSCOPIC
Bilirubin Urine: NEGATIVE
Ketones, ur: NEGATIVE
Leukocytes,Ua: NEGATIVE
Nitrite: NEGATIVE
Specific Gravity, Urine: 1.02 (ref 1.000–1.030)
Total Protein, Urine: NEGATIVE
Urine Glucose: NEGATIVE
Urobilinogen, UA: 0.2 (ref 0.0–1.0)
pH: 6 (ref 5.0–8.0)

## 2022-10-01 LAB — URINE CULTURE
MICRO NUMBER:: 15067915
SPECIMEN QUALITY:: ADEQUATE

## 2022-10-01 NOTE — Telephone Encounter (Signed)
Returned pt's call regarding how long sutures take to dissolve and if he should lay in bed or walk. Pt has been advised on the above. He seems very nervous, as he has called several times the past 2 days was seen yesterday. Pt verbalized understanding; no further questions/concerns at this time.

## 2022-10-02 ENCOUNTER — Telehealth: Payer: Self-pay | Admitting: Internal Medicine

## 2022-10-02 NOTE — Telephone Encounter (Signed)
Spoke w/ Pt- informed I received call below, he is doing fine, states he has been getting so many calls since hospital discharge that he is missing some.

## 2022-10-02 NOTE — Telephone Encounter (Signed)
Informed Ricardo Fisher- I was able to get in touch with Pt- he is doing fine. She verbalized understanding.

## 2022-10-02 NOTE — Telephone Encounter (Signed)
Ricardo Fisher with [facility] called to report a missed visit. She said she is worried about him because the day before yesterday his caregiver said he was on his way back to the hospital and they had planned to meet yesterday. She reached out to them yesterday and they didn't answer or call back. If you need to call her back , her number is [phone]

## 2022-10-03 ENCOUNTER — Ambulatory Visit: Payer: PPO | Admitting: Internal Medicine

## 2022-10-05 DIAGNOSIS — F1120 Opioid dependence, uncomplicated: Secondary | ICD-10-CM | POA: Diagnosis not present

## 2022-10-06 ENCOUNTER — Ambulatory Visit (HOSPITAL_BASED_OUTPATIENT_CLINIC_OR_DEPARTMENT_OTHER)
Admission: RE | Admit: 2022-10-06 | Discharge: 2022-10-06 | Disposition: A | Discharge status: Home / Self Care | Payer: PPO | Source: Ambulatory Visit | Attending: Surgery | Admitting: Surgery

## 2022-10-06 ENCOUNTER — Encounter: Payer: Self-pay | Admitting: Internal Medicine

## 2022-10-06 ENCOUNTER — Telehealth: Payer: Self-pay

## 2022-10-06 ENCOUNTER — Ambulatory Visit (INDEPENDENT_AMBULATORY_CARE_PROVIDER_SITE_OTHER): Payer: PPO | Admitting: Internal Medicine

## 2022-10-06 VITALS — BP 108/70 | HR 86 | Temp 98.1°F | Resp 18 | Ht 69.0 in | Wt 144.1 lb

## 2022-10-06 DIAGNOSIS — E291 Testicular hypofunction: Secondary | ICD-10-CM

## 2022-10-06 DIAGNOSIS — F419 Anxiety disorder, unspecified: Secondary | ICD-10-CM

## 2022-10-06 DIAGNOSIS — K8020 Calculus of gallbladder without cholecystitis without obstruction: Secondary | ICD-10-CM | POA: Diagnosis not present

## 2022-10-06 DIAGNOSIS — I7143 Infrarenal abdominal aortic aneurysm, without rupture: Secondary | ICD-10-CM

## 2022-10-06 DIAGNOSIS — E875 Hyperkalemia: Secondary | ICD-10-CM | POA: Diagnosis not present

## 2022-10-06 MED ORDER — IOHEXOL 350 MG/ML SOLN
100.0000 mL | Freq: Once | INTRAVENOUS | Status: AC | PRN
  Administered 2022-10-06: 100 mL via INTRAVENOUS

## 2022-10-06 NOTE — Patient Instructions (Addendum)
    GO TO THE LAB : Get the blood work     GO TO THE FRONT DESK, PLEASE SCHEDULE YOUR APPOINTMENTS Come back for   checkup in 4 months   Vaccines I recommend: Covid booster Prevnar 20 (pneumococcal) Shingrix (shingles) #2 RSV vaccine  Please bring [place] a copy of your Healthcare Power of Attorney for your chart.
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
## 2022-10-06 NOTE — Telephone Encounter (Signed)
Pt called this week after being seen by APP last week. He is still c/o small amount of drainage from his incision and is taking his antibiotics, last day is today. He denies any fever, redness, odor. States the drainage is minimal and has not needed to keep anything over it. He sounds very anxious still, and stated that his b/p is low and he is losing weight and laying flat most of the day. I have advised him to place a dry gauze without tape over the area and to move around more. He is seeing PCP today to discuss low B/P and will tell his PCP about his weight loss. He attributes it to worrying and stress since surgery. Pt is f/u tomorrow with CT and to see MD next week. He will call us back if anything changes/worsens.

## 2022-10-07 LAB — CBC WITH DIFFERENTIAL/PLATELET
Basophils Absolute: 0.1 10*3/uL (ref 0.0–0.1)
Basophils Relative: 1.4 % (ref 0.0–3.0)
Eosinophils Absolute: 0.1 10*3/uL (ref 0.0–0.7)
Eosinophils Relative: 1 % (ref 0.0–5.0)
HCT: 32.3 % — ABNORMAL LOW (ref 39.0–52.0)
Hemoglobin: 10.5 g/dL — ABNORMAL LOW (ref 13.0–17.0)
Lymphocytes Relative: 14.4 % (ref 12.0–46.0)
Lymphs Abs: 1.4 10*3/uL (ref 0.7–4.0)
MCHC: 32.5 g/dL (ref 30.0–36.0)
MCV: 96.8 fl (ref 78.0–100.0)
Monocytes Absolute: 0.9 10*3/uL (ref 0.1–1.0)
Monocytes Relative: 9.3 % (ref 3.0–12.0)
Neutro Abs: 7.4 10*3/uL (ref 1.4–7.7)
Neutrophils Relative %: 73.9 % (ref 43.0–77.0)
Platelets: 553 10*3/uL — ABNORMAL HIGH (ref 150.0–400.0)
RBC: 3.34 Mil/uL — ABNORMAL LOW (ref 4.22–5.81)
RDW: 14.7 % (ref 11.5–15.5)
WBC: 10 10*3/uL (ref 4.0–10.5)

## 2022-10-07 LAB — COMPREHENSIVE METABOLIC PANEL
ALT: 68 U/L — ABNORMAL HIGH (ref 0–53)
AST: 74 U/L — ABNORMAL HIGH (ref 0–37)
Albumin: 3.8 g/dL (ref 3.5–5.2)
Alkaline Phosphatase: 238 U/L — ABNORMAL HIGH (ref 39–117)
BUN: 28 mg/dL — ABNORMAL HIGH (ref 6–23)
CO2: 27 mEq/L (ref 19–32)
Calcium: 9.3 mg/dL (ref 8.4–10.5)
Chloride: 99 mEq/L (ref 96–112)
Creatinine, Ser: 1.51 mg/dL — ABNORMAL HIGH (ref 0.40–1.50)
GFR: 47.67 mL/min — ABNORMAL LOW (ref >60.00)
Glucose, Bld: 96 mg/dL (ref 70–99)
Potassium: 5.7 mEq/L — ABNORMAL HIGH (ref 3.5–5.1)
Sodium: 134 mEq/L — ABNORMAL LOW (ref 135–145)
Total Bilirubin: 0.6 mg/dL (ref 0.2–1.2)
Total Protein: 7.4 g/dL (ref 6.0–8.3)

## 2022-10-07 LAB — TESTOSTERONE: Testosterone: 303.03 ng/dL (ref 300.00–890.00)

## 2022-10-07 NOTE — Assessment & Plan Note (Signed)
Status post aortic abdominal endovascular repair: Procedure was done few days ago, he has what seems to be a superficial infection, on Bactrim as prescribed by vascular surgeons.  See pictures and  graphic.  No fever or chills. He has a scheduled CT of the area for later on today. Will get CMP CBC. Tobacco: Since the procedure he stopped tobacco, praised. Methadone: Has been prescribed methadone elsewhere for years, states he won't cont methadone Hypogonadism: Check a testosterone level.  Reports good med compliance. Anxiety: Extremely anxious about potential complications of the endovascular repair.  Listening therapy provided. RTC 3 to 4 months

## 2022-10-07 NOTE — Addendum Note (Signed)
Addended byConrad Holstein D on: 10/07/2022 02:42 PM   Modules accepted: Orders

## 2022-10-10 ENCOUNTER — Other Ambulatory Visit (INDEPENDENT_AMBULATORY_CARE_PROVIDER_SITE_OTHER): Payer: PPO

## 2022-10-10 DIAGNOSIS — E875 Hyperkalemia: Secondary | ICD-10-CM | POA: Diagnosis not present

## 2022-10-10 LAB — BASIC METABOLIC PANEL
BUN: 23 mg/dL (ref 6–23)
CO2: 31 mEq/L (ref 19–32)
Calcium: 9.2 mg/dL (ref 8.4–10.5)
Chloride: 99 mEq/L (ref 96–112)
Creatinine, Ser: 1.11 mg/dL (ref 0.40–1.50)
GFR: 68.96 mL/min (ref >60.00)
Glucose, Bld: 85 mg/dL (ref 70–99)
Potassium: 4.9 mEq/L (ref 3.5–5.1)
Sodium: 136 mEq/L (ref 135–145)

## 2022-10-13 ENCOUNTER — Telehealth: Payer: Self-pay

## 2022-10-13 NOTE — Telephone Encounter (Signed)
Ricardo Aye, RN with [phone] [facility] called requesting verbal orders to move d/c to this week because they were unable to last week.  Reviewed pt's chart, returned call for clarification, two identifiers used. When asked for a reason for documentation that the d/c wasn't done last week, there was a long pause. She claimed that the pt didn't want to d/c. I confirmed that he would be d/c'd this week and she said they tried to go out to his home on Friday, but he wasn't seen. I asked why and she again had a excessively long pause. She stated that he wasn't seen that day per pt request. Verbal orders given. Confirmed understanding.

## 2022-10-15 ENCOUNTER — Encounter: Payer: Self-pay | Admitting: Vascular Surgery

## 2022-10-15 ENCOUNTER — Inpatient Hospital Stay: Payer: PPO | Admitting: Internal Medicine

## 2022-10-15 ENCOUNTER — Ambulatory Visit: Payer: PPO | Admitting: Vascular Surgery

## 2022-10-15 VITALS — BP 123/78 | HR 69 | Temp 98.2°F | Resp 20 | Ht 69.0 in | Wt 144.0 lb

## 2022-10-15 DIAGNOSIS — I7143 Infrarenal abdominal aortic aneurysm, without rupture: Secondary | ICD-10-CM

## 2022-10-15 NOTE — Progress Notes (Signed)
     Subjective:     Patient ID: Ricardo Fisher, male   DOB: 04-23-1955, 67 y.o.   MRN: [id number]  HPI 67 year old follows up after EVAR requiring bilateral common femoral cutdowns and vein patch angioplasty.  He is recovering well with continued swelling of his right medial thigh.  He is slowly returning to regular activity.   Review of Systems Weakness and fatigue, right groin swelling    Objective:   Physical Exam Vitals:   10/15/22 1428  BP: 123/78  Pulse: 69  Resp: 20  Temp: 98.2 F (36.8 C)  SpO2: 99%   Awake alert oriented Nonlabored respirations Left comfortable pulses easily palpable Right comfortable pulses weak there is some fibrinous exudate from the cephalad aspect of the incision Swelling of right medial thigh side of saphenous vein harvested soft no evidence of hematoma or erythema    CTA IMPRESSION: VASCULAR IMPRESSION:   1. Interval endovascular repair of infrarenal abdominal aortic aneurysm without evidence of complication. Specifically, no evidence of an endoleak. Aortic aneurysm NOS (ICD10-I71.9). 2. Suspected hemodynamically significant narrowings involving the bilateral external iliac arteries as well as the right common femoral artery as detailed above. Aortic Atherosclerosis (ICD10-I70.0). 3. Bilobed fluid collections within the groins bilaterally are without contrast enhancement and favored to represent postoperative seromas. Clinical correlation is advised.      Assessment:     67 year old male status post endovascular aneurysm repair with bilateral common femoral endarterectomy and vein patch angioplasty due to extensive underlying iliofemoral occlusive disease.  He is recovering well.    Plan:     Continue to work towards regular activity  Follow-up in 3 to 4 months before duplex and ABIs.  Tylin Stradley C. Randie Heinz, MD [facility] [place] Office: [phone] Pager: [phone]

## 2022-10-16 ENCOUNTER — Other Ambulatory Visit: Payer: Self-pay | Admitting: Internal Medicine

## 2022-10-16 ENCOUNTER — Other Ambulatory Visit (HOSPITAL_BASED_OUTPATIENT_CLINIC_OR_DEPARTMENT_OTHER): Payer: Self-pay

## 2022-10-16 MED ORDER — METOPROLOL SUCCINATE ER 25 MG PO TB24
12.5000 mg | ORAL_TABLET | Freq: Every day | ORAL | 1 refills | Status: DC
  Filled 2022-10-16: qty 45, 90d supply, fill #0

## 2022-10-20 ENCOUNTER — Other Ambulatory Visit (HOSPITAL_BASED_OUTPATIENT_CLINIC_OR_DEPARTMENT_OTHER): Payer: Self-pay

## 2022-10-27 ENCOUNTER — Telehealth: Payer: Self-pay

## 2022-10-27 NOTE — Telephone Encounter (Signed)
Caller: Patient  Concern: One spot that "won't go away, it keeps getting harder", concerned about upcoming cruise and wants to ensure that it is healing appropriately. The area is still painful and makes wearing some clothing difficult.  Location: right leg  Description:  since surgery  Procedure: Interventional Vascular Procedure and Thrombectomy  Resolution: Appointment scheduled for some Wednesday within the next 3 weeks, per pt request  Next Appt: Appointment scheduled for 11/12/22 @ 1420

## 2022-10-28 ENCOUNTER — Encounter: Payer: PPO | Admitting: Internal Medicine

## 2022-10-29 ENCOUNTER — Ambulatory Visit (INDEPENDENT_AMBULATORY_CARE_PROVIDER_SITE_OTHER): Payer: PPO | Admitting: Internal Medicine

## 2022-10-29 ENCOUNTER — Encounter: Payer: Self-pay | Admitting: Internal Medicine

## 2022-10-29 VITALS — BP 128/74 | HR 62 | Temp 98.3°F | Resp 20 | Ht 69.0 in | Wt 151.0 lb

## 2022-10-29 DIAGNOSIS — I10 Essential (primary) hypertension: Secondary | ICD-10-CM

## 2022-10-29 DIAGNOSIS — E291 Testicular hypofunction: Secondary | ICD-10-CM

## 2022-10-29 DIAGNOSIS — Z0001 Encounter for general adult medical examination with abnormal findings: Secondary | ICD-10-CM

## 2022-10-29 DIAGNOSIS — E782 Mixed hyperlipidemia: Secondary | ICD-10-CM

## 2022-10-29 DIAGNOSIS — R7989 Other specified abnormal findings of blood chemistry: Secondary | ICD-10-CM

## 2022-10-29 DIAGNOSIS — R933 Abnormal findings on diagnostic imaging of other parts of digestive tract: Secondary | ICD-10-CM | POA: Diagnosis not present

## 2022-10-29 DIAGNOSIS — Z0000 Encounter for general adult medical examination without abnormal findings: Secondary | ICD-10-CM | POA: Diagnosis not present
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
## 2022-10-29 NOTE — Patient Instructions (Addendum)
Recommend to take a full tablet of Zetia daily.  Vaccines I recommend: Shingrix (shingles) #2 Covid booster Flu shot this fall RSV vaccine Pneumonia shot (PNM 20)  Please bring or send [place] a copy of your Healthcare Power of Attorney for your chart.      GO TO THE LAB : Get the blood work     GO TO THE FRONT DESK, PLEASE SCHEDULE YOUR APPOINTMENTS Come back for a checkup in 4 to 6 months

## 2022-10-30 ENCOUNTER — Encounter: Payer: Self-pay | Admitting: Internal Medicine

## 2022-10-30 LAB — AST: AST: 21 U/L (ref 0–37)

## 2022-10-30 LAB — LIPID PANEL
Cholesterol: 129 mg/dL (ref 0–200)
HDL: 53.8 mg/dL (ref >39.00)
LDL Cholesterol: 59 mg/dL (ref 0–99)
NonHDL: 75.01
Total CHOL/HDL Ratio: 2
Triglycerides: 81 mg/dL (ref 0.0–149.0)
VLDL: 16.2 mg/dL (ref 0.0–40.0)

## 2022-10-30 LAB — PSA: PSA: 0.08 ng/mL — ABNORMAL LOW (ref 0.10–4.00)

## 2022-10-30 LAB — LIPASE: Lipase: 122 U/L — ABNORMAL HIGH (ref 11.0–59.0)

## 2022-10-30 LAB — ALT: ALT: 13 U/L (ref 0–53)

## 2022-10-30 LAB — AMYLASE: Amylase: 160 U/L — ABNORMAL HIGH (ref 27–131)

## 2022-10-30 NOTE — Assessment & Plan Note (Signed)
-  Td 03/2019 - pnm 23 01-2015;  prevnar 2018   -Vaccines I recommend: Shingrix No. 2, pneumonia shot (PNM 20), COVID booster, flu shot every fall, RSV. --CCS:  2006  Cscope Dr Luther Parody Normal , reports a  WNL Cscope (no records)   2009 - Dr. Dulce Sellar ; s/p  Cscope 08-30-20 Dr Leone Payor, next per GI --Prostate ca screening: Declines DRE, still sore from recent surgery.  On HRT.  Check PSA. - Lung cancer screening: Offered CT, declines,  "too many things going on" - Lifestyle: Recovering from surgery have not been very active but he is typically active.  - Labs:AST ALT amylase lipase FLP PSA -Healthcare POA: Advised to bring a copy.

## 2022-10-30 NOTE — Assessment & Plan Note (Signed)
Here for CPX AAA repair: Saw vascular surgery 10/15/2022, felt to be recovering well HTN: BP today is very good, continue metoprolol. Hyperlipidemia: On Lipitor and taking half Zetia, rec to take a a whole zetia qd, labs. Increased LFTs: LFTs noted to be elevated lately, recheck today. Abnormal MRCP: Check amylase, lipase, will discuss with GI. Hypogonadism: Normal testosterone levels recently. Tobacco abuse: Not smoking at present RTC 4 to 6 months depending on results

## 2022-10-31 ENCOUNTER — Other Ambulatory Visit: Payer: Self-pay

## 2022-10-31 DIAGNOSIS — I7143 Infrarenal abdominal aortic aneurysm, without rupture: Secondary | ICD-10-CM

## 2022-11-03 ENCOUNTER — Other Ambulatory Visit (HOSPITAL_BASED_OUTPATIENT_CLINIC_OR_DEPARTMENT_OTHER): Payer: Self-pay

## 2022-11-05 ENCOUNTER — Other Ambulatory Visit (HOSPITAL_BASED_OUTPATIENT_CLINIC_OR_DEPARTMENT_OTHER): Payer: Self-pay

## 2022-11-05 ENCOUNTER — Ambulatory Visit: Payer: PPO | Admitting: Internal Medicine

## 2022-11-05 DIAGNOSIS — F429 Obsessive-compulsive disorder, unspecified: Secondary | ICD-10-CM | POA: Diagnosis not present

## 2022-11-05 DIAGNOSIS — F4011 Social phobia, generalized: Secondary | ICD-10-CM | POA: Diagnosis not present

## 2022-11-05 DIAGNOSIS — Z5181 Encounter for therapeutic drug level monitoring: Secondary | ICD-10-CM | POA: Diagnosis not present

## 2022-11-07 ENCOUNTER — Other Ambulatory Visit: Payer: Self-pay

## 2022-11-07 ENCOUNTER — Other Ambulatory Visit (HOSPITAL_BASED_OUTPATIENT_CLINIC_OR_DEPARTMENT_OTHER): Payer: Self-pay

## 2022-11-10 ENCOUNTER — Other Ambulatory Visit (HOSPITAL_BASED_OUTPATIENT_CLINIC_OR_DEPARTMENT_OTHER): Payer: Self-pay

## 2022-11-12 ENCOUNTER — Ambulatory Visit: Payer: PPO | Admitting: Vascular Surgery

## 2022-11-12 ENCOUNTER — Encounter: Payer: Self-pay | Admitting: Vascular Surgery

## 2022-11-12 VITALS — BP 138/80 | HR 75 | Temp 98.6°F | Resp 20 | Ht 69.0 in | Wt 155.0 lb

## 2022-11-12 DIAGNOSIS — I97648 Postprocedural seroma of a circulatory system organ or structure following other circulatory system procedure: Secondary | ICD-10-CM

## 2022-11-12 DIAGNOSIS — I7143 Infrarenal abdominal aortic aneurysm, without rupture: Secondary | ICD-10-CM

## 2022-11-12 NOTE — Progress Notes (Signed)
     Subjective:     Patient ID: Ricardo Fisher, male   DOB: 09/16/1955, 67 y.o.   MRN: [id number]  HPI 24 post endovascular repair complicated by need for bilateral common femoral cutdown and vein patch angioplasty.  He has persistent swelling in the right groin which is causing him problems with wearing pants and the seatbelt.  He is otherwise healed well.  He has been without cigarettes and surgery and has been walking for exercise.   Review of Systems Right groin swelling    Objective:   Physical Exam Vitals:   11/12/22 1407  BP: 138/80  Pulse: 75  Resp: 20  Temp: 98.6 F (37 C)  SpO2: 97%   Awake alert oriented Nonlabored respiration Persistent seroma present right groin without surrounding erythema     Assessment/plan     67 year old male with persistent seroma in the right groin after cutdown for repair of common femoral artery after EVAR.  Seroma appears improved from our previous evaluation by my exam today.  I recommended continued compression and nonoperative intervention.  We discussed the options being drainage either with needle aspiration or surgical.    At this time we will continue nonoperative management but if the seroma persist 4 weeks from now he can call to be scheduled for operative drainage of right groin seroma without further eval ration.  He will otherwise keep his regularly scheduled follow-up in October.        Earnstine Meinders C. Randie Heinz, MD [facility] Office: [phone] Pager: [phone]

## 2022-11-14 ENCOUNTER — Telehealth: Payer: Self-pay | Admitting: Internal Medicine

## 2022-11-14 NOTE — Telephone Encounter (Signed)
Copied from CRM [id number]. Topic: Medicare AWV >> Nov 14, 2022 11:12 AM Payton Doughty wrote: Reason for CRM: LM 11/14/2022 to schedule AWV   Verlee Rossetti; Care Guide Ambulatory Clinical Support [facility] Direct Dial: [phone]

## 2022-11-17 ENCOUNTER — Other Ambulatory Visit (HOSPITAL_BASED_OUTPATIENT_CLINIC_OR_DEPARTMENT_OTHER): Payer: Self-pay

## 2022-11-20 ENCOUNTER — Other Ambulatory Visit (HOSPITAL_BASED_OUTPATIENT_CLINIC_OR_DEPARTMENT_OTHER): Payer: Self-pay

## 2022-12-09 ENCOUNTER — Ambulatory Visit: Payer: PPO | Admitting: Cardiology

## 2022-12-19 ENCOUNTER — Telehealth: Payer: Self-pay

## 2022-12-19 ENCOUNTER — Other Ambulatory Visit: Payer: Self-pay

## 2022-12-19 DIAGNOSIS — I97648 Postprocedural seroma of a circulatory system organ or structure following other circulatory system procedure: Secondary | ICD-10-CM

## 2022-12-19 NOTE — Telephone Encounter (Signed)
Pt called stating that the groin area is no better.  Reviewed pt's chart, returned call for clarification, no answer, lf vm. According to last OV, Dr. Randie Heinz would proceed with procedure if pt's symptoms hadn't improved. Printed note and gave to surgery scheduling.

## 2022-12-19 NOTE — Telephone Encounter (Signed)
Patient called in to discuss upcoming follow up appointment in October with Dr. Leone Payor. He's not able to do a CT scan right now d/t his schedule. He would prefer to hold off until he can come in for OV. He also wanted to go over his upcoming doctor visits. Patient was able to write down all upcoming visits & provided number to his vascular doctor so that he could reach out to them for further information if needed.

## 2022-12-19 NOTE — Telephone Encounter (Signed)
Contacted patient regarding triage call received. He reports a continued "bulge of fluid at right groin" that is becoming uncomfortable, to the point of discomfort wearing undergarments. Offered to schedule patient for drainage of right groin seroma on 9/3. Patient declined, stating he had some work he needed to complete and requested 9/10, if available. Scheduled patient for 9/10 as requested. Instructions provided and he voiced understanding. Will update Dr. Randie Heinz of patient symptoms and procedure scheduling.

## 2022-12-24 ENCOUNTER — Other Ambulatory Visit: Payer: Self-pay | Admitting: Internal Medicine

## 2022-12-24 ENCOUNTER — Other Ambulatory Visit (HOSPITAL_BASED_OUTPATIENT_CLINIC_OR_DEPARTMENT_OTHER): Payer: Self-pay

## 2022-12-24 MED ORDER — METOPROLOL SUCCINATE ER 25 MG PO TB24
12.5000 mg | ORAL_TABLET | Freq: Every day | ORAL | 1 refills | Status: DC
  Filled 2022-12-24: qty 45, 90d supply, fill #0
  Filled 2023-03-03: qty 45, 90d supply, fill #1

## 2022-12-24 NOTE — Telephone Encounter (Signed)
Spoke w/ Pt- informed of PCP recommendations, he states that HR is sometimes below 55 already, he is scheduled to see PCP on 10/18, we decided to continue 1/2 tablet daily until then. Rx refill sent.

## 2022-12-24 NOTE — Telephone Encounter (Signed)
Chart is reviewed, no formal recommendation was written. I am not opposed to increase metoprolol XL 25 mg from half tablet daily to 1 tablet daily.  Please call patient, recommend to check ambulatory BPs and heart rate frequently. If heart rate is going below 55 or he has side effects he is to let us know.

## 2022-12-24 NOTE — Telephone Encounter (Signed)
Refill request for clindamycin lotion. Please advise?

## 2022-12-24 NOTE — Telephone Encounter (Signed)
  Pharmacy comment: Patient states the provider has increased him to 1 tablet daily      Received message above from pharmacy - reviewed last 2 OV notes, I do not see where you or Dr. Randie Heinz increased metoprolol dose. Please advise.

## 2022-12-25 ENCOUNTER — Other Ambulatory Visit (HOSPITAL_BASED_OUTPATIENT_CLINIC_OR_DEPARTMENT_OTHER): Payer: Self-pay

## 2022-12-25 MED ORDER — CLINDAMYCIN PHOSPHATE 1 % EX LOTN
TOPICAL_LOTION | Freq: Every day | CUTANEOUS | 5 refills | Status: DC | PRN
  Filled 2022-12-25: qty 60, 30d supply, fill #0

## 2022-12-25 NOTE — Telephone Encounter (Signed)
History of rosacea, okay to RF x 6 months

## 2022-12-29 ENCOUNTER — Other Ambulatory Visit (HOSPITAL_BASED_OUTPATIENT_CLINIC_OR_DEPARTMENT_OTHER): Payer: Self-pay

## 2022-12-29 ENCOUNTER — Other Ambulatory Visit: Payer: Self-pay

## 2022-12-29 ENCOUNTER — Telehealth: Payer: Self-pay | Admitting: Internal Medicine

## 2022-12-29 ENCOUNTER — Encounter (HOSPITAL_COMMUNITY): Payer: Self-pay | Admitting: Vascular Surgery

## 2022-12-29 MED ORDER — TESTOSTERONE 1.62 % TD GEL
3.0000 | Freq: Every day | TRANSDERMAL | 2 refills | Status: DC
  Filled 2022-12-29: qty 150, 40d supply, fill #0
  Filled 2023-02-05: qty 150, 40d supply, fill #1
  Filled 2023-03-27: qty 150, 40d supply, fill #2

## 2022-12-29 NOTE — Telephone Encounter (Signed)
Requesting: testosterone  Contract: n/a  UDS: n/a  Last Visit: 10/06/22 Next Visit: 02/06/23 Last Refill: 06/24/22 #150g and 2RF    Please Advise

## 2022-12-29 NOTE — Progress Notes (Addendum)
SDW call  Patient was given pre-op instructions over the phone. Patient verbalized understanding of instructions provided.     PCP - Dr. Willow Ora Cardiologist - Dr. Bryan Lemma Pulmonary:    PPM/ICD - denies Device Orders - n/a Rep Notified - n/a   Chest x-ray - 09/23/2022 EKG -  08/05/2022 Stress Test - ECHO -  Cardiac Cath - 2005  Sleep Study/sleep apnea/CPAP: denies  Non-diabetic   Blood Thinner Instructions: denies Aspirin Instructions:states only takes for minor pain   ERAS Protcol - NPO   COVID TEST- n/a    Anesthesia review: Yes. HTN, AAA, heart cath, high cholesterol   Patient denies shortness of breath, fever, cough and chest pain over the phone call  Your procedure is scheduled on Tuesday December 30, 2022  Report to [facility] Main Entrance "A" at  1045  A.M., then check in with the Admitting office.  Call this number if you have problems the morning of surgery:  [phone]   If you have any questions prior to your surgery date call [phone]: Open Monday-Friday 8am-4pm If you experience any cold or flu symptoms such as cough, fever, chills, shortness of breath, etc. between now and your scheduled surgery, please notify us at the above number     Remember:  Do not eat or drink after midnight the night before your surgery  Take these medicines the morning of surgery with A SIP OF WATER:  metoprolol  As needed: xanax  As of today, STOP taking any Aspirin (unless otherwise instructed by your surgeon) Aleve, Naproxen, Ibuprofen, Motrin, Advil, Goody's, BC's, all herbal medications, fish oil, and all vitamins.

## 2022-12-29 NOTE — Telephone Encounter (Signed)
Prescription sent

## 2022-12-30 ENCOUNTER — Ambulatory Visit (HOSPITAL_COMMUNITY)
Admission: RE | Admit: 2022-12-30 | Discharge: 2022-12-30 | Disposition: A | Discharge status: Home / Self Care | Payer: PPO | Attending: Vascular Surgery | Admitting: Vascular Surgery

## 2022-12-30 ENCOUNTER — Other Ambulatory Visit (HOSPITAL_COMMUNITY): Payer: Self-pay

## 2022-12-30 ENCOUNTER — Other Ambulatory Visit: Payer: Self-pay

## 2022-12-30 ENCOUNTER — Encounter (HOSPITAL_COMMUNITY)
Admission: RE | Disposition: A | Discharge status: Home / Self Care | Payer: Self-pay | Source: Home / Self Care | Attending: Vascular Surgery

## 2022-12-30 ENCOUNTER — Ambulatory Visit (HOSPITAL_BASED_OUTPATIENT_CLINIC_OR_DEPARTMENT_OTHER): Payer: Self-pay | Admitting: Physician Assistant

## 2022-12-30 ENCOUNTER — Ambulatory Visit (HOSPITAL_COMMUNITY): Payer: PPO | Admitting: Physician Assistant

## 2022-12-30 DIAGNOSIS — Z79899 Other long term (current) drug therapy: Secondary | ICD-10-CM | POA: Insufficient documentation

## 2022-12-30 DIAGNOSIS — S301XXA Contusion of abdominal wall, initial encounter: Secondary | ICD-10-CM | POA: Diagnosis not present

## 2022-12-30 DIAGNOSIS — E785 Hyperlipidemia, unspecified: Secondary | ICD-10-CM | POA: Diagnosis not present

## 2022-12-30 DIAGNOSIS — I10 Essential (primary) hypertension: Secondary | ICD-10-CM

## 2022-12-30 DIAGNOSIS — Z87891 Personal history of nicotine dependence: Secondary | ICD-10-CM | POA: Insufficient documentation

## 2022-12-30 DIAGNOSIS — I97648 Postprocedural seroma of a circulatory system organ or structure following other circulatory system procedure: Secondary | ICD-10-CM

## 2022-12-30 DIAGNOSIS — L7634 Postprocedural seroma of skin and subcutaneous tissue following other procedure: Secondary | ICD-10-CM | POA: Insufficient documentation

## 2022-12-30 HISTORY — PX: INCISION AND DRAINAGE: SHX5863

## 2022-12-30 LAB — POCT I-STAT, CHEM 8
BUN: 22 mg/dL (ref 8–23)
Calcium, Ion: 1.25 mmol/L (ref 1.15–1.40)
Chloride: 98 mmol/L (ref 98–111)
Creatinine, Ser: 1.2 mg/dL (ref 0.61–1.24)
Glucose, Bld: 99 mg/dL (ref 70–99)
HCT: 46 % (ref 39.0–52.0)
Hemoglobin: 15.6 g/dL (ref 13.0–17.0)
Potassium: 4.6 mmol/L (ref 3.5–5.1)
Sodium: 137 mmol/L (ref 135–145)
TCO2: 30 mmol/L (ref 22–32)

## 2022-12-30 SURGERY — INCISION AND DRAINAGE
Anesthesia: General | Site: Groin | Laterality: Right

## 2022-12-30 MED ORDER — HYDROMORPHONE HCL 1 MG/ML IJ SOLN: 0.2500 mg | INTRAMUSCULAR | Status: DC | PRN

## 2022-12-30 MED ORDER — CHLORHEXIDINE GLUCONATE 4 % EX SOLN: 60.0000 mL | Freq: Once | CUTANEOUS | Status: DC

## 2022-12-30 MED ORDER — FENTANYL CITRATE (PF) 250 MCG/5ML IJ SOLN
INTRAMUSCULAR | Status: DC | PRN
  Administered 2022-12-30 (×3): 50 ug via INTRAVENOUS

## 2022-12-30 MED ORDER — MIDAZOLAM HCL 2 MG/2ML IJ SOLN
INTRAMUSCULAR | Status: AC
  Filled 2022-12-30: qty 2

## 2022-12-30 MED ORDER — FENTANYL CITRATE (PF) 250 MCG/5ML IJ SOLN
INTRAMUSCULAR | Status: AC
  Filled 2022-12-30: qty 5

## 2022-12-30 MED ORDER — PHENYLEPHRINE 80 MCG/ML (10ML) SYRINGE FOR IV PUSH (FOR BLOOD PRESSURE SUPPORT)
PREFILLED_SYRINGE | INTRAVENOUS | Status: DC | PRN
  Administered 2022-12-30 (×2): 80 ug via INTRAVENOUS

## 2022-12-30 MED ORDER — DEXAMETHASONE SODIUM PHOSPHATE 10 MG/ML IJ SOLN
INTRAMUSCULAR | Status: AC
  Filled 2022-12-30: qty 1

## 2022-12-30 MED ORDER — OXYCODONE-ACETAMINOPHEN 5-325 MG PO TABS
1.0000 | ORAL_TABLET | Freq: Four times a day (QID) | ORAL | 0 refills | Status: DC | PRN
Start: 2022-12-30 — End: 2023-02-06
  Filled 2022-12-30: qty 30, 8d supply, fill #0

## 2022-12-30 MED ORDER — ACETAMINOPHEN 500 MG PO TABS
1000.0000 mg | ORAL_TABLET | Freq: Once | ORAL | Status: AC
  Administered 2022-12-30: 1000 mg via ORAL
  Filled 2022-12-30: qty 2

## 2022-12-30 MED ORDER — ONDANSETRON HCL 4 MG/2ML IJ SOLN
INTRAMUSCULAR | Status: AC
  Filled 2022-12-30: qty 2

## 2022-12-30 MED ORDER — EPHEDRINE 5 MG/ML INJ
INTRAVENOUS | Status: AC
  Filled 2022-12-30: qty 5

## 2022-12-30 MED ORDER — SODIUM CHLORIDE 0.9 % IV SOLN: INTRAVENOUS | Status: DC

## 2022-12-30 MED ORDER — 0.9 % SODIUM CHLORIDE (POUR BTL) OPTIME
TOPICAL | Status: DC | PRN
  Administered 2022-12-30: 2000 mL

## 2022-12-30 MED ORDER — LIDOCAINE 2% (20 MG/ML) 5 ML SYRINGE
INTRAMUSCULAR | Status: DC | PRN
  Administered 2022-12-30: 100 mg via INTRAVENOUS

## 2022-12-30 MED ORDER — HEPARIN 6000 UNIT IRRIGATION SOLUTION
Status: DC | PRN
  Administered 2022-12-30: 1

## 2022-12-30 MED ORDER — PROPOFOL 10 MG/ML IV BOLUS
INTRAVENOUS | Status: AC
  Filled 2022-12-30: qty 20

## 2022-12-30 MED ORDER — ORAL CARE MOUTH RINSE: 15.0000 mL | Freq: Once | OROMUCOSAL | Status: AC

## 2022-12-30 MED ORDER — EPHEDRINE SULFATE-NACL 50-0.9 MG/10ML-% IV SOSY
PREFILLED_SYRINGE | INTRAVENOUS | Status: DC | PRN
  Administered 2022-12-30 (×3): 5 mg via INTRAVENOUS

## 2022-12-30 MED ORDER — ONDANSETRON HCL 4 MG/2ML IJ SOLN
INTRAMUSCULAR | Status: DC | PRN
  Administered 2022-12-30: 4 mg via INTRAVENOUS

## 2022-12-30 MED ORDER — CHLORHEXIDINE GLUCONATE 0.12 % MT SOLN
15.0000 mL | Freq: Once | OROMUCOSAL | Status: AC
  Administered 2022-12-30: 15 mL via OROMUCOSAL
  Filled 2022-12-30: qty 15

## 2022-12-30 MED ORDER — LACTATED RINGERS IV SOLN: INTRAVENOUS | Status: DC

## 2022-12-30 MED ORDER — CEFAZOLIN SODIUM-DEXTROSE 2-4 GM/100ML-% IV SOLN
2.0000 g | INTRAVENOUS | Status: AC
  Administered 2022-12-30: 2 g via INTRAVENOUS
  Filled 2022-12-30: qty 100

## 2022-12-30 MED ORDER — HEPARIN 6000 UNIT IRRIGATION SOLUTION
Status: AC
  Filled 2022-12-30: qty 500

## 2022-12-30 MED ORDER — DEXAMETHASONE SODIUM PHOSPHATE 10 MG/ML IJ SOLN
INTRAMUSCULAR | Status: DC | PRN
  Administered 2022-12-30: 5 mg via INTRAVENOUS

## 2022-12-30 MED ORDER — MIDAZOLAM HCL 2 MG/2ML IJ SOLN
INTRAMUSCULAR | Status: DC | PRN
  Administered 2022-12-30: 2 mg via INTRAVENOUS

## 2022-12-30 MED ORDER — PROPOFOL 10 MG/ML IV BOLUS
INTRAVENOUS | Status: DC | PRN
Start: 2022-12-30 — End: 2022-12-30
  Administered 2022-12-30: 180 mg via INTRAVENOUS

## 2022-12-30 SURGICAL SUPPLY — 25 items
ADH SKN CLS APL DERMABOND .7 (GAUZE/BANDAGES/DRESSINGS) ×1
CANISTER SUCT 3000ML PPV (MISCELLANEOUS) ×1 IMPLANT
CLIP LIGATING EXTRA MED SLVR (CLIP) ×1 IMPLANT
CLIP LIGATING EXTRA SM BLUE (MISCELLANEOUS) ×1 IMPLANT
DERMABOND ADVANCED .7 DNX12 (GAUZE/BANDAGES/DRESSINGS) ×1 IMPLANT
GLOVE BIO SURGEON STRL SZ7.5 (GLOVE) ×1 IMPLANT
GOWN STRL REUS W/ TWL LRG LVL3 (GOWN DISPOSABLE) ×3 IMPLANT
GOWN STRL REUS W/ TWL XL LVL3 (GOWN DISPOSABLE) ×2 IMPLANT
GOWN STRL REUS W/TWL LRG LVL3 (GOWN DISPOSABLE) ×3
GOWN STRL REUS W/TWL XL LVL3 (GOWN DISPOSABLE) ×2
KIT BASIN OR (CUSTOM PROCEDURE TRAY) ×1 IMPLANT
KIT TURNOVER KIT B (KITS) ×1 IMPLANT
NS IRRIG 1000ML POUR BTL (IV SOLUTION) ×1 IMPLANT
PACK PERIPHERAL VASCULAR (CUSTOM PROCEDURE TRAY) ×1 IMPLANT
PAD ARMBOARD 7.5X6 YLW CONV (MISCELLANEOUS) ×1 IMPLANT
POWDER SURGICEL 3.0 GRAM (HEMOSTASIS) IMPLANT
SUT MNCRL AB 4-0 PS2 18 (SUTURE) ×1 IMPLANT
SUT PROLENE 5 0 C 1 24 (SUTURE) ×1 IMPLANT
SUT PROLENE 6 0 BV (SUTURE) ×1 IMPLANT
SUT VIC AB 2-0 CT1 27 (SUTURE) ×1
SUT VIC AB 2-0 CT1 TAPERPNT 27 (SUTURE) ×1 IMPLANT
SUT VIC AB 3-0 SH 27 (SUTURE) ×1
SUT VIC AB 3-0 SH 27X BRD (SUTURE) ×1 IMPLANT
TOWEL GREEN STERILE (TOWEL DISPOSABLE) ×1 IMPLANT
WATER STERILE IRR 1000ML POUR (IV SOLUTION) ×1 IMPLANT

## 2022-12-30 NOTE — Op Note (Signed)
    Patient name: Ricardo Fisher MRN: [id number] DOB: 07-12-1955 Sex: male  12/30/2022 Pre-operative Diagnosis: Right groin seroma Post-operative diagnosis:  Same Surgeon:  Apolinar Junes C. Randie Heinz, MD Assistant: Clinton Gallant, PA Procedure Performed:  Excision of capsule and drainage of right groin seroma  Indications: 67 year old male status post endovascular aneurysm repair requiring bilateral common femoral exposure and vein patch angioplasty.  He has had persistent right groin fluid collection which has been quite bothersome and he is now indicated for drainage.  In experience assistant was necessary to facilitate exposure of the capsule and removed the capsule in its entirety  Findings: There was a 3 cm seroma capsule that was removed entirely.  There is no identifiable lymphatic leak and the wound was closed primarily.   Procedure:  The patient was identified in the holding area and taken to the operating room where he was placed supine operative table and general anesthesia induced.  He was sterilely prepped and draped in the right groin in the usual fashion, antibiotics were administered timeout was called.  We began by reopening the previous incision.  I could palpate a capsule and dissected down onto this and this was opened and significant seroma was removed.  We then dissected out the entirety of the capsule and this was removed which was 3 x 3 cm.  There was no identifiable leaking serous fluid or lymphatic fluid.  I did cauterize some of the remaining tissue in hopes of creating scar tissue.  We also achieved hemostasis in the wound and thoroughly irrigated and closed in layers with Vicryl and Monocryl at the skin layer.  Dermabond was placed at the skin level.  He was awakened from anesthesia having tolerated the procedure without any complication.  All counts were correct at completion.  EBL: 10 cc   Deetta Siegmann C. Randie Heinz, MD [facility] [place] Office:  [phone] Pager: [phone]

## 2022-12-30 NOTE — Anesthesia Procedure Notes (Signed)
Procedure Name: LMA Insertion Date/Time: 12/30/2022 7:45 AM  Performed by: Sandie Ano, CRNAPre-anesthesia Checklist: Patient identified, Emergency Drugs available, Suction available and Patient being monitored Patient Re-evaluated:Patient Re-evaluated prior to induction Oxygen Delivery Method: Circle System Utilized Preoxygenation: Pre-oxygenation with 100% oxygen Induction Type: IV induction Ventilation: Mask ventilation without difficulty LMA: LMA inserted LMA Size: 4.0 Number of attempts: 1 Airway Equipment and Method: Bite block Placement Confirmation: positive ETCO2 Tube secured with: Tape Dental Injury: Teeth and Oropharynx as per pre-operative assessment
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
## 2022-12-30 NOTE — Anesthesia Postprocedure Evaluation (Signed)
Anesthesia Post Note  Patient: Ricardo Fisher  Procedure(s) Performed: INCISION AND DRAINAGE OF RIGHT GROIN SEROMA (Right: Groin) REDO GROIN EXPOSURE GREATER THAN 30 DAYS (Right: Groin)     Patient location during evaluation: PACU Anesthesia Type: General Level of consciousness: awake and alert Pain management: pain level controlled Vital Signs Assessment: post-procedure vital signs reviewed and stable Respiratory status: spontaneous breathing, nonlabored ventilation and respiratory function stable Cardiovascular status: blood pressure returned to baseline and stable Postop Assessment: no apparent nausea or vomiting Anesthetic complications: no  No notable events documented.  Last Vitals:  Vitals:   12/30/22 0900 12/30/22 0915  BP: (!) 152/71 (!) 145/85  Pulse: 71 64  Resp: 18 14  Temp:  (!) 36.3 C  SpO2: 97% 98%    Last Pain:  Vitals:   12/30/22 0915  TempSrc:   PainSc: 0-No pain                 Tashina Credit,W. EDMOND

## 2022-12-30 NOTE — Transfer of Care (Signed)
Immediate Anesthesia Transfer of Care Note  Patient: Ricardo Fisher  Procedure(s) Performed: INCISION AND DRAINAGE OF RIGHT GROIN SEROMA (Right: Groin) REDO GROIN EXPOSURE GREATER THAN 30 DAYS (Right: Groin)  Patient Location: PACU  Anesthesia Type:General  Level of Consciousness: awake, alert , oriented, drowsy, and patient cooperative  Airway & Oxygen Therapy: Patient Spontanous Breathing and Patient connected to nasal cannula oxygen  Post-op Assessment: Report given to RN, Post -op Vital signs reviewed and stable, and Patient moving all extremities  Post vital signs: Reviewed and stable  Last Vitals:  Vitals Value Taken Time  BP    Temp    Pulse    Resp    SpO2      Last Pain:  Vitals:   12/30/22 0707  TempSrc:   PainSc: 0-No pain         Complications: No notable events documented.

## 2022-12-30 NOTE — Discharge Instructions (Signed)
You may shower in 24 hours daily as needed

## 2022-12-30 NOTE — Anesthesia Preprocedure Evaluation (Addendum)
Anesthesia Evaluation  Patient identified by MRN, date of birth, ID band Patient awake    Reviewed: Allergy & Precautions, H&P , NPO status , Patient's Chart, lab work & pertinent test results, reviewed documented beta blocker date and time   Airway Mallampati: I  TM Distance: >3 FB Neck ROM: Full    Dental no notable dental hx. (+) Teeth Intact, Dental Advisory Given   Pulmonary Patient abstained from smoking., former smoker   Pulmonary exam normal breath sounds clear to auscultation       Cardiovascular hypertension, Pt. on medications and Pt. on home beta blockers + Peripheral Vascular Disease   Rhythm:Regular Rate:Normal     Neuro/Psych   Anxiety     negative neurological ROS     GI/Hepatic Neg liver ROS,GERD  ,,  Endo/Other  negative endocrine ROS    Renal/GU negative Renal ROS  negative genitourinary   Musculoskeletal  (+) Arthritis , Osteoarthritis,    Abdominal   Peds  Hematology negative hematology ROS (+)   Anesthesia Other Findings   Reproductive/Obstetrics negative OB ROS                              Anesthesia Physical Anesthesia Plan  ASA: 3  Anesthesia Plan: General   Post-op Pain Management: Tylenol PO (pre-op)*   Induction: Intravenous  PONV Risk Score and Plan: 3 and Ondansetron, Dexamethasone and Midazolam  Airway Management Planned: LMA  Additional Equipment:   Intra-op Plan:   Post-operative Plan: Extubation in OR  Informed Consent: I have reviewed the patients History and Physical, chart, labs and discussed the procedure including the risks, benefits and alternatives for the proposed anesthesia with the patient or authorized representative who has indicated his/her understanding and acceptance.     Dental advisory given  Plan Discussed with: CRNA  Anesthesia Plan Comments:         Anesthesia Quick Evaluation

## 2022-12-31 ENCOUNTER — Encounter (HOSPITAL_COMMUNITY): Payer: Self-pay | Admitting: Vascular Surgery

## 2023-01-21 ENCOUNTER — Encounter: Payer: Self-pay | Admitting: Vascular Surgery

## 2023-01-21 ENCOUNTER — Ambulatory Visit (INDEPENDENT_AMBULATORY_CARE_PROVIDER_SITE_OTHER): Payer: PPO | Admitting: Vascular Surgery

## 2023-01-21 VITALS — BP 137/82 | HR 62 | Temp 98.0°F | Resp 20 | Ht 69.0 in | Wt 162.0 lb

## 2023-01-21 DIAGNOSIS — I97648 Postprocedural seroma of a circulatory system organ or structure following other circulatory system procedure: Secondary | ICD-10-CM

## 2023-01-21 DIAGNOSIS — I7143 Infrarenal abdominal aortic aneurysm, without rupture: Secondary | ICD-10-CM

## 2023-01-21 NOTE — Progress Notes (Signed)
Subjective:     Patient ID: Ricardo Fisher, male   DOB: [birth year], 67 y.o.   MRN: [id number]  HPI 67 year old male status post excision of right femoral seroma capsule after EVAR.  He follows up for wound check he has minimal swelling in the right groin much improved from previous surgery.   Review of Systems Right groin swelling    Objective:   Physical Exam Vitals:   01/21/23 1352  BP: 137/82  Pulse: 62  Resp: 20  Temp: 98 F (36.7 C)  SpO2: 98%   Awake alert oriented Nonlabored respirations Right groin incision well-healed with minimal underlying swelling Left groin well-healed    Assessment/plan     67 year old male status post excision of capsule and drainage of right groin seroma status post EVAR.  Plan will be to follow-up with aortoiliac duplex in a few weeks as previously scheduled.  Wounds are thankfully healing well.  Ricardo Fisher C. Ricardo Heinz, MD [facility] [place] Office: [phone] Pager: [phone]

## 2023-01-27 ENCOUNTER — Ambulatory Visit: Payer: PPO | Admitting: Internal Medicine

## 2023-01-29 ENCOUNTER — Other Ambulatory Visit (HOSPITAL_BASED_OUTPATIENT_CLINIC_OR_DEPARTMENT_OTHER): Payer: Self-pay

## 2023-01-29 ENCOUNTER — Encounter: Payer: Self-pay | Admitting: Pharmacist

## 2023-01-29 ENCOUNTER — Other Ambulatory Visit: Payer: Self-pay | Admitting: Pharmacist

## 2023-01-29 MED ORDER — ATORVASTATIN CALCIUM 20 MG PO TABS
20.0000 mg | ORAL_TABLET | Freq: Every day | ORAL | 3 refills | Status: DC
  Filled 2023-01-29 – 2023-01-30 (×2): qty 90, 90d supply, fill #0
  Filled 2023-07-28: qty 90, 90d supply, fill #1
  Filled 2023-12-07: qty 90, 90d supply, fill #2

## 2023-01-29 NOTE — Progress Notes (Signed)
Pharmacy Quality Measure Review  This patient is appearing on a report for being at risk of failing the adherence measure for cholesterol (statin) medications this calendar year.   Medication: atorvastatin 20mg   Last fill date: 12/26/2022 for 30 day supply  Contacted patient to see if he would like to get 90 DS prescription going forward since all his other medications are for 90 DS. Patient would prefer 90 DS.   Reviewed medication indication, dosing, and goals of therapy.   Patient has several other questions.  He wanted to know best time to use clindamycin lotion. Recommended he use after his evening shower. 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 He prefers not to use sunscreen because it irritates his eyes when he sweats. Recommended he try using a facial lotion with SPF 15 or higher each morning (like oil of olay)   Ricardo Fisher is going on a 12 day family cruise on 03/01/2023. He asks if he should get any vaccines before the cruise. Recommended he get flu and COVID vaccines. He has appointment with Dr Drue Novel 10/18 and will get flu vaccine at that visit and COVID vaccine at [facility] pharmacy.    Henrene Pastor, PharmD Clinical Pharmacist [facility]  Population Health [phone]

## 2023-01-30 ENCOUNTER — Other Ambulatory Visit (HOSPITAL_BASED_OUTPATIENT_CLINIC_OR_DEPARTMENT_OTHER): Payer: Self-pay

## 2023-01-30 ENCOUNTER — Other Ambulatory Visit: Payer: Self-pay

## 2023-02-03 ENCOUNTER — Other Ambulatory Visit (HOSPITAL_BASED_OUTPATIENT_CLINIC_OR_DEPARTMENT_OTHER): Payer: Self-pay

## 2023-02-05 ENCOUNTER — Other Ambulatory Visit: Payer: Self-pay

## 2023-02-05 ENCOUNTER — Other Ambulatory Visit (HOSPITAL_BASED_OUTPATIENT_CLINIC_OR_DEPARTMENT_OTHER): Payer: Self-pay

## 2023-02-06 ENCOUNTER — Encounter: Payer: Self-pay | Admitting: Internal Medicine

## 2023-02-06 ENCOUNTER — Other Ambulatory Visit (HOSPITAL_BASED_OUTPATIENT_CLINIC_OR_DEPARTMENT_OTHER): Payer: Self-pay

## 2023-02-06 ENCOUNTER — Ambulatory Visit: Payer: PPO | Admitting: Internal Medicine

## 2023-02-06 VITALS — BP 126/76 | HR 78 | Temp 97.7°F | Resp 16 | Ht 69.0 in | Wt 158.5 lb

## 2023-02-06 DIAGNOSIS — Z23 Encounter for immunization: Secondary | ICD-10-CM | POA: Diagnosis not present

## 2023-02-06 DIAGNOSIS — E291 Testicular hypofunction: Secondary | ICD-10-CM

## 2023-02-06 LAB — TESTOSTERONE: Testosterone: 369.52 ng/dL (ref 300.00–890.00)

## 2023-02-06 MED ORDER — COVID-19 MRNA VAC-TRIS(PFIZER) 30 MCG/0.3ML IM SUSY
0.3000 mL | PREFILLED_SYRINGE | Freq: Once | INTRAMUSCULAR | 0 refills | Status: AC
Start: 2023-02-06 — End: 2023-02-07
  Filled 2023-02-06: qty 0.3, 1d supply, fill #0

## 2023-02-06 NOTE — Patient Instructions (Addendum)
You got the flu shot today. Other vaccines are recommended: Covid booster.  You can get it today if you like to. Second dose of Shingrix at your pharmacy. A pneumonia shot called PNM 20 at your pharmacy. RSV vaccine      GO TO THE LAB : Get the blood work     Next visit with me in 5 to 6 months    Please schedule it at the front desk
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
## 2023-02-07 NOTE — Assessment & Plan Note (Signed)
HTN: BP is very good, continue metoprolol. Hyperlipidemia: Last FLP satisfactory, on Lipitor, he is electing to take 1/2 Zetia qd.  Will continue with that. Hypogonadism: Good compliance with HRT, checking a testosterone level Increased LFTs: Last LFTs normal Abnormal MRCP, last lipase and amylase slightly elevated, discussed with GI >> expect a chronic intermittent amylase/lipase elevation , follow-up issue clinically.  Patient is currently asymptomatic Aspirin: Due to easy bruising he is taking aspirin less than daily, no blood in the urine or in the stools, benefit of aspirin as discussed, recommend to take 1 daily. Tobacco: Quit tobacco 09/2022.  Praised Vaccine advice: Flu shot today, recommend COVID booster, second Shingrix, PNM 20, RSV RTC approximately 5 months

## 2023-02-11 ENCOUNTER — Other Ambulatory Visit (HOSPITAL_COMMUNITY): Payer: PPO

## 2023-02-11 ENCOUNTER — Encounter (HOSPITAL_COMMUNITY): Payer: PPO

## 2023-02-11 ENCOUNTER — Ambulatory Visit: Payer: PPO | Admitting: Vascular Surgery

## 2023-02-16 ENCOUNTER — Other Ambulatory Visit (HOSPITAL_BASED_OUTPATIENT_CLINIC_OR_DEPARTMENT_OTHER): Payer: Self-pay

## 2023-03-03 ENCOUNTER — Other Ambulatory Visit (HOSPITAL_BASED_OUTPATIENT_CLINIC_OR_DEPARTMENT_OTHER): Payer: Self-pay

## 2023-03-04 ENCOUNTER — Other Ambulatory Visit: Payer: Self-pay

## 2023-03-27 ENCOUNTER — Other Ambulatory Visit (HOSPITAL_BASED_OUTPATIENT_CLINIC_OR_DEPARTMENT_OTHER): Payer: Self-pay

## 2023-03-30 ENCOUNTER — Other Ambulatory Visit (HOSPITAL_BASED_OUTPATIENT_CLINIC_OR_DEPARTMENT_OTHER): Payer: Self-pay

## 2023-04-29 DIAGNOSIS — F4011 Social phobia, generalized: Secondary | ICD-10-CM | POA: Diagnosis not present

## 2023-05-11 ENCOUNTER — Other Ambulatory Visit (HOSPITAL_BASED_OUTPATIENT_CLINIC_OR_DEPARTMENT_OTHER): Payer: Self-pay

## 2023-05-26 ENCOUNTER — Telehealth: Payer: Self-pay

## 2023-05-26 NOTE — Telephone Encounter (Signed)
 Appt scheduled 05/27/23

## 2023-05-26 NOTE — Telephone Encounter (Signed)
Please have Pt schedule an appt within the next several days. Thank you.

## 2023-05-26 NOTE — Telephone Encounter (Signed)
Copied from CRM [id number]. Topic: General - Other >> May 26, 2023  9:03 AM Truddie Crumble wrote: Reason for CRM: patient would like to be called concerning his blood pressure bouncing around to 140 and 150 on the top number

## 2023-05-27 ENCOUNTER — Encounter: Payer: Self-pay | Admitting: Internal Medicine

## 2023-05-27 ENCOUNTER — Telehealth: Payer: Self-pay | Admitting: Internal Medicine

## 2023-05-27 ENCOUNTER — Other Ambulatory Visit (HOSPITAL_BASED_OUTPATIENT_CLINIC_OR_DEPARTMENT_OTHER): Payer: Self-pay

## 2023-05-27 ENCOUNTER — Ambulatory Visit (INDEPENDENT_AMBULATORY_CARE_PROVIDER_SITE_OTHER): Payer: PPO | Admitting: Internal Medicine

## 2023-05-27 ENCOUNTER — Other Ambulatory Visit: Payer: Self-pay

## 2023-05-27 VITALS — BP 126/74 | HR 66 | Ht 69.0 in | Wt 154.8 lb

## 2023-05-27 DIAGNOSIS — I10 Essential (primary) hypertension: Secondary | ICD-10-CM | POA: Diagnosis not present

## 2023-05-27 DIAGNOSIS — E291 Testicular hypofunction: Secondary | ICD-10-CM

## 2023-05-27 DIAGNOSIS — R5383 Other fatigue: Secondary | ICD-10-CM

## 2023-05-27 DIAGNOSIS — R002 Palpitations: Secondary | ICD-10-CM

## 2023-05-27 MED ORDER — TESTOSTERONE 1.62 % TD GEL
3.0000 | Freq: Every day | TRANSDERMAL | 3 refills | Status: DC
  Filled 2023-05-27: qty 150, 40d supply, fill #0
  Filled 2023-07-14: qty 150, 40d supply, fill #1
  Filled 2023-09-04: qty 150, 40d supply, fill #2
  Filled 2023-10-21: qty 150, 40d supply, fill #3

## 2023-05-27 MED ORDER — EZETIMIBE 10 MG PO TABS
10.0000 mg | ORAL_TABLET | Freq: Every day | ORAL | 3 refills | Status: DC
  Filled 2023-05-27 (×2): qty 90, 90d supply, fill #0
  Filled 2023-09-04: qty 90, 90d supply, fill #1
  Filled 2023-12-07: qty 90, 90d supply, fill #2
  Filled 2024-03-08: qty 90, 90d supply, fill #3

## 2023-05-27 MED ORDER — EZETIMIBE 10 MG PO TABS
5.0000 mg | ORAL_TABLET | Freq: Every day | ORAL | 1 refills | Status: DC
  Filled 2023-05-27: qty 45, 90d supply, fill #0
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
## 2023-05-27 NOTE — Patient Instructions (Addendum)
 Continue same medications including Zetia  10 mg daily.   Check the  blood pressure regularly, daily or every other day. Blood pressure goal:  between 110/65 and  135/85. Check only at home, after resting for 10 minutes and drinking a glass of water. If your blood pressure then is slightly high, recheck in 10 minutes.  If it is consistently higher or lower, let me know  See your cardiologists April 2. If your palpitations get slightly worse let me know. If you have severe palpitation, chest pain or difficulty breathing: Go to the ER immediately.   GO TO THE LAB : Get the blood work     Next visit with me 3 months.    Please schedule it at the front desk

## 2023-05-27 NOTE — Telephone Encounter (Signed)
 Requesting: testosterone   Contract: n/a UDS: n/a  Last Visit: 02/06/23 Next Visit: 05/27/23 (later today) Last Refill: 12/29/22 #150g and 0RF   Please Advise

## 2023-05-27 NOTE — Telephone Encounter (Signed)
 PDMP okay, Rx sent

## 2023-05-28 ENCOUNTER — Telehealth: Payer: Self-pay | Admitting: Internal Medicine

## 2023-05-28 DIAGNOSIS — Z122 Encounter for screening for malignant neoplasm of respiratory organs: Secondary | ICD-10-CM

## 2023-05-28 DIAGNOSIS — E871 Hypo-osmolality and hyponatremia: Secondary | ICD-10-CM

## 2023-05-28 LAB — CBC WITH DIFFERENTIAL/PLATELET
Basophils Absolute: 0.1 10*3/uL (ref 0.0–0.1)
Basophils Relative: 0.8 % (ref 0.0–3.0)
Eosinophils Absolute: 0.1 10*3/uL (ref 0.0–0.7)
Eosinophils Relative: 0.6 % (ref 0.0–5.0)
HCT: 40.8 % (ref 39.0–52.0)
Hemoglobin: 14 g/dL (ref 13.0–17.0)
Lymphocytes Relative: 15.1 % (ref 12.0–46.0)
Lymphs Abs: 1.7 10*3/uL (ref 0.7–4.0)
MCHC: 34.4 g/dL (ref 30.0–36.0)
MCV: 97.4 fL (ref 78.0–100.0)
Monocytes Absolute: 0.8 10*3/uL (ref 0.1–1.0)
Monocytes Relative: 7.6 % (ref 3.0–12.0)
Neutro Abs: 8.5 10*3/uL — ABNORMAL HIGH (ref 1.4–7.7)
Neutrophils Relative %: 75.9 % (ref 43.0–77.0)
Platelets: 230 10*3/uL (ref 150.0–400.0)
RBC: 4.19 Mil/uL — ABNORMAL LOW (ref 4.22–5.81)
RDW: 12.1 % (ref 11.5–15.5)
WBC: 11.2 10*3/uL — ABNORMAL HIGH (ref 4.0–10.5)

## 2023-05-28 LAB — BASIC METABOLIC PANEL
BUN: 18 mg/dL (ref 6–23)
CO2: 29 meq/L (ref 19–32)
Calcium: 9.3 mg/dL (ref 8.4–10.5)
Chloride: 92 meq/L — ABNORMAL LOW (ref 96–112)
Creatinine, Ser: 0.93 mg/dL (ref 0.40–1.50)
GFR: 84.9 mL/min (ref >60.00)
Glucose, Bld: 79 mg/dL (ref 70–99)
Potassium: 5.1 meq/L (ref 3.5–5.1)
Sodium: 129 meq/L — ABNORMAL LOW (ref 135–145)

## 2023-05-28 LAB — TESTOSTERONE: Testosterone: 290.71 ng/dL — ABNORMAL LOW (ref 300.00–890.00)

## 2023-05-28 NOTE — Assessment & Plan Note (Signed)
 HTN: Getting some elevated readings at the [facility], BP today is very good. Plan: Check BMP.  Continue metoprolol .  Monitor BPs at home  after resting.  Goals provided. Hyperlipidemia: On Lipitor, at the last visit he said he elected to take only half Zetia , he thought that was giving him side effects (see next) and at this point he stopped taking it (somebody told him to stop).  Plan: Restart Zetia , Rx sent. Feeling unwell: Symptoms are vague, fatigue, muscle weakness when he does heavy lifting.  These are no new concerns.  See visits from 03/17/2022, 05/19/2022, had similar symptoms before.  In the past a TSH and a pathology smear, iron and liver tests were okay. Of note is, he felt better today after he took a tablet of potassium, on chart review he never had hypokalemia rather potassium has been elevated before.  Recommend not to take potassium regularly  Hypogonadism: Reports good compliance with testosterone , check levels.   Palpitations: Did report palpitations with exertion, no syncope, no chest pain. EKG today: Sinus rhythm, unchanged from previous. Plan: He will see cardiology 07-22-2023, if symptoms increase recommend to let me know and we will get a sooner cardiology appointment.  If symptoms severe: ER RTC 3 to 4 months

## 2023-05-28 NOTE — Telephone Encounter (Signed)
 Please call patient. Sodium is low, that may account for some fatigue. -Needs the following labs at his convenience: BMP, serum osmolality, Urinary sodium. Dx hyponatremia  - Testosterone  is slightly low, increase testosterone  supplement to 4 pumps every day

## 2023-05-29 NOTE — Telephone Encounter (Signed)
 Last read by Abundio Hoit" at  8:08 AM on 05/29/2023.

## 2023-05-29 NOTE — Telephone Encounter (Signed)
 Tried calling Pt- no answer.   Will send via mychart. If Pt calls back okay for e2c2 to discuss.

## 2023-06-01 ENCOUNTER — Other Ambulatory Visit (INDEPENDENT_AMBULATORY_CARE_PROVIDER_SITE_OTHER): Payer: PPO

## 2023-06-01 DIAGNOSIS — E871 Hypo-osmolality and hyponatremia: Secondary | ICD-10-CM

## 2023-06-02 ENCOUNTER — Telehealth: Payer: Self-pay

## 2023-06-02 LAB — BASIC METABOLIC PANEL
BUN: 13 mg/dL (ref 6–23)
CO2: 32 meq/L (ref 19–32)
Calcium: 9.4 mg/dL (ref 8.4–10.5)
Chloride: 94 meq/L — ABNORMAL LOW (ref 96–112)
Creatinine, Ser: 1.06 mg/dL (ref 0.40–1.50)
GFR: 72.56 mL/min (ref >60.00)
Glucose, Bld: 81 mg/dL (ref 70–99)
Potassium: 4.6 meq/L (ref 3.5–5.1)
Sodium: 133 meq/L — ABNORMAL LOW (ref 135–145)

## 2023-06-02 LAB — SODIUM, URINE, RANDOM: Sodium, Ur: 38 mmol/L (ref 28–272)

## 2023-06-02 LAB — OSMOLALITY: Osmolality: 274 mosm/kg — ABNORMAL LOW (ref 278–305)

## 2023-06-02 NOTE — Telephone Encounter (Signed)
Advice:  -pt called requesting a permission/clearance for a dentist appt he has coming up. -on chart review, pt is on ASA 81 mg daily -pt states he is having his teeth cleaned and his dentist wanted something in writing saying there was no problem.  Advised pt that if there is a form the dentist wants [place] to sign for procedural clearance, then have them fax it over.  Fax number provided.

## 2023-06-05 NOTE — Addendum Note (Signed)
Addended byConrad Lisbon D on: 06/05/2023 10:26 AM   Modules accepted: Orders

## 2023-06-08 ENCOUNTER — Other Ambulatory Visit: Payer: Self-pay | Admitting: Internal Medicine

## 2023-06-08 ENCOUNTER — Other Ambulatory Visit (HOSPITAL_COMMUNITY): Payer: Self-pay

## 2023-06-08 ENCOUNTER — Other Ambulatory Visit (HOSPITAL_BASED_OUTPATIENT_CLINIC_OR_DEPARTMENT_OTHER): Payer: Self-pay

## 2023-06-08 ENCOUNTER — Telehealth: Payer: Self-pay

## 2023-06-08 MED ORDER — METOPROLOL SUCCINATE ER 25 MG PO TB24
12.5000 mg | ORAL_TABLET | Freq: Every day | ORAL | 1 refills | Status: DC
  Filled 2023-06-08: qty 45, 90d supply, fill #0
  Filled 2023-07-28: qty 45, 90d supply, fill #1

## 2023-06-08 NOTE — Telephone Encounter (Signed)
Pt called to follow up on his dentist clearance form. He mentioned that he canceled his follow up studies/appt back in October because he had too much going on with other appts and work. I offered to r/s those appts now and he does not wish to do so at this time. He wants to wait until April. I advised him that these were supposed to be done after surgery and he verbalized understanding of that but said it would be best to wait until April to r/s. I offered to schedule them for April now on our call and he wants to call us to schedule when he is ready.

## 2023-06-15 ENCOUNTER — Telehealth: Payer: Self-pay

## 2023-06-15 NOTE — Telephone Encounter (Signed)
 Returned pt's call to let him know his forms have been faxed to [facility] last week. Pt is still not wanting to make his f/u appts at this time but states he will call soon to make them and has had a lot of other appts. He has been advised that we need to still schedule these and he is not ready to schedule them now.

## 2023-07-07 ENCOUNTER — Ambulatory Visit: Payer: PPO | Admitting: Internal Medicine

## 2023-07-14 ENCOUNTER — Other Ambulatory Visit (HOSPITAL_BASED_OUTPATIENT_CLINIC_OR_DEPARTMENT_OTHER): Payer: Self-pay

## 2023-07-15 ENCOUNTER — Other Ambulatory Visit (HOSPITAL_BASED_OUTPATIENT_CLINIC_OR_DEPARTMENT_OTHER): Payer: Self-pay

## 2023-07-20 ENCOUNTER — Encounter: Payer: Self-pay | Admitting: Emergency Medicine

## 2023-07-22 ENCOUNTER — Ambulatory Visit: Payer: PPO | Admitting: Cardiology

## 2023-07-28 ENCOUNTER — Other Ambulatory Visit (HOSPITAL_BASED_OUTPATIENT_CLINIC_OR_DEPARTMENT_OTHER): Payer: Self-pay

## 2023-09-04 ENCOUNTER — Other Ambulatory Visit (HOSPITAL_BASED_OUTPATIENT_CLINIC_OR_DEPARTMENT_OTHER): Payer: Self-pay

## 2023-09-07 ENCOUNTER — Other Ambulatory Visit (HOSPITAL_BASED_OUTPATIENT_CLINIC_OR_DEPARTMENT_OTHER): Payer: Self-pay

## 2023-09-08 ENCOUNTER — Encounter: Payer: Self-pay | Admitting: Internal Medicine

## 2023-09-08 ENCOUNTER — Other Ambulatory Visit (HOSPITAL_BASED_OUTPATIENT_CLINIC_OR_DEPARTMENT_OTHER): Payer: Self-pay

## 2023-09-08 ENCOUNTER — Ambulatory Visit: Payer: PPO | Admitting: Internal Medicine

## 2023-09-08 ENCOUNTER — Ambulatory Visit (INDEPENDENT_AMBULATORY_CARE_PROVIDER_SITE_OTHER): Payer: PPO | Admitting: Internal Medicine

## 2023-09-08 VITALS — BP 136/82 | HR 61 | Temp 98.0°F | Resp 16 | Ht 69.0 in | Wt 149.5 lb

## 2023-09-08 DIAGNOSIS — E871 Hypo-osmolality and hyponatremia: Secondary | ICD-10-CM

## 2023-09-08 DIAGNOSIS — E291 Testicular hypofunction: Secondary | ICD-10-CM | POA: Diagnosis not present

## 2023-09-08 DIAGNOSIS — I10 Essential (primary) hypertension: Secondary | ICD-10-CM | POA: Diagnosis not present

## 2023-09-08 DIAGNOSIS — E2740 Unspecified adrenocortical insufficiency: Secondary | ICD-10-CM

## 2023-09-08 DIAGNOSIS — R5383 Other fatigue: Secondary | ICD-10-CM | POA: Diagnosis not present

## 2023-09-08 NOTE — Assessment & Plan Note (Signed)
 Feeling unwell: See last visit, symptoms are now dissipated, feeling better. Hyponatremia: Found at the last visit, sodium was 129>>  repeated Na+ was 133, osmolality 224 and urinary sodium 38. DDx based on results: SIADH, adrenal insufficiency, thyroid  disease, renal failure. At the time I recommended LDCT to rule out pulmonary disease/malignancy.  That was not   pursued. Plan: Check TSH, recheck sodium, serum cortisol, further advised for results.  LDCT? Adrenal insufficiency?  Given weight loss, fatigue on and off, we are checking a cortisol level.  Further advised with results. HTN: On metoprolol .  BP looks good.  Check CMP and CBC Hypogonadism:Last testosterone  low at 290, rec to increase supplement to 4 pumps, reports compliance has not been the best, sometimes he only takes 3 pumps "depending on how I feel".  Checking levels. RTC for labs early in the morning RTC 3 months for checkup
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
## 2023-09-08 NOTE — Patient Instructions (Addendum)
 Go to the front desk. Arrange an appointment for blood work to be done early in the morning at around 8 AM, fasting.  Arrange for office visit with me in 3 months.  Continue the same medications.

## 2023-09-11 ENCOUNTER — Other Ambulatory Visit (INDEPENDENT_AMBULATORY_CARE_PROVIDER_SITE_OTHER)

## 2023-09-11 DIAGNOSIS — E2740 Unspecified adrenocortical insufficiency: Secondary | ICD-10-CM

## 2023-09-11 DIAGNOSIS — I10 Essential (primary) hypertension: Secondary | ICD-10-CM | POA: Diagnosis not present

## 2023-09-11 DIAGNOSIS — E871 Hypo-osmolality and hyponatremia: Secondary | ICD-10-CM | POA: Diagnosis not present

## 2023-09-11 DIAGNOSIS — E291 Testicular hypofunction: Secondary | ICD-10-CM

## 2023-09-11 LAB — COMPREHENSIVE METABOLIC PANEL WITH GFR
ALT: 16 U/L (ref 0–53)
AST: 25 U/L (ref 0–37)
Albumin: 4.3 g/dL (ref 3.5–5.2)
Alkaline Phosphatase: 50 U/L (ref 39–117)
BUN: 20 mg/dL (ref 6–23)
CO2: 32 meq/L (ref 19–32)
Calcium: 8.9 mg/dL (ref 8.4–10.5)
Chloride: 97 meq/L (ref 96–112)
Creatinine, Ser: 1.01 mg/dL (ref 0.40–1.50)
GFR: 76.74 mL/min (ref >60.00)
Glucose, Bld: 95 mg/dL (ref 70–99)
Potassium: 4.4 meq/L (ref 3.5–5.1)
Sodium: 133 meq/L — ABNORMAL LOW (ref 135–145)
Total Bilirubin: 0.5 mg/dL (ref 0.2–1.2)
Total Protein: 7 g/dL (ref 6.0–8.3)

## 2023-09-11 LAB — CBC WITH DIFFERENTIAL/PLATELET
Basophils Absolute: 0.1 10*3/uL (ref 0.0–0.1)
Basophils Relative: 0.8 % (ref 0.0–3.0)
Eosinophils Absolute: 0.1 10*3/uL (ref 0.0–0.7)
Eosinophils Relative: 1.5 % (ref 0.0–5.0)
HCT: 43.1 % (ref 39.0–52.0)
Hemoglobin: 14.5 g/dL (ref 13.0–17.0)
Lymphocytes Relative: 16.1 % (ref 12.0–46.0)
Lymphs Abs: 1.1 10*3/uL (ref 0.7–4.0)
MCHC: 33.6 g/dL (ref 30.0–36.0)
MCV: 97.8 fl (ref 78.0–100.0)
Monocytes Absolute: 0.6 10*3/uL (ref 0.1–1.0)
Monocytes Relative: 8.7 % (ref 3.0–12.0)
Neutro Abs: 4.9 10*3/uL (ref 1.4–7.7)
Neutrophils Relative %: 72.9 % (ref 43.0–77.0)
Platelets: 258 10*3/uL (ref 150.0–400.0)
RBC: 4.41 Mil/uL (ref 4.22–5.81)
RDW: 12.6 % (ref 11.5–15.5)
WBC: 6.8 10*3/uL (ref 4.0–10.5)

## 2023-09-11 LAB — TESTOSTERONE: Testosterone: 401.13 ng/dL (ref 300.00–890.00)

## 2023-09-11 LAB — TSH: TSH: 2.37 u[IU]/mL (ref 0.35–5.50)

## 2023-09-12 LAB — SODIUM, URINE, RANDOM: Sodium, Ur: 38 mmol/L (ref 28–272)

## 2023-09-16 ENCOUNTER — Ambulatory Visit: Payer: Self-pay | Admitting: Internal Medicine

## 2023-09-21 ENCOUNTER — Other Ambulatory Visit: Payer: Self-pay | Admitting: Internal Medicine

## 2023-09-21 ENCOUNTER — Other Ambulatory Visit (HOSPITAL_BASED_OUTPATIENT_CLINIC_OR_DEPARTMENT_OTHER): Payer: Self-pay

## 2023-09-21 LAB — CORTISOL, FREE: Cortisol Free, Ser: 1.19 ug/dL — ABNORMAL HIGH

## 2023-09-21 LAB — OSMOLALITY: Osmolality: 286 mosm/kg (ref 278–305)

## 2023-09-21 MED ORDER — METOPROLOL SUCCINATE ER 25 MG PO TB24
12.5000 mg | ORAL_TABLET | Freq: Every day | ORAL | 1 refills | Status: DC
Start: 2023-09-21 — End: 2023-11-05
  Filled 2023-09-21: qty 45, 90d supply, fill #0
  Filled 2023-11-05 (×2): qty 45, 90d supply, fill #1

## 2023-09-23 ENCOUNTER — Telehealth: Payer: Self-pay | Admitting: Internal Medicine

## 2023-09-23 DIAGNOSIS — M25519 Pain in unspecified shoulder: Secondary | ICD-10-CM

## 2023-09-23 NOTE — Telephone Encounter (Signed)
 Copied from CRM [id number]. Topic: Referral - Request for Referral >> Sep 23, 2023  2:38 PM Caliyah H wrote: Did the patient discuss referral with their provider in the last year? No (If No - schedule appointment) (If Yes - send message)  Appointment offered? 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 Patient stated he saw Dr. Clover Dao and the provider noticed that it was something wrong with his rotator cuff.   Preference of office, provider, location: [facility] with Dr. May Sparks   If referral order, have you been seen by this specialty before? Yes (If Yes, this issue or another issue? When? Where?  Can we respond through MyChart? No

## 2023-09-23 NOTE — Addendum Note (Signed)
 Addended by: Sadye Kiernan D on: 09/23/2023 02:56 PM   Modules accepted: Orders

## 2023-09-23 NOTE — Telephone Encounter (Signed)
Referral placed to [facility].  

## 2023-09-28 ENCOUNTER — Other Ambulatory Visit (HOSPITAL_BASED_OUTPATIENT_CLINIC_OR_DEPARTMENT_OTHER): Payer: Self-pay

## 2023-09-28 DIAGNOSIS — M25512 Pain in left shoulder: Secondary | ICD-10-CM | POA: Diagnosis not present

## 2023-09-28 DIAGNOSIS — G8929 Other chronic pain: Secondary | ICD-10-CM | POA: Diagnosis not present

## 2023-09-28 MED ORDER — MELOXICAM 7.5 MG PO TABS
7.5000 mg | ORAL_TABLET | Freq: Two times a day (BID) | ORAL | 0 refills | Status: AC
  Filled 2023-09-28: qty 28, 14d supply, fill #0

## 2023-09-30 ENCOUNTER — Other Ambulatory Visit (HOSPITAL_BASED_OUTPATIENT_CLINIC_OR_DEPARTMENT_OTHER): Payer: Self-pay

## 2023-10-01 ENCOUNTER — Telehealth: Payer: Self-pay | Admitting: Internal Medicine

## 2023-10-01 NOTE — Telephone Encounter (Signed)
 Copied from CRM [id number]. Topic: Medicare AWV >> Oct 01, 2023 10:15 AM Juliana Ocean wrote: Reason for CRM: LVM 10/01/2023 to schedule AWV. Please schedule Virtual or Telehealth visits ONLY.   Rosalee Collins; Care Guide Ambulatory Clinical Support [place] [facility] Direct Dial: [phone]

## 2023-10-21 ENCOUNTER — Other Ambulatory Visit (HOSPITAL_BASED_OUTPATIENT_CLINIC_OR_DEPARTMENT_OTHER): Payer: Self-pay

## 2023-10-21 ENCOUNTER — Other Ambulatory Visit: Payer: Self-pay

## 2023-11-04 DIAGNOSIS — F4011 Social phobia, generalized: Secondary | ICD-10-CM | POA: Diagnosis not present

## 2023-11-05 ENCOUNTER — Other Ambulatory Visit: Payer: Self-pay

## 2023-11-05 ENCOUNTER — Other Ambulatory Visit: Payer: Self-pay | Admitting: Internal Medicine

## 2023-11-05 ENCOUNTER — Other Ambulatory Visit (HOSPITAL_BASED_OUTPATIENT_CLINIC_OR_DEPARTMENT_OTHER): Payer: Self-pay

## 2023-11-05 ENCOUNTER — Other Ambulatory Visit (HOSPITAL_COMMUNITY): Payer: Self-pay

## 2023-11-05 MED ORDER — METOPROLOL SUCCINATE ER 25 MG PO TB24
25.0000 mg | ORAL_TABLET | Freq: Every day | ORAL | 1 refills | Status: DC
  Filled 2023-11-05 (×2): qty 90, 90d supply, fill #0
  Filled 2023-11-05: qty 30, 30d supply, fill #0
  Filled 2023-12-07 (×2): qty 30, 30d supply, fill #1
  Filled 2024-01-04: qty 30, 30d supply, fill #2
  Filled 2024-02-03: qty 30, 30d supply, fill #3
  Filled 2024-03-08: qty 30, 30d supply, fill #4
  Filled 2024-03-31: qty 30, 30d supply, fill #5

## 2023-11-05 NOTE — Telephone Encounter (Signed)
 Please advise- received refill request from pharmacy stating Pt is taking 1 full tablet of metoprolol  daily, we have 1/2 tab on med list. Please advise

## 2023-11-05 NOTE — Telephone Encounter (Signed)
 Send pt  message to the patient. Will refill 1 tablet as requested.   Recommend to monitor her BPs (no less than 130/60) and heart rate (no less than 55).

## 2023-11-05 NOTE — Telephone Encounter (Signed)
 Mychart message sent.

## 2023-11-18 ENCOUNTER — Other Ambulatory Visit (HOSPITAL_COMMUNITY): Payer: Self-pay

## 2023-11-26 ENCOUNTER — Other Ambulatory Visit (HOSPITAL_BASED_OUTPATIENT_CLINIC_OR_DEPARTMENT_OTHER): Payer: Self-pay

## 2023-11-26 ENCOUNTER — Other Ambulatory Visit: Payer: Self-pay | Admitting: Internal Medicine

## 2023-11-26 NOTE — Telephone Encounter (Signed)
 Requesting: testosterone   Contract: n/a UDS: n/a Last Visit: 09/08/23 Next Visit: 12/11/23 Last Refill: 05/27/23 #150g and 0RF   Please Advise

## 2023-11-27 ENCOUNTER — Other Ambulatory Visit (HOSPITAL_BASED_OUTPATIENT_CLINIC_OR_DEPARTMENT_OTHER): Payer: Self-pay

## 2023-11-27 MED ORDER — TESTOSTERONE 1.62 % TD GEL
3.0000 | Freq: Every day | TRANSDERMAL | 3 refills | Status: AC
  Filled 2023-11-27: qty 150, 40d supply, fill #0
  Filled 2024-01-21: qty 150, 40d supply, fill #1
  Filled 2024-03-08: qty 150, 40d supply, fill #2
  Filled 2024-05-03: qty 150, 40d supply, fill #3

## 2023-11-27 NOTE — Telephone Encounter (Signed)
 PDMP okay, Rx sent

## 2023-12-07 ENCOUNTER — Other Ambulatory Visit (HOSPITAL_BASED_OUTPATIENT_CLINIC_OR_DEPARTMENT_OTHER): Payer: Self-pay

## 2023-12-09 ENCOUNTER — Other Ambulatory Visit: Payer: Self-pay

## 2023-12-09 ENCOUNTER — Other Ambulatory Visit (HOSPITAL_BASED_OUTPATIENT_CLINIC_OR_DEPARTMENT_OTHER): Payer: Self-pay

## 2023-12-11 ENCOUNTER — Encounter: Payer: Self-pay | Admitting: Internal Medicine

## 2023-12-11 ENCOUNTER — Ambulatory Visit (INDEPENDENT_AMBULATORY_CARE_PROVIDER_SITE_OTHER): Admitting: Internal Medicine

## 2023-12-11 VITALS — BP 126/84 | HR 56 | Temp 97.9°F | Resp 16 | Ht 69.0 in | Wt 151.0 lb

## 2023-12-11 DIAGNOSIS — I10 Essential (primary) hypertension: Secondary | ICD-10-CM

## 2023-12-11 DIAGNOSIS — E291 Testicular hypofunction: Secondary | ICD-10-CM

## 2023-12-11 DIAGNOSIS — Z7185 Encounter for immunization safety counseling: Secondary | ICD-10-CM

## 2023-12-11 DIAGNOSIS — I7143 Infrarenal abdominal aortic aneurysm, without rupture: Secondary | ICD-10-CM

## 2023-12-11 DIAGNOSIS — Z95828 Presence of other vascular implants and grafts: Secondary | ICD-10-CM

## 2023-12-11 DIAGNOSIS — E782 Mixed hyperlipidemia: Secondary | ICD-10-CM

## 2023-12-11 DIAGNOSIS — F419 Anxiety disorder, unspecified: Secondary | ICD-10-CM

## 2023-12-11 DIAGNOSIS — B182 Chronic viral hepatitis C: Secondary | ICD-10-CM

## 2023-12-11 DIAGNOSIS — E871 Hypo-osmolality and hyponatremia: Secondary | ICD-10-CM | POA: Diagnosis not present

## 2023-12-11 DIAGNOSIS — R7989 Other specified abnormal findings of blood chemistry: Secondary | ICD-10-CM | POA: Diagnosis not present

## 2023-12-11 LAB — LIPID PANEL
Cholesterol: 128 mg/dL (ref 0–200)
HDL: 61.1 mg/dL (ref >39.00)
LDL Cholesterol: 49 mg/dL (ref 0–99)
NonHDL: 66.84
Total CHOL/HDL Ratio: 2
Triglycerides: 87 mg/dL (ref 0.0–149.0)
VLDL: 17.4 mg/dL (ref 0.0–40.0)

## 2023-12-11 LAB — PSA: PSA: 0.12 ng/mL (ref 0.10–4.00)

## 2023-12-11 LAB — BASIC METABOLIC PANEL WITH GFR
BUN: 13 mg/dL (ref 6–23)
CO2: 30 meq/L (ref 19–32)
Calcium: 9.2 mg/dL (ref 8.4–10.5)
Chloride: 93 meq/L — ABNORMAL LOW (ref 96–112)
Creatinine, Ser: 0.91 mg/dL (ref 0.40–1.50)
GFR: 86.81 mL/min (ref >60.00)
Glucose, Bld: 96 mg/dL (ref 70–99)
Potassium: 5.4 meq/L — ABNORMAL HIGH (ref 3.5–5.1)
Sodium: 129 meq/L — ABNORMAL LOW (ref 135–145)

## 2023-12-11 MED ORDER — ATORVASTATIN CALCIUM 20 MG PO TABS: 10.0000 mg | ORAL_TABLET | Freq: Every day | ORAL | Status: DC
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
## 2023-12-11 NOTE — Patient Instructions (Addendum)
 Continue other medications as you are doing  I will refer you to a endocrinologist, the last time we check a test called cortisol it was abnormal.  Vaccines are recommended: Shingrix  Pneumonia shot Flu shot COVID booster.   GO TO THE LAB :  Get the blood work   Your results will be posted on MyChart with my comments  Go to the front desk for the checkout Please make an appointment for a physical exam in 3 to 4 months

## 2023-12-13 DIAGNOSIS — Z95828 Presence of other vascular implants and grafts: Secondary | ICD-10-CM | POA: Insufficient documentation

## 2023-12-13 NOTE — Assessment & Plan Note (Addendum)
 Hyponatremia: On and off hyponatremia, not on diuretics, etiology unclear.   Elevated cortisol level.  Due to weight loss, fatigue, feeling unwell I will check cortisol level and it actually came back elevated.  Plan: Refer to Endo, further evaluation. High cholesterol: Elected to decrease atorvastatin  20 mg to half tablet and now  feels more energetic and has according to him,  regained muscle mass.  Also on Zetia  , last LFTs normal, check FLP. Abdominal aortic aneurysm, s/p repair, seems to be doing well. Preventive care discussed: Vaccines I recommend: Shingrix  #2; PNM 20, flu shot and COVID booster Check a PSA RTC CPX 3 to 4 months

## 2023-12-14 ENCOUNTER — Ambulatory Visit: Payer: Self-pay | Admitting: Internal Medicine

## 2023-12-14 DIAGNOSIS — E875 Hyperkalemia: Secondary | ICD-10-CM

## 2023-12-15 ENCOUNTER — Other Ambulatory Visit (HOSPITAL_BASED_OUTPATIENT_CLINIC_OR_DEPARTMENT_OTHER): Payer: Self-pay

## 2023-12-15 ENCOUNTER — Encounter: Payer: Self-pay | Admitting: Pharmacist

## 2023-12-15 ENCOUNTER — Other Ambulatory Visit: Payer: Self-pay | Admitting: Pharmacist

## 2023-12-15 MED ORDER — ATORVASTATIN CALCIUM 10 MG PO TABS
10.0000 mg | ORAL_TABLET | Freq: Every day | ORAL | 1 refills | Status: AC
  Filled 2023-12-15 – 2024-01-04 (×2): qty 90, 90d supply, fill #0
  Filled 2024-04-25: qty 90, 90d supply, fill #1

## 2023-12-15 NOTE — Progress Notes (Signed)
 Pharmacy Quality Measure Review  This patient is appearing on a report for being at risk of failing the adherence measure for cholesterol (statin) medications this calendar year.   Medication: atorvastatin  20mg   Last fill date: 07/28/2023 for 90 day supply  Patient was seen by Dr Amon 12/11/2023 - notes from that visit mentioned patient has changed dose of atorvastatin  due to not feeling well and losing muscle mass. He had been taking atorvastatin  20mg  - 0.5 tablet daily. Instructions were updated on his medication list but not at the pharmacy. Called patient to see if he needs refill - he did not need atorvastatin  but he would like to change to 10mg  in the future so he does not have to half the tablet.  Updated Rx sent in but asked pharmacy to profile until patient needs it. (He has #15 to 20 tabs of atorvastatin  20mg  on hand currently - which will last about 1 month)    Madelin Ray, PharmD Clinical Pharmacist [facility]  Population Health [phone]

## 2023-12-15 NOTE — Telephone Encounter (Signed)
 Dose of atorvastatin  was changed from 20mg  daily to 10mg  daily at appt with PCP 12/11/2023 - sent in updated Rx.

## 2024-01-04 ENCOUNTER — Other Ambulatory Visit (HOSPITAL_BASED_OUTPATIENT_CLINIC_OR_DEPARTMENT_OTHER): Payer: Self-pay

## 2024-01-21 ENCOUNTER — Other Ambulatory Visit (HOSPITAL_BASED_OUTPATIENT_CLINIC_OR_DEPARTMENT_OTHER): Payer: Self-pay

## 2024-01-21 ENCOUNTER — Encounter: Payer: Self-pay | Admitting: Internal Medicine

## 2024-01-22 ENCOUNTER — Other Ambulatory Visit (HOSPITAL_BASED_OUTPATIENT_CLINIC_OR_DEPARTMENT_OTHER): Payer: Self-pay

## 2024-02-03 ENCOUNTER — Other Ambulatory Visit (HOSPITAL_BASED_OUTPATIENT_CLINIC_OR_DEPARTMENT_OTHER): Payer: Self-pay

## 2024-02-18 ENCOUNTER — Other Ambulatory Visit

## 2024-02-25 ENCOUNTER — Other Ambulatory Visit (HOSPITAL_BASED_OUTPATIENT_CLINIC_OR_DEPARTMENT_OTHER): Payer: Self-pay

## 2024-03-08 ENCOUNTER — Other Ambulatory Visit: Payer: Self-pay

## 2024-03-08 ENCOUNTER — Other Ambulatory Visit (HOSPITAL_BASED_OUTPATIENT_CLINIC_OR_DEPARTMENT_OTHER): Payer: Self-pay

## 2024-03-31 ENCOUNTER — Other Ambulatory Visit: Payer: Self-pay | Admitting: Internal Medicine

## 2024-03-31 ENCOUNTER — Other Ambulatory Visit (HOSPITAL_BASED_OUTPATIENT_CLINIC_OR_DEPARTMENT_OTHER): Payer: Self-pay

## 2024-03-31 ENCOUNTER — Other Ambulatory Visit: Payer: Self-pay

## 2024-03-31 MED ORDER — CLINDAMYCIN PHOSPHATE 1 % EX LOTN
1.0000 | TOPICAL_LOTION | Freq: Every day | CUTANEOUS | 5 refills | Status: AC | PRN
  Filled 2024-03-31: qty 60, 30d supply, fill #0

## 2024-04-19 ENCOUNTER — Other Ambulatory Visit (INDEPENDENT_AMBULATORY_CARE_PROVIDER_SITE_OTHER)

## 2024-04-19 ENCOUNTER — Ambulatory Visit (INDEPENDENT_AMBULATORY_CARE_PROVIDER_SITE_OTHER)

## 2024-04-19 DIAGNOSIS — I10 Essential (primary) hypertension: Secondary | ICD-10-CM

## 2024-04-19 DIAGNOSIS — E875 Hyperkalemia: Secondary | ICD-10-CM | POA: Diagnosis not present

## 2024-04-19 LAB — BASIC METABOLIC PANEL WITH GFR
BUN: 15 mg/dL (ref 6–23)
CO2: 34 meq/L — ABNORMAL HIGH (ref 19–32)
Calcium: 9.3 mg/dL (ref 8.4–10.5)
Chloride: 95 meq/L — ABNORMAL LOW (ref 96–112)
Creatinine, Ser: 1.03 mg/dL (ref 0.40–1.50)
GFR: 74.64 mL/min
Glucose, Bld: 93 mg/dL (ref 70–99)
Potassium: 4.7 meq/L (ref 3.5–5.1)
Sodium: 134 meq/L — ABNORMAL LOW (ref 135–145)

## 2024-04-19 NOTE — Progress Notes (Signed)
 Pt here for Blood pressure check per PCP  Pt currently takes: Metoprolol  25 MG   Pt reports compliance with medication.  BP today @ = 142/90 L arm HR = 79  Pt stated I didn't realize this was a full visit I just wanted to see what my blood pressure is advised pt I would go talk to his PCP to see if any changes need to be made to medications, pt stated I have an appointment with him and 6 days and we can discuss it then. Spoke with PCP, Dr. Amon is aware.

## 2024-04-22 ENCOUNTER — Ambulatory Visit: Payer: Self-pay | Admitting: Internal Medicine

## 2024-04-25 ENCOUNTER — Other Ambulatory Visit: Payer: Self-pay | Admitting: Internal Medicine

## 2024-04-25 ENCOUNTER — Other Ambulatory Visit (HOSPITAL_BASED_OUTPATIENT_CLINIC_OR_DEPARTMENT_OTHER): Payer: Self-pay

## 2024-04-25 MED ORDER — METOPROLOL SUCCINATE ER 25 MG PO TB24
25.0000 mg | ORAL_TABLET | Freq: Every day | ORAL | 1 refills | Status: AC
  Filled 2024-04-25: qty 90, 90d supply, fill #0

## 2024-04-26 ENCOUNTER — Ambulatory Visit: Admitting: Internal Medicine

## 2024-04-26 ENCOUNTER — Encounter: Payer: Self-pay | Admitting: Internal Medicine

## 2024-04-26 ENCOUNTER — Other Ambulatory Visit (HOSPITAL_BASED_OUTPATIENT_CLINIC_OR_DEPARTMENT_OTHER): Payer: Self-pay

## 2024-04-26 VITALS — BP 126/80 | HR 70 | Temp 98.0°F | Resp 16 | Ht 69.0 in | Wt 151.2 lb

## 2024-04-26 DIAGNOSIS — E782 Mixed hyperlipidemia: Secondary | ICD-10-CM | POA: Diagnosis not present

## 2024-04-26 DIAGNOSIS — Z0000 Encounter for general adult medical examination without abnormal findings: Secondary | ICD-10-CM | POA: Diagnosis not present

## 2024-04-26 DIAGNOSIS — Z0001 Encounter for general adult medical examination with abnormal findings: Secondary | ICD-10-CM

## 2024-04-26 DIAGNOSIS — N5089 Other specified disorders of the male genital organs: Secondary | ICD-10-CM | POA: Diagnosis not present

## 2024-04-26 DIAGNOSIS — E291 Testicular hypofunction: Secondary | ICD-10-CM | POA: Diagnosis not present

## 2024-04-26 DIAGNOSIS — F17200 Nicotine dependence, unspecified, uncomplicated: Secondary | ICD-10-CM

## 2024-04-26 DIAGNOSIS — I10 Essential (primary) hypertension: Secondary | ICD-10-CM

## 2024-04-26 DIAGNOSIS — R739 Hyperglycemia, unspecified: Secondary | ICD-10-CM

## 2024-04-26 DIAGNOSIS — B182 Chronic viral hepatitis C: Secondary | ICD-10-CM

## 2024-04-26 LAB — CBC WITH DIFFERENTIAL/PLATELET
Basophils Absolute: 0 K/uL (ref 0.0–0.1)
Basophils Relative: 0.5 % (ref 0.0–3.0)
Eosinophils Absolute: 0.1 K/uL (ref 0.0–0.7)
Eosinophils Relative: 0.7 % (ref 0.0–5.0)
HCT: 44.7 % (ref 39.0–52.0)
Hemoglobin: 15.5 g/dL (ref 13.0–17.0)
Lymphocytes Relative: 17.7 % (ref 12.0–46.0)
Lymphs Abs: 1.4 K/uL (ref 0.7–4.0)
MCHC: 34.6 g/dL (ref 30.0–36.0)
MCV: 97.7 fl (ref 78.0–100.0)
Monocytes Absolute: 0.8 K/uL (ref 0.1–1.0)
Monocytes Relative: 10.2 % (ref 3.0–12.0)
Neutro Abs: 5.5 K/uL (ref 1.4–7.7)
Neutrophils Relative %: 70.9 % (ref 43.0–77.0)
Platelets: 274 K/uL (ref 150.0–400.0)
RBC: 4.58 Mil/uL (ref 4.22–5.81)
RDW: 12.4 % (ref 11.5–15.5)
WBC: 7.8 K/uL (ref 4.0–10.5)

## 2024-04-26 LAB — HEPATIC FUNCTION PANEL
ALT: 17 U/L (ref 3–53)
AST: 25 U/L (ref 5–37)
Albumin: 4.4 g/dL (ref 3.5–5.2)
Alkaline Phosphatase: 58 U/L (ref 39–117)
Bilirubin, Direct: 0.1 mg/dL (ref 0.1–0.3)
Total Bilirubin: 0.5 mg/dL (ref 0.2–1.2)
Total Protein: 7.3 g/dL (ref 6.0–8.3)

## 2024-04-26 LAB — TESTOSTERONE: Testosterone: 629.19 ng/dL (ref 300.00–890.00)

## 2024-04-26 LAB — HEMOGLOBIN A1C: Hgb A1c MFr Bld: 5.4 % (ref 4.6–6.5)

## 2024-04-26 MED ORDER — EZETIMIBE 10 MG PO TABS
10.0000 mg | ORAL_TABLET | Freq: Every day | ORAL | 1 refills | Status: AC
  Filled 2024-04-26: qty 90, 90d supply, fill #0

## 2024-04-26 MED ORDER — ATORVASTATIN CALCIUM 10 MG PO TABS: 10.0000 mg | ORAL_TABLET | Freq: Every day | ORAL | 1 refills | Status: AC

## 2024-04-26 NOTE — Assessment & Plan Note (Signed)
 Here for CPX Other issues Hyponatremia, elevated cortisol levels: Will see Endo 06/28/2024 Abnormal right testicle Right testicle smaller than left, occasionally tender,  ordered testicular ultrasound.  We agreed on referred to urology if necessary HTN Blood pressure well-controlled on metoprolol  High cholesterol Cholesterol levels satisfactory on current treatment. Continue atorvastatin , Zetia . Hypogonadism: Checking testosterone  levels.  Checking a CBC.  Recent PSA normal RTC 4 months

## 2024-04-26 NOTE — Assessment & Plan Note (Signed)
 Here for CPX -Td 03/2019 - pnm 23 01-2015;  prevnar 2018   -Vaccines I recommend: Shingrix  No. 2, pneumonia shot (PNM 20), COVID booster, flu shot every fall, RSV. Benefits discussed. --CCS:  2006  Cscope Dr Daneen Normal , reports a  WNL Cscope (no records)   2009 - Dr. Burnette ; s/p  Cscope 08-30-20 Dr Avram, 5 years per GI letter --Prostate ca screening:   On HRT.  DRE normal, recent PSA normal . - Lung cancer screening: Referred to screening CT - Lifestyle: Remains active - Available labs reviewed, will get LFTs  CBC A1c   testosterone 
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
## 2024-04-26 NOTE — Patient Instructions (Addendum)
 THE FOLLOWING IS A SUMMARY OF YOUR INSTRUCTION PLEASE REVIEW THEM BEFORE YOU LEAVE THE OFFICE AND LET US  KNOW IF YOU HAVE QUESTIONS    GO TO THE LAB :  Get the blood work    Go to the front desk for the checkout Please make an appointment for a checkup in 4 months  Stop by the first floor, arrange for scrotal ultrasound   ABNORMAL RIGHT TESTICLE:  Your right testicle is smaller than the left, which may be related to your previous aortic graft surgery. There is no pain or tenderness. -We have ordered a testicular ultrasound to investigate further.    HYPERTENSION: Your blood pressure is well-controlled with your current medication. -Continue taking metoprolol  as prescribed.  HYPERLIPIDEMIA: Your cholesterol levels are satisfactory with your current treatment. -Continue taking atorvastatin  as prescribed.  GENERAL HEALTH MAINTENANCE: Vaccines I recommend: Flu shot COVID booster Pneumonia shot Second Shingrix  RSV  Lung cancer screening: Recommend CT chest.

## 2024-04-27 ENCOUNTER — Ambulatory Visit (HOSPITAL_BASED_OUTPATIENT_CLINIC_OR_DEPARTMENT_OTHER)
Admission: RE | Admit: 2024-04-27 | Discharge: 2024-04-27 | Disposition: A | Discharge status: Home / Self Care | Source: Ambulatory Visit | Attending: Internal Medicine | Admitting: Internal Medicine

## 2024-04-27 DIAGNOSIS — N5089 Other specified disorders of the male genital organs: Secondary | ICD-10-CM | POA: Diagnosis present

## 2024-04-28 ENCOUNTER — Ambulatory Visit: Payer: Self-pay | Admitting: Internal Medicine

## 2024-05-03 ENCOUNTER — Other Ambulatory Visit: Payer: Self-pay

## 2024-05-03 ENCOUNTER — Other Ambulatory Visit (HOSPITAL_BASED_OUTPATIENT_CLINIC_OR_DEPARTMENT_OTHER): Payer: Self-pay

## 2024-05-04 ENCOUNTER — Other Ambulatory Visit (HOSPITAL_BASED_OUTPATIENT_CLINIC_OR_DEPARTMENT_OTHER): Payer: Self-pay

## 2024-06-28 ENCOUNTER — Ambulatory Visit: Admitting: "Endocrinology

## 2024-08-24 ENCOUNTER — Ambulatory Visit
